# Patient Record
Sex: Female | Born: 1965
Health system: Southern US, Community
[De-identification: ages and names within clinical notes are randomized; demographics above are authoritative.]

## PROBLEM LIST (undated history)

## (undated) DIAGNOSIS — E229 Hyperfunction of pituitary gland, unspecified: Secondary | ICD-10-CM | POA: Insufficient documentation

## (undated) DIAGNOSIS — B019 Varicella without complication: Secondary | ICD-10-CM

## (undated) DIAGNOSIS — E785 Hyperlipidemia, unspecified: Secondary | ICD-10-CM

## (undated) DIAGNOSIS — G4733 Obstructive sleep apnea (adult) (pediatric): Secondary | ICD-10-CM

## (undated) DIAGNOSIS — I5189 Other ill-defined heart diseases: Secondary | ICD-10-CM

## (undated) DIAGNOSIS — I10 Essential (primary) hypertension: Secondary | ICD-10-CM

## (undated) DIAGNOSIS — G479 Sleep disorder, unspecified: Secondary | ICD-10-CM | POA: Insufficient documentation

## (undated) DIAGNOSIS — D649 Anemia, unspecified: Secondary | ICD-10-CM

## (undated) DIAGNOSIS — E559 Vitamin D deficiency, unspecified: Secondary | ICD-10-CM | POA: Insufficient documentation

## (undated) DIAGNOSIS — D332 Benign neoplasm of brain, unspecified: Secondary | ICD-10-CM

## (undated) DIAGNOSIS — D353 Benign neoplasm of craniopharyngeal duct: Secondary | ICD-10-CM

## (undated) DIAGNOSIS — D352 Benign neoplasm of pituitary gland: Secondary | ICD-10-CM | POA: Insufficient documentation

## (undated) HISTORY — DX: Varicella without complication: B01.9

## (undated) HISTORY — DX: Obstructive sleep apnea (adult) (pediatric): G47.33

## (undated) HISTORY — DX: Hyperlipidemia, unspecified: E78.5

## (undated) HISTORY — DX: Other ill-defined heart diseases: I51.89

## (undated) HISTORY — DX: Benign neoplasm of brain, unspecified: D33.2

## (undated) HISTORY — PX: WISDOM TOOTH EXTRACTION: SHX21

---

## 1994-02-01 DIAGNOSIS — D332 Benign neoplasm of brain, unspecified: Secondary | ICD-10-CM

## 1994-02-01 HISTORY — DX: Benign neoplasm of brain, unspecified: D33.2

## 1994-02-01 HISTORY — PX: TRANSPHENOIDAL PITUITARY RESECTION: SHX2572

## 1994-02-01 HISTORY — PX: CRANIECTOMY / CRANIOTOMY FOR EXCISION OF BRAIN TUMOR: SUR320

## 2011-08-10 DIAGNOSIS — D352 Benign neoplasm of pituitary gland: Secondary | ICD-10-CM | POA: Insufficient documentation

## 2015-11-10 DIAGNOSIS — E559 Vitamin D deficiency, unspecified: Secondary | ICD-10-CM | POA: Diagnosis not present

## 2015-11-10 DIAGNOSIS — D352 Benign neoplasm of pituitary gland: Secondary | ICD-10-CM | POA: Diagnosis not present

## 2015-11-14 DIAGNOSIS — D352 Benign neoplasm of pituitary gland: Secondary | ICD-10-CM | POA: Diagnosis not present

## 2015-11-14 DIAGNOSIS — E221 Hyperprolactinemia: Secondary | ICD-10-CM | POA: Diagnosis not present

## 2015-11-14 DIAGNOSIS — D353 Benign neoplasm of craniopharyngeal duct: Secondary | ICD-10-CM | POA: Diagnosis not present

## 2015-11-14 DIAGNOSIS — E559 Vitamin D deficiency, unspecified: Secondary | ICD-10-CM | POA: Diagnosis not present

## 2016-02-09 DIAGNOSIS — K080 Exfoliation of teeth due to systemic causes: Secondary | ICD-10-CM | POA: Diagnosis not present

## 2016-09-24 DIAGNOSIS — D352 Benign neoplasm of pituitary gland: Secondary | ICD-10-CM | POA: Diagnosis not present

## 2016-09-24 DIAGNOSIS — E229 Hyperfunction of pituitary gland, unspecified: Secondary | ICD-10-CM | POA: Diagnosis not present

## 2016-09-24 DIAGNOSIS — E559 Vitamin D deficiency, unspecified: Secondary | ICD-10-CM | POA: Diagnosis not present

## 2016-09-30 DIAGNOSIS — E221 Hyperprolactinemia: Secondary | ICD-10-CM | POA: Diagnosis not present

## 2016-09-30 DIAGNOSIS — D353 Benign neoplasm of craniopharyngeal duct: Secondary | ICD-10-CM | POA: Diagnosis not present

## 2016-09-30 DIAGNOSIS — D352 Benign neoplasm of pituitary gland: Secondary | ICD-10-CM | POA: Diagnosis not present

## 2016-09-30 DIAGNOSIS — E559 Vitamin D deficiency, unspecified: Secondary | ICD-10-CM | POA: Diagnosis not present

## 2016-11-18 DIAGNOSIS — K080 Exfoliation of teeth due to systemic causes: Secondary | ICD-10-CM | POA: Diagnosis not present

## 2017-02-28 DIAGNOSIS — D352 Benign neoplasm of pituitary gland: Secondary | ICD-10-CM | POA: Diagnosis not present

## 2017-02-28 DIAGNOSIS — E221 Hyperprolactinemia: Secondary | ICD-10-CM | POA: Diagnosis not present

## 2017-03-04 DIAGNOSIS — I10 Essential (primary) hypertension: Secondary | ICD-10-CM | POA: Diagnosis not present

## 2017-03-04 DIAGNOSIS — D352 Benign neoplasm of pituitary gland: Secondary | ICD-10-CM | POA: Diagnosis not present

## 2017-03-04 DIAGNOSIS — E559 Vitamin D deficiency, unspecified: Secondary | ICD-10-CM | POA: Diagnosis not present

## 2017-03-04 DIAGNOSIS — E221 Hyperprolactinemia: Secondary | ICD-10-CM | POA: Diagnosis not present

## 2017-06-07 ENCOUNTER — Ambulatory Visit: Payer: Federal, State, Local not specified - PPO | Admitting: Nurse Practitioner

## 2017-06-07 ENCOUNTER — Encounter: Payer: Self-pay | Admitting: Nurse Practitioner

## 2017-06-07 ENCOUNTER — Other Ambulatory Visit (HOSPITAL_COMMUNITY)
Admission: RE | Admit: 2017-06-07 | Discharge: 2017-06-07 | Disposition: A | Discharge status: Home / Self Care | Payer: Federal, State, Local not specified - PPO | Source: Ambulatory Visit | Attending: Nurse Practitioner | Admitting: Nurse Practitioner

## 2017-06-07 VITALS — BP 136/96 | HR 80 | Temp 97.5°F | Ht 63.0 in | Wt 308.0 lb

## 2017-06-07 DIAGNOSIS — Z0001 Encounter for general adult medical examination with abnormal findings: Secondary | ICD-10-CM | POA: Diagnosis not present

## 2017-06-07 DIAGNOSIS — Z136 Encounter for screening for cardiovascular disorders: Secondary | ICD-10-CM

## 2017-06-07 DIAGNOSIS — R030 Elevated blood-pressure reading, without diagnosis of hypertension: Secondary | ICD-10-CM | POA: Insufficient documentation

## 2017-06-07 DIAGNOSIS — Z13220 Encounter for screening for lipoid disorders: Secondary | ICD-10-CM

## 2017-06-07 DIAGNOSIS — Z124 Encounter for screening for malignant neoplasm of cervix: Secondary | ICD-10-CM

## 2017-06-07 DIAGNOSIS — Z114 Encounter for screening for human immunodeficiency virus [HIV]: Secondary | ICD-10-CM | POA: Diagnosis not present

## 2017-06-07 DIAGNOSIS — Z1211 Encounter for screening for malignant neoplasm of colon: Secondary | ICD-10-CM

## 2017-06-07 DIAGNOSIS — Z1231 Encounter for screening mammogram for malignant neoplasm of breast: Secondary | ICD-10-CM

## 2017-06-07 LAB — COMPREHENSIVE METABOLIC PANEL
ALBUMIN: 3.5 g/dL (ref 3.5–5.2)
ALT: 12 U/L (ref 0–35)
AST: 15 U/L (ref 0–37)
Alkaline Phosphatase: 80 U/L (ref 39–117)
BILIRUBIN TOTAL: 0.8 mg/dL (ref 0.2–1.2)
BUN: 7 mg/dL (ref 6–23)
CALCIUM: 9 mg/dL (ref 8.4–10.5)
CO2: 25 meq/L (ref 19–32)
CREATININE: 0.73 mg/dL (ref 0.40–1.20)
Chloride: 104 mEq/L (ref 96–112)
GFR: 107.52 mL/min (ref >60.00)
Glucose, Bld: 88 mg/dL (ref 70–99)
Potassium: 3.6 mEq/L (ref 3.5–5.1)
Sodium: 138 mEq/L (ref 135–145)
Total Protein: 7.2 g/dL (ref 6.0–8.3)

## 2017-06-07 LAB — CBC
HCT: 39.2 % (ref 36.0–46.0)
HEMOGLOBIN: 12.5 g/dL (ref 12.0–15.0)
MCHC: 31.8 g/dL (ref 30.0–36.0)
MCV: 88 fl (ref 78.0–100.0)
PLATELETS: 379 10*3/uL (ref 150.0–400.0)
RBC: 4.45 Mil/uL (ref 3.87–5.11)
RDW: 16.7 % — ABNORMAL HIGH (ref 11.5–15.5)
WBC: 12.8 10*3/uL — ABNORMAL HIGH (ref 4.0–10.5)

## 2017-06-07 LAB — LIPID PANEL
CHOLESTEROL: 182 mg/dL (ref 0–200)
HDL: 54.2 mg/dL (ref >39.00)
LDL CALC: 111 mg/dL — AB (ref 0–99)
NonHDL: 127.65
TRIGLYCERIDES: 83 mg/dL (ref 0.0–149.0)
Total CHOL/HDL Ratio: 3
VLDL: 16.6 mg/dL (ref 0.0–40.0)

## 2017-06-07 LAB — TSH: TSH: 1.06 u[IU]/mL (ref 0.35–4.50)

## 2017-06-07 NOTE — Progress Notes (Signed)
Subjective:    Patient ID: Sherry [name], female    DOB: 10-Nov-1965, 52 y.o.   MRN: [id number]  Patient presents today for complete physical  HPI   weight gain over the last 2years. Does not check BP at home.   Hx of elevated BP: Does not check BP at home. Does not maintain DASH diet.  Immunizations: (TDAP, Hep C screen, Pneumovax, Influenza, zoster)  Health Maintenance  Topic Date Due  . HIV Screening  02/04/1980  . Pap Smear  02/03/1986  . Mammogram  02/04/2015  . Colon Cancer Screening  02/04/2015  . Flu Shot  09/01/2017  . Tetanus Vaccine  04/02/2018   Diet:vegetarian (no red beef due to inability to digest meat) , reports food sensitivity but no formal testing done in past  Weight:  Wt Readings from Last 3 Encounters:  06/07/17 (!) 308 lb (139.7 kg)   Exercise:none.  Fall Risk: Fall Risk  06/07/2017  Falls in the past year? No   Home Safety:home with husband.  Depression/Suicide: Depression screen [facility] 2/9 06/07/2017  Decreased Interest 0  Down, Depressed, Hopeless 0  PHQ - 2 Score 0   Vision:needed, will schedule, chronic dry eyes, use of omega 3  Dental: done every 86months.  Advanced Directive: Advanced Directives 06/07/2017  Does Patient Have a Medical Advance Directive? Yes  Type of Paramedic of [place];Living will  Copy of [place] in Chart? No - copy requested   Medications and allergies reviewed with patient and updated if appropriate.  Patient Active Problem List   Diagnosis Date Noted  . Elevated BP without diagnosis of hypertension 06/07/2017  . Benign neoplasm of pituitary gland and craniopharyngeal duct ([place]) 02/01/1898  . Hyperpituitarism ([place]) 02/01/1898    Current Outpatient Medications on File Prior to Visit  Medication Sig Dispense Refill  . bromocriptine (PARLODEL) 2.5 MG tablet TAKE ONE (1) TABLET BY MOUTH ONCE DAILY    . Cholecalciferol (VITAMIN D3) 5000 units TABS Take by mouth.      . Multiple Vitamins-Minerals (MULTIVITAMIN ADULT PO) Take by mouth.    . Omega-3 Fatty Acids (FISH OIL CONCENTRATE) 300 MG CAPS Take 500 mg by mouth.     No current facility-administered medications on file prior to visit.    Past Medical History:  Diagnosis Date  . Brain tumor (benign) ([place]) 1996   benign pituitary adenoma  . Chickenpox    Social History   Socioeconomic History  . Marital status: Married    Spouse name: Not on file  . Number of children: 0  . Years of education: Not on file  . Highest education level: Not on file  Occupational History  . Not on file  Social Needs  . Financial resource strain: Not on file  . Food insecurity:    Worry: Not on file    Inability: Not on file  . Transportation needs:    Medical: Not on file    Non-medical: Not on file  Tobacco Use  . Smoking status: Never Smoker  . Smokeless tobacco: Never Used  Substance and Sexual Activity  . Alcohol use: Never    Frequency: Never  . Drug use: Never  . Sexual activity: Yes  Lifestyle  . Physical activity:    Days per week: Not on file    Minutes per session: Not on file  . Stress: Not on file  Relationships  . Social connections:    Talks on phone: Not on file  Gets together: Not on file    Attends religious service: Not on file    Active member of club or organization: Not on file    Attends meetings of clubs or organizations: Not on file    Relationship status: Not on file  Other Topics Concern  . Not on file  Social History Narrative  . Not on file    Family History  Problem Relation Age of Onset  . Heart attack Father 55       result from blood clot  . Pulmonary embolism Father         Review of Systems  Constitutional: Negative for fever, malaise/fatigue and weight loss.  HENT: Negative for congestion and sore throat.   Eyes:       Negative for visual changes  Respiratory: Negative for cough and shortness of breath.   Cardiovascular: Negative for chest  pain, palpitations and leg swelling.  Gastrointestinal: Negative for blood in stool, constipation, diarrhea and heartburn.  Genitourinary: Negative for dysuria, frequency and urgency.  Musculoskeletal: Negative for falls, joint pain and myalgias.  Skin: Negative for rash.  Neurological: Negative for dizziness, sensory change and headaches.  Endo/Heme/Allergies: Does not bruise/bleed easily.  Psychiatric/Behavioral: Negative for depression, substance abuse and suicidal ideas. The patient is not nervous/anxious.     Objective:   Vitals:   06/07/17 1011  BP: (!) 136/96  Pulse: 80  Temp: (!) 97.5 F (36.4 C)  SpO2: 100%    Body mass index is 54.56 kg/m.   Physical Examination:  Physical Exam  Constitutional: She is oriented to person, place, and time. No distress.  HENT:  Right Ear: Tympanic membrane, external ear and ear canal normal.  Left Ear: Tympanic membrane, external ear and ear canal normal.  Nose: Nose normal.  Mouth/Throat: Uvula is midline, oropharynx is clear and moist and mucous membranes are normal. Normal dentition. No oropharyngeal exudate.  Eyes: Pupils are equal, round, and reactive to light. Conjunctivae and EOM are normal. No scleral icterus.  Neck: Normal range of motion. Neck supple. No thyromegaly present.  Cardiovascular: Normal rate, regular rhythm, normal heart sounds and intact distal pulses.  Pulmonary/Chest: Effort normal and breath sounds normal. She exhibits no mass and no tenderness. Right breast exhibits no inverted nipple, no mass, no nipple discharge and no skin change. Left breast exhibits no inverted nipple, no mass, no nipple discharge and no skin change. Breasts are symmetrical.  Abdominal: Soft. Bowel sounds are normal. She exhibits no distension. There is no tenderness.  Genitourinary: Rectum normal and vagina normal. Pelvic exam was performed with patient supine. There is no tenderness on the right labia. There is no tenderness on the left  labia. Cervix exhibits no motion tenderness. No vaginal discharge found.  Musculoskeletal: Normal range of motion. She exhibits no edema or tenderness.  Lymphadenopathy:    She has no cervical adenopathy. No inguinal adenopathy noted on the right or left side.  Neurological: She is alert and oriented to person, place, and time.  Skin: Skin is warm and dry.  Psychiatric: She has a normal mood and affect. Judgment normal.  Vitals reviewed.   ASSESSMENT and PLAN:  Keymiah was seen today for establish care.  Diagnoses and all orders for this visit:  Encounter for preventative adult health care exam with abnormal findings -     CBC -     Comprehensive metabolic panel -     TSH -     Lipid panel -  MM 3D SCREEN BREAST BILATERAL; Future -     Cytology - PAP -     HIV antibody  Encounter for lipid screening for cardiovascular disease -     Lipid panel  Breast cancer screening by mammogram -     MM 3D SCREEN BREAST BILATERAL; Future  Cervical cancer screening -     Cytology - PAP  Encounter for screening for human immunodeficiency virus (HIV) -     HIV antibody  Colon cancer screening -     Ambulatory referral to Gastroenterology  Elevated BP without diagnosis of hypertension   No problem-specific Assessment & Plan notes found for this encounter.     Follow up: Return in about 2 months (around 08/07/2017) for elevated BP re eval.  Wilfred Lacy, NP

## 2017-06-07 NOTE — Patient Instructions (Addendum)
Check BP 3 times a week (morning) and record. Bring BP readings to next office visit.  Return to office sooner if BP>140/80 for more than 2days.  Maintain DASH diet  Go to lab for blood draw.  You will be contacted to schedule mammogram and colonoscpy.  DASH Eating Plan DASH stands for "Dietary Approaches to Stop Hypertension." The DASH eating plan is a healthy eating plan that has been shown to reduce high blood pressure (hypertension). It may also reduce your risk for type 2 diabetes, heart disease, and stroke. The DASH eating plan may also help with weight loss. What are tips for following this plan? General guidelines  Avoid eating more than 2,300 mg (milligrams) of salt (sodium) a day. If you have hypertension, you may need to reduce your sodium intake to 1,500 mg a day.  Limit alcohol intake to no more than 1 drink a day for nonpregnant women and 2 drinks a day for men. One drink equals 12 oz of beer, 5 oz of wine, or 1 oz of hard liquor.  Work with your health care provider to maintain a healthy body weight or to lose weight. Ask what an ideal weight is for you.  Get at least 30 minutes of exercise that causes your heart to beat faster (aerobic exercise) most days of the week. Activities may include walking, swimming, or biking.  Work with your health care provider or diet and nutrition specialist (dietitian) to adjust your eating plan to your individual calorie needs. Reading food labels  Check food labels for the amount of sodium per serving. Choose foods with less than 5 percent of the Daily Value of sodium. Generally, foods with less than 300 mg of sodium per serving fit into this eating plan.  To find whole grains, look for the word "whole" as the first word in the ingredient list. Shopping  Buy products labeled as "low-sodium" or "no salt added."  Buy fresh foods. Avoid canned foods and premade or frozen meals. Cooking  Avoid adding salt when cooking. Use salt-free  seasonings or herbs instead of table salt or sea salt. Check with your health care provider or pharmacist before using salt substitutes.  Do not fry foods. Cook foods using healthy methods such as baking, boiling, grilling, and broiling instead.  Cook with heart-healthy oils, such as olive, canola, soybean, or sunflower oil. Meal planning   Eat a balanced diet that includes: ? 5 or more servings of fruits and vegetables each day. At each meal, try to fill half of your plate with fruits and vegetables. ? Up to 6-8 servings of whole grains each day. ? Less than 6 oz of lean meat, poultry, or fish each day. A 3-oz serving of meat is about the same size as a deck of cards. One egg equals 1 oz. ? 2 servings of low-fat dairy each day. ? A serving of nuts, seeds, or beans 5 times each week. ? Heart-healthy fats. Healthy fats called Omega-3 fatty acids are found in foods such as flaxseeds and coldwater fish, like sardines, salmon, and mackerel.  Limit how much you eat of the following: ? Canned or prepackaged foods. ? Food that is high in trans fat, such as fried foods. ? Food that is high in saturated fat, such as fatty meat. ? Sweets, desserts, sugary drinks, and other foods with added sugar. ? Full-fat dairy products.  Do not salt foods before eating.  Try to eat at least 2 vegetarian meals each week.  Eat  more home-cooked food and less restaurant, buffet, and fast food.  When eating at a restaurant, ask that your food be prepared with less salt or no salt, if possible. What foods are recommended? The items listed may not be a complete list. Talk with your dietitian about what dietary choices are best for you. Grains Whole-grain or whole-wheat bread. Whole-grain or whole-wheat pasta. Brown rice. [place]. Bulgur. Whole-grain and low-sodium cereals. Pita bread. Low-fat, low-sodium crackers. Whole-wheat flour tortillas. Vegetables Fresh or frozen vegetables (raw, steamed, roasted,  or grilled). Low-sodium or reduced-sodium tomato and vegetable juice. Low-sodium or reduced-sodium tomato sauce and tomato paste. Low-sodium or reduced-sodium canned vegetables. Fruits All fresh, dried, or frozen fruit. Canned fruit in natural juice (without added sugar). Meat and other protein foods Skinless chicken or [place]. Ground chicken or [place]. Pork with fat trimmed off. Fish and seafood. Egg whites. Dried beans, peas, or lentils. Unsalted nuts, nut butters, and seeds. Unsalted canned beans. Lean cuts of beef with fat trimmed off. Low-sodium, lean deli meat. Dairy Low-fat (1%) or fat-free (skim) milk. Fat-free, low-fat, or reduced-fat cheeses. Nonfat, low-sodium ricotta or cottage cheese. Low-fat or nonfat yogurt. Low-fat, low-sodium cheese. Fats and oils Soft margarine without trans fats. Vegetable oil. Low-fat, reduced-fat, or light mayonnaise and salad dressings (reduced-sodium). Canola, safflower, olive, soybean, and sunflower oils. Avocado. Seasoning and other foods Herbs. Spices. Seasoning mixes without salt. Unsalted popcorn and pretzels. Fat-free sweets. What foods are not recommended? The items listed may not be a complete list. Talk with your dietitian about what dietary choices are best for you. Grains Baked goods made with fat, such as croissants, muffins, or some breads. Dry pasta or rice meal packs. Vegetables Creamed or fried vegetables. Vegetables in a cheese sauce. Regular canned vegetables (not low-sodium or reduced-sodium). Regular canned tomato sauce and paste (not low-sodium or reduced-sodium). Regular tomato and vegetable juice (not low-sodium or reduced-sodium). Angie Fava. Olives. Fruits Canned fruit in a light or heavy syrup. Fried fruit. Fruit in cream or butter sauce. Meat and other protein foods Fatty cuts of meat. Ribs. Fried meat. Berniece Salines. Sausage. Bologna and other processed lunch meats. Salami. Fatback. Hotdogs. Bratwurst. Salted nuts and seeds. Canned beans  with added salt. Canned or smoked fish. Whole eggs or egg yolks. Chicken or [place] with skin. Dairy Whole or 2% milk, cream, and half-and-half. Whole or full-fat cream cheese. Whole-fat or sweetened yogurt. Full-fat cheese. Nondairy creamers. Whipped toppings. Processed cheese and cheese spreads. Fats and oils Butter. Stick margarine. Lard. Shortening. Ghee. Bacon fat. Tropical oils, such as coconut, palm kernel, or palm oil. Seasoning and other foods Salted popcorn and pretzels. Onion salt, garlic salt, seasoned salt, table salt, and sea salt. Worcestershire sauce. Tartar sauce. Barbecue sauce. Teriyaki sauce. Soy sauce, including reduced-sodium. Steak sauce. Canned and packaged gravies. Fish sauce. Oyster sauce. Cocktail sauce. Horseradish that you find on the shelf. Ketchup. Mustard. Meat flavorings and tenderizers. Bouillon cubes. Hot sauce and Tabasco sauce. Premade or packaged marinades. Premade or packaged taco seasonings. Relishes. Regular salad dressings. Where to find more information:  National Heart, Lung, and [place]: https://wilson-eaton.com/  American Heart Association: www.heart.org Summary  The DASH eating plan is a healthy eating plan that has been shown to reduce high blood pressure (hypertension). It may also reduce your risk for type 2 diabetes, heart disease, and stroke.  With the DASH eating plan, you should limit salt (sodium) intake to 2,300 mg a day. If you have hypertension, you may need to reduce your sodium intake to  1,500 mg a day.  When on the DASH eating plan, aim to eat more fresh fruits and vegetables, whole grains, lean proteins, low-fat dairy, and heart-healthy fats.  Work with your health care provider or diet and nutrition specialist (dietitian) to adjust your eating plan to your individual calorie needs. This information is not intended to replace advice given to you by your health care provider. Make sure you discuss any questions you have with your health  care provider. Document Released: 01/07/2011 Document Revised: 01/12/2016 Document Reviewed: 01/12/2016 Elsevier Interactive Patient Education  [employer].

## 2017-06-08 LAB — HIV ANTIBODY (ROUTINE TESTING W REFLEX): HIV: NONREACTIVE

## 2017-06-09 LAB — CYTOLOGY - PAP
DIAGNOSIS: NEGATIVE
HPV (WINDOPATH): NOT DETECTED

## 2017-08-09 ENCOUNTER — Ambulatory Visit: Payer: Federal, State, Local not specified - PPO | Admitting: Nurse Practitioner

## 2017-08-25 ENCOUNTER — Other Ambulatory Visit: Payer: Self-pay | Admitting: Nurse Practitioner

## 2017-08-25 DIAGNOSIS — Z1231 Encounter for screening mammogram for malignant neoplasm of breast: Secondary | ICD-10-CM

## 2017-08-30 ENCOUNTER — Ambulatory Visit: Payer: Federal, State, Local not specified - PPO | Admitting: Nurse Practitioner

## 2017-09-26 ENCOUNTER — Ambulatory Visit: Payer: Federal, State, Local not specified - PPO | Admitting: Nurse Practitioner

## 2017-09-26 ENCOUNTER — Encounter: Payer: Self-pay | Admitting: Nurse Practitioner

## 2017-09-26 ENCOUNTER — Telehealth: Payer: Self-pay | Admitting: Nurse Practitioner

## 2017-09-26 VITALS — BP 160/98 | HR 86 | Temp 97.5°F | Ht 63.0 in | Wt 308.0 lb

## 2017-09-26 DIAGNOSIS — D353 Benign neoplasm of craniopharyngeal duct: Secondary | ICD-10-CM | POA: Diagnosis not present

## 2017-09-26 DIAGNOSIS — N926 Irregular menstruation, unspecified: Secondary | ICD-10-CM

## 2017-09-26 DIAGNOSIS — D352 Benign neoplasm of pituitary gland: Secondary | ICD-10-CM | POA: Diagnosis not present

## 2017-09-26 DIAGNOSIS — I10 Essential (primary) hypertension: Secondary | ICD-10-CM

## 2017-09-26 LAB — POCT URINE PREGNANCY: PREG TEST UR: NEGATIVE

## 2017-09-26 MED ORDER — TRIAMTERENE-HCTZ 37.5-25 MG PO TABS: 0.5000 | ORAL_TABLET | Freq: Every day | ORAL | 1 refills | Status: DC

## 2017-09-26 MED ORDER — TRIAMTERENE-HCTZ 37.5-25 MG PO TABS: 1.0000 | ORAL_TABLET | Freq: Every day | ORAL | 1 refills | Status: DC

## 2017-09-26 NOTE — Telephone Encounter (Signed)
Copied from [place] [id number]. Topic: Quick Communication - Rx Refill/Question >> Sep 26, 2017  3:54 PM Nils Flack, [name] wrote: Medication: triamterene-hydrochlorothiazide (MAXZIDE-25) 37.5-25 MG tablet Has the patient contacted their pharmacy? No. (Agent: If no, request that the patient contact the pharmacy for the refill.) (Agent: If yes, when and what did the pharmacy advise?)  Preferred Pharmacy (with phone number or street name): [facility] called - med was called in 2 times with different directions.  First, was scribed in with directions of 1 per day. Then was scribed in with directions of 1/2 per day. Pharm would like clarification  Please call [phone]    Agent: Please be advised that RX refills may take up to 3 business days. We ask that you follow-up with your pharmacy.

## 2017-09-26 NOTE — Telephone Encounter (Signed)
Spoke with the pharmacy, pt need to be on Maxzide  37.5-25 mg to take 0.5 (half) tab daily.

## 2017-09-26 NOTE — Progress Notes (Signed)
Subjective:  Patient ID: Sherry [name], female    DOB: 12-Jul-1965  Age: 52 y.o. MRN: [id number]  CC: Follow-up (2 folow up BP/ forgot the BP reading (running around 160/94). sleeping issue consult, use melatonin PRN but still not helping at times. )  HPI  HTN: Elevated with home readings: 150s-160s/90s. BP Readings from Last 3 Encounters:  09/26/17 (!) 160/98  06/07/17 (!) 136/96   Reviewed past Medical, Social and Family history today.  Outpatient Medications Prior to Visit  Medication Sig Dispense Refill  . bromocriptine (PARLODEL) 2.5 MG tablet TAKE ONE (1) TABLET BY MOUTH ONCE DAILY    . Cholecalciferol (VITAMIN D3) 5000 units TABS Take by mouth.    . MELATONIN PO Take by mouth. Help with sleep PRN    . Multiple Vitamins-Minerals (MULTIVITAMIN ADULT PO) Take by mouth.    . Omega-3 Fatty Acids (FISH OIL CONCENTRATE) 300 MG CAPS Take 500 mg by mouth.     No facility-administered medications prior to visit.     ROS Review of Systems  Constitutional: Negative for malaise/fatigue.  HENT: Negative for hearing loss.   Eyes: Negative for blurred vision.  Respiratory: Negative for cough and sputum production.   Cardiovascular: Positive for leg swelling. Negative for chest pain and palpitations.  Musculoskeletal: Negative for falls.  Neurological: Positive for dizziness. Negative for sensory change and headaches.  Psychiatric/Behavioral: Negative for substance abuse. The patient does not have insomnia.      Objective:  BP (!) 160/98   Pulse 86   Temp (!) 97.5 F (36.4 C) (Oral)   Ht 5\' 3"  (1.6 m)   Wt (!) 308 lb (139.7 kg)   LMP 09/18/2017   SpO2 96%   BMI 54.56 kg/m   BP Readings from Last 3 Encounters:  09/26/17 (!) 160/98  06/07/17 (!) 136/96    Wt Readings from Last 3 Encounters:  09/26/17 (!) 308 lb (139.7 kg)  06/07/17 (!) 308 lb (139.7 kg)    Physical Exam  Constitutional: She appears well-developed and well-nourished.  Neck: Normal range of motion.  Neck supple. No JVD present. No thyromegaly present.  Cardiovascular: Normal rate, regular rhythm and intact distal pulses. Exam reveals gallop.  Pulmonary/Chest: Effort normal and breath sounds normal.  Musculoskeletal: She exhibits edema.  Psychiatric: She has a normal mood and affect. Her behavior is normal.  Vitals reviewed.   Lab Results  Component Value Date   WBC 12.8 (H) 06/07/2017   HGB 12.5 06/07/2017   HCT 39.2 06/07/2017   PLT 379.0 06/07/2017   GLUCOSE 88 06/07/2017   CHOL 182 06/07/2017   TRIG 83.0 06/07/2017   HDL 54.20 06/07/2017   LDLCALC 111 (H) 06/07/2017   ALT 12 06/07/2017   AST 15 06/07/2017   NA 138 06/07/2017   K 3.6 06/07/2017   CL 104 06/07/2017   CREATININE 0.73 06/07/2017   BUN 7 06/07/2017   CO2 25 06/07/2017   TSH 1.06 06/07/2017     Assessment & Plan:   Sherry [name] was seen today for follow-up.  Diagnoses and all orders for this visit:  Essential hypertension -     Discontinue: triamterene-hydrochlorothiazide (MAXZIDE-25) 37.5-25 MG tablet; Take 1 tablet by mouth daily. -     triamterene-hydrochlorothiazide (MAXZIDE-25) 37.5-25 MG tablet; Take 0.5 tablets by mouth daily.  Irregular menstrual cycle -     POCT urine pregnancy   I have changed Sherry [name] triamterene-hydrochlorothiazide. I am also having her maintain her bromocriptine, Vitamin D3, FISH OIL CONCENTRATE, Multiple Vitamins-Minerals (MULTIVITAMIN  ADULT PO), and MELATONIN PO.  Meds ordered this encounter  Medications  . DISCONTD: triamterene-hydrochlorothiazide (MAXZIDE-25) 37.5-25 MG tablet    Sig: Take 1 tablet by mouth daily.    Dispense:  30 tablet    Refill:  1    Order Specific Question:   Supervising Provider    Answer:   Lucille Passy [[id number]]  . triamterene-hydrochlorothiazide (MAXZIDE-25) 37.5-25 MG tablet    Sig: Take 0.5 tablets by mouth daily.    Dispense:  30 tablet    Refill:  1    Order Specific Question:   Supervising Provider    Answer:   Lucille Passy [[id number]]    Follow-up: Return in about 4 weeks (around 10/24/2017) for HTN.  Wilfred Lacy, NP

## 2017-09-26 NOTE — Patient Instructions (Signed)
DASH Eating Plan DASH stands for "Dietary Approaches to Stop Hypertension." The DASH eating plan is a healthy eating plan that has been shown to reduce high blood pressure (hypertension). It may also reduce your risk for type 2 diabetes, heart disease, and stroke. The DASH eating plan may also help with weight loss. What are tips for following this plan? General guidelines  Avoid eating more than 2,300 mg (milligrams) of salt (sodium) a day. If you have hypertension, you may need to reduce your sodium intake to 1,500 mg a day.  Limit alcohol intake to no more than 1 drink a day for nonpregnant women and 2 drinks a day for men. One drink equals 12 oz of beer, 5 oz of wine, or 1 oz of hard liquor.  Work with your health care provider to maintain a healthy body weight or to lose weight. Ask what an ideal weight is for you.  Get at least 30 minutes of exercise that causes your heart to beat faster (aerobic exercise) most days of the week. Activities may include walking, swimming, or biking.  Work with your health care provider or diet and nutrition specialist (dietitian) to adjust your eating plan to your individual calorie needs. Reading food labels  Check food labels for the amount of sodium per serving. Choose foods with less than 5 percent of the Daily Value of sodium. Generally, foods with less than 300 mg of sodium per serving fit into this eating plan.  To find whole grains, look for the word "whole" as the first word in the ingredient list. Shopping  Buy products labeled as "low-sodium" or "no salt added."  Buy fresh foods. Avoid canned foods and premade or frozen meals. Cooking  Avoid adding salt when cooking. Use salt-free seasonings or herbs instead of table salt or sea salt. Check with your health care provider or pharmacist before using salt substitutes.  Do not fry foods. Cook foods using healthy methods such as baking, boiling, grilling, and broiling instead.  Cook with  heart-healthy oils, such as olive, canola, soybean, or sunflower oil. Meal planning   Eat a balanced diet that includes: ? 5 or more servings of fruits and vegetables each day. At each meal, try to fill half of your plate with fruits and vegetables. ? Up to 6-8 servings of whole grains each day. ? Less than 6 oz of lean meat, poultry, or fish each day. A 3-oz serving of meat is about the same size as a deck of cards. One egg equals 1 oz. ? 2 servings of low-fat dairy each day. ? A serving of nuts, seeds, or beans 5 times each week. ? Heart-healthy fats. Healthy fats called Omega-3 fatty acids are found in foods such as flaxseeds and coldwater fish, like sardines, salmon, and mackerel.  Limit how much you eat of the following: ? Canned or prepackaged foods. ? Food that is high in trans fat, such as fried foods. ? Food that is high in saturated fat, such as fatty meat. ? Sweets, desserts, sugary drinks, and other foods with added sugar. ? Full-fat dairy products.  Do not salt foods before eating.  Try to eat at least 2 vegetarian meals each week.  Eat more home-cooked food and less restaurant, buffet, and fast food.  When eating at a restaurant, ask that your food be prepared with less salt or no salt, if possible. What foods are recommended? The items listed may not be a complete list. Talk with your dietitian about what   dietary choices are best for you. Grains Whole-grain or whole-wheat bread. Whole-grain or whole-wheat pasta. Brown rice. Oatmeal. Quinoa. Bulgur. Whole-grain and low-sodium cereals. Pita bread. Low-fat, low-sodium crackers. Whole-wheat flour tortillas. Vegetables Fresh or frozen vegetables (raw, steamed, roasted, or grilled). Low-sodium or reduced-sodium tomato and vegetable juice. Low-sodium or reduced-sodium tomato sauce and tomato paste. Low-sodium or reduced-sodium canned vegetables. Fruits All fresh, dried, or frozen fruit. Canned fruit in natural juice (without  added sugar). Meat and other protein foods Skinless chicken or turkey. Ground chicken or turkey. Pork with fat trimmed off. Fish and seafood. Egg whites. Dried beans, peas, or lentils. Unsalted nuts, nut butters, and seeds. Unsalted canned beans. Lean cuts of beef with fat trimmed off. Low-sodium, lean deli meat. Dairy Low-fat (1%) or fat-free (skim) milk. Fat-free, low-fat, or reduced-fat cheeses. Nonfat, low-sodium ricotta or cottage cheese. Low-fat or nonfat yogurt. Low-fat, low-sodium cheese. Fats and oils Soft margarine without trans fats. Vegetable oil. Low-fat, reduced-fat, or light mayonnaise and salad dressings (reduced-sodium). Canola, safflower, olive, soybean, and sunflower oils. Avocado. Seasoning and other foods Herbs. Spices. Seasoning mixes without salt. Unsalted popcorn and pretzels. Fat-free sweets. What foods are not recommended? The items listed may not be a complete list. Talk with your dietitian about what dietary choices are best for you. Grains Baked goods made with fat, such as croissants, muffins, or some breads. Dry pasta or rice meal packs. Vegetables Creamed or fried vegetables. Vegetables in a cheese sauce. Regular canned vegetables (not low-sodium or reduced-sodium). Regular canned tomato sauce and paste (not low-sodium or reduced-sodium). Regular tomato and vegetable juice (not low-sodium or reduced-sodium). Pickles. Olives. Fruits Canned fruit in a light or heavy syrup. Fried fruit. Fruit in cream or butter sauce. Meat and other protein foods Fatty cuts of meat. Ribs. Fried meat. Bacon. Sausage. Bologna and other processed lunch meats. Salami. Fatback. Hotdogs. Bratwurst. Salted nuts and seeds. Canned beans with added salt. Canned or smoked fish. Whole eggs or egg yolks. Chicken or turkey with skin. Dairy Whole or 2% milk, cream, and half-and-half. Whole or full-fat cream cheese. Whole-fat or sweetened yogurt. Full-fat cheese. Nondairy creamers. Whipped toppings.  Processed cheese and cheese spreads. Fats and oils Butter. Stick margarine. Lard. Shortening. Ghee. Bacon fat. Tropical oils, such as coconut, palm kernel, or palm oil. Seasoning and other foods Salted popcorn and pretzels. Onion salt, garlic salt, seasoned salt, table salt, and sea salt. Worcestershire sauce. Tartar sauce. Barbecue sauce. Teriyaki sauce. Soy sauce, including reduced-sodium. Steak sauce. Canned and packaged gravies. Fish sauce. Oyster sauce. Cocktail sauce. Horseradish that you find on the shelf. Ketchup. Mustard. Meat flavorings and tenderizers. Bouillon cubes. Hot sauce and Tabasco sauce. Premade or packaged marinades. Premade or packaged taco seasonings. Relishes. Regular salad dressings. Where to find more information:  National Heart, Lung, and Blood Institute: www.nhlbi.nih.gov  American Heart Association: www.heart.org Summary  The DASH eating plan is a healthy eating plan that has been shown to reduce high blood pressure (hypertension). It may also reduce your risk for type 2 diabetes, heart disease, and stroke.  With the DASH eating plan, you should limit salt (sodium) intake to 2,300 mg a day. If you have hypertension, you may need to reduce your sodium intake to 1,500 mg a day.  When on the DASH eating plan, aim to eat more fresh fruits and vegetables, whole grains, lean proteins, low-fat dairy, and heart-healthy fats.  Work with your health care provider or diet and nutrition specialist (dietitian) to adjust your eating plan to your individual   calorie needs. This information is not intended to replace advice given to you by your health care provider. Make sure you discuss any questions you have with your health care provider. Document Released: 01/07/2011 Document Revised: 01/12/2016 Document Reviewed: 01/12/2016 Elsevier Interactive Patient Education  2018 [employer] Inc.  

## 2017-10-14 ENCOUNTER — Ambulatory Visit
Admission: RE | Admit: 2017-10-14 | Discharge: 2017-10-14 | Disposition: A | Discharge status: Home / Self Care | Payer: Federal, State, Local not specified - PPO | Source: Ambulatory Visit | Attending: Nurse Practitioner | Admitting: Nurse Practitioner

## 2017-10-14 DIAGNOSIS — Z1231 Encounter for screening mammogram for malignant neoplasm of breast: Secondary | ICD-10-CM

## 2017-10-18 ENCOUNTER — Other Ambulatory Visit: Payer: Self-pay | Admitting: Nurse Practitioner

## 2017-10-18 DIAGNOSIS — R928 Other abnormal and inconclusive findings on diagnostic imaging of breast: Secondary | ICD-10-CM

## 2017-10-20 ENCOUNTER — Ambulatory Visit
Admission: RE | Admit: 2017-10-20 | Discharge: 2017-10-20 | Disposition: A | Discharge status: Home / Self Care | Payer: Federal, State, Local not specified - PPO | Source: Ambulatory Visit | Attending: Nurse Practitioner | Admitting: Nurse Practitioner

## 2017-10-20 ENCOUNTER — Encounter: Payer: Self-pay | Admitting: Nurse Practitioner

## 2017-10-20 ENCOUNTER — Other Ambulatory Visit: Payer: Self-pay | Admitting: Nurse Practitioner

## 2017-10-20 DIAGNOSIS — R928 Other abnormal and inconclusive findings on diagnostic imaging of breast: Secondary | ICD-10-CM

## 2017-10-20 DIAGNOSIS — N63 Unspecified lump in unspecified breast: Secondary | ICD-10-CM

## 2017-10-20 DIAGNOSIS — N6310 Unspecified lump in the right breast, unspecified quadrant: Secondary | ICD-10-CM | POA: Insufficient documentation

## 2017-10-20 DIAGNOSIS — N6312 Unspecified lump in the right breast, upper inner quadrant: Secondary | ICD-10-CM | POA: Diagnosis not present

## 2017-10-20 DIAGNOSIS — N6311 Unspecified lump in the right breast, upper outer quadrant: Secondary | ICD-10-CM | POA: Diagnosis not present

## 2017-10-26 ENCOUNTER — Ambulatory Visit: Payer: Federal, State, Local not specified - PPO | Admitting: Nurse Practitioner

## 2017-10-26 ENCOUNTER — Other Ambulatory Visit: Payer: Federal, State, Local not specified - PPO

## 2017-10-26 ENCOUNTER — Encounter: Payer: Self-pay | Admitting: Nurse Practitioner

## 2017-10-26 VITALS — BP 128/82 | HR 83 | Temp 97.5°F | Ht 63.0 in | Wt 302.0 lb

## 2017-10-26 DIAGNOSIS — R400 Somnolence: Secondary | ICD-10-CM | POA: Diagnosis not present

## 2017-10-26 DIAGNOSIS — I10 Essential (primary) hypertension: Secondary | ICD-10-CM | POA: Diagnosis not present

## 2017-10-26 DIAGNOSIS — G4700 Insomnia, unspecified: Secondary | ICD-10-CM | POA: Diagnosis not present

## 2017-10-26 DIAGNOSIS — R5383 Other fatigue: Secondary | ICD-10-CM

## 2017-10-26 DIAGNOSIS — K5900 Constipation, unspecified: Secondary | ICD-10-CM

## 2017-10-26 MED ORDER — DOCUSATE SODIUM 100 MG PO CAPS: 100.0000 mg | ORAL_CAPSULE | Freq: Two times a day (BID) | ORAL | 0 refills | Status: DC | PRN

## 2017-10-26 NOTE — Progress Notes (Signed)
Subjective:  Patient ID: Sherry [name], female    DOB: 02/27/1965  Age: 52 y.o. MRN: [id number]  CC: Follow-up (1 mo fu/ feeling fatigue since started new pt. has copy of BP record from home. )  Insomnia  Primary symptoms: fragmented sleep, no sleep disturbance, no difficulty falling asleep, somnolence, frequent awakening, premature morning awakening, malaise/fatigue, no napping.   The current episode started more than one year. The onset quality is gradual. The problem occurs nightly. The problem is unchanged. The symptoms are aggravated by bed partner. How many beverages per day that contain caffeine: 0 - 1.  Types of beverages you drink: coffee and soda. Nothing relieves the symptoms. Treatments tried: melatonin. The treatment provided mild relief. Typical bedtime:  8-10 P.M..  How long after going to bed to you fall asleep: 15-30 minutes.   PMH includes: hypertension, no depression, no family stress or anxiety, no work related stressors, no chronic pain. Prior diagnostic workup includes:  Blood work.   HTN: Improved BP and LE edema. reports increased fatigue with maxzide Home BP: 140s-130s/80s-90s. HR 70-80s Takes maxzide 4hrs apart from bromocriptine Also reports Constipation with maxzide. BP Readings from Last 3 Encounters:  10/26/17 128/82  09/26/17 (!) 160/98  06/07/17 (!) 136/96    Reviewed labs last done 09/26/2017 by endocrinology.  Reviewed past Medical, Social and Family history today.  Outpatient Medications Prior to Visit  Medication Sig Dispense Refill  . bromocriptine (PARLODEL) 2.5 MG tablet TAKE ONE (1) TABLET BY MOUTH ONCE DAILY    . Cholecalciferol (VITAMIN D3) 5000 units TABS Take by mouth.    . MELATONIN PO Take by mouth. Help with sleep PRN    . Multiple Vitamins-Minerals (MULTIVITAMIN ADULT PO) Take by mouth.    . Omega-3 Fatty Acids (FISH OIL CONCENTRATE) 300 MG CAPS Take 500 mg by mouth.    . triamterene-hydrochlorothiazide (MAXZIDE-25) 37.5-25 MG tablet  Take 0.5 tablets by mouth daily. 30 tablet 1   No facility-administered medications prior to visit.     ROS See HPI  Objective:  BP 128/82   Pulse 83   Temp (!) 97.5 F (36.4 C) (Oral)   Ht 5\' 3"  (1.6 m)   Wt (!) 302 lb (137 kg)   SpO2 98%   BMI 53.50 kg/m   BP Readings from Last 3 Encounters:  10/26/17 128/82  09/26/17 (!) 160/98  06/07/17 (!) 136/96    Wt Readings from Last 3 Encounters:  10/26/17 (!) 302 lb (137 kg)  09/26/17 (!) 308 lb (139.7 kg)  06/07/17 (!) 308 lb (139.7 kg)    Physical Exam  Constitutional: She is oriented to person, place, and time. She appears well-developed and well-nourished.  Neck: No thyromegaly present.  Cardiovascular: Normal rate, regular rhythm, normal heart sounds and intact distal pulses.  No murmur heard. Pulmonary/Chest: Effort normal and breath sounds normal.  Musculoskeletal: She exhibits edema.  Neurological: She is alert and oriented to person, place, and time.  Psychiatric: She has a normal mood and affect. Her behavior is normal. Thought content normal.    Lab Results  Component Value Date   WBC 12.8 (H) 06/07/2017   HGB 12.5 06/07/2017   HCT 39.2 06/07/2017   PLT 379.0 06/07/2017   GLUCOSE 88 06/07/2017   CHOL 182 06/07/2017   TRIG 83.0 06/07/2017   HDL 54.20 06/07/2017   LDLCALC 111 (H) 06/07/2017   ALT 12 06/07/2017   AST 15 06/07/2017   NA 138 06/07/2017   K 3.6 06/07/2017  CL 104 06/07/2017   CREATININE 0.73 06/07/2017   BUN 7 06/07/2017   CO2 25 06/07/2017   TSH 1.06 06/07/2017    US Breast Ltd Uni Right Inc Axilla  Result Date: 10/20/2017 CLINICAL DATA:  Screening recall from baseline for a possible right breast masses. EXAM: DIGITAL DIAGNOSTIC RIGHT MAMMOGRAM WITH CAD AND TOMO ULTRASOUND RIGHT BREAST COMPARISON:  Previous exam(s). ACR Breast Density Category b: There are scattered areas of fibroglandular density. FINDINGS: In the upper slightly outer quadrant of the right breast, anterior depth  there are 2 adjacent small oval circumscribed low-density masses. A third similar appearing mass is seen at the approximate 8 to 9 o'clock position in the anterior depth. Mammographic images were processed with CAD. Ultrasound of the right breast at 12 o'clock, 7 cm from the nipple demonstrates 3 adjacent circumscribed hypoechoic masses. One of these masses measures 6 x 3 x 4 mm, another measures 5 x 3 x 5 mm, and the third measures 5 x 2 x 3 mm. In the right breast at 9 o'clock, 4 cm from the nipple there is a reniform hypoechoic mass measuring 9 x 4 x 5 mm compatible with a intramammary lymph node. IMPRESSION: 1. There are 3 probably benign masses in the right breast at 12 o'clock. 2. There is a benign intramammary lymph node in the right breast at 9 o'clock. RECOMMENDATION: Six-month follow-up diagnostic right breast mammogram and ultrasound is recommended. I have discussed the findings and recommendations with the patient. Results were also provided in writing at the conclusion of the visit. If applicable, a reminder letter will be sent to the patient regarding the next appointment. BI-RADS CATEGORY  3: Probably benign. Electronically Signed   By: Ammie Ferrier M.D.   On: 10/20/2017 08:59   Mm Diag Breast Tomo Uni Right  Result Date: 10/20/2017 CLINICAL DATA:  Screening recall from baseline for a possible right breast masses. EXAM: DIGITAL DIAGNOSTIC RIGHT MAMMOGRAM WITH CAD AND TOMO ULTRASOUND RIGHT BREAST COMPARISON:  Previous exam(s). ACR Breast Density Category b: There are scattered areas of fibroglandular density. FINDINGS: In the upper slightly outer quadrant of the right breast, anterior depth there are 2 adjacent small oval circumscribed low-density masses. A third similar appearing mass is seen at the approximate 8 to 9 o'clock position in the anterior depth. Mammographic images were processed with CAD. Ultrasound of the right breast at 12 o'clock, 7 cm from the nipple demonstrates 3 adjacent  circumscribed hypoechoic masses. One of these masses measures 6 x 3 x 4 mm, another measures 5 x 3 x 5 mm, and the third measures 5 x 2 x 3 mm. In the right breast at 9 o'clock, 4 cm from the nipple there is a reniform hypoechoic mass measuring 9 x 4 x 5 mm compatible with a intramammary lymph node. IMPRESSION: 1. There are 3 probably benign masses in the right breast at 12 o'clock. 2. There is a benign intramammary lymph node in the right breast at 9 o'clock. RECOMMENDATION: Six-month follow-up diagnostic right breast mammogram and ultrasound is recommended. I have discussed the findings and recommendations with the patient. Results were also provided in writing at the conclusion of the visit. If applicable, a reminder letter will be sent to the patient regarding the next appointment. BI-RADS CATEGORY  3: Probably benign. Electronically Signed   By: Ammie Ferrier M.D.   On: 10/20/2017 08:59    Assessment & Plan:   Pippa was seen today for follow-up.  Diagnoses and all orders  for this visit:  Essential hypertension -     Basic metabolic panel  Daytime somnolence -     Ambulatory referral to Sleep Studies  Insomnia, unspecified type -     Ambulatory referral to Sleep Studies  Constipation, unspecified constipation type -     docusate sodium (COLACE) 100 MG capsule; Take 1 capsule (100 mg total) by mouth 2 (two) times daily as needed for mild constipation.  Fatigue, unspecified type -     Basic metabolic panel -     CBC   I am having Sherry [name] start on docusate sodium. I am also having her maintain her bromocriptine, Vitamin D3, FISH OIL CONCENTRATE, Multiple Vitamins-Minerals (MULTIVITAMIN ADULT PO), MELATONIN PO, and triamterene-hydrochlorothiazide.  Meds ordered this encounter  Medications  . docusate sodium (COLACE) 100 MG capsule    Sig: Take 1 capsule (100 mg total) by mouth 2 (two) times daily as needed for mild constipation.    Dispense:  10 capsule    Refill:  0      Order Specific Question:   Supervising Provider    Answer:   Lucille Passy [[id number]]   Follow-up: Return in about 4 weeks (around 11/23/2017) for HTN and fatigue.  Wilfred Lacy, NP

## 2017-10-26 NOTE — Patient Instructions (Addendum)
Call [facility] to schedule colonoscopy: [phone].  Go to lab for blood draw.  You will be called to schedule appt for sleep study.

## 2017-10-27 ENCOUNTER — Encounter: Payer: Self-pay | Admitting: Nurse Practitioner

## 2017-10-27 ENCOUNTER — Telehealth: Payer: Self-pay | Admitting: Nurse Practitioner

## 2017-10-27 NOTE — Telephone Encounter (Signed)
Pt is aware.  

## 2017-10-28 ENCOUNTER — Telehealth: Payer: Self-pay | Admitting: Nurse Practitioner

## 2017-10-28 DIAGNOSIS — E229 Hyperfunction of pituitary gland, unspecified: Secondary | ICD-10-CM

## 2017-10-28 DIAGNOSIS — R5383 Other fatigue: Secondary | ICD-10-CM

## 2017-10-28 NOTE — Addendum Note (Signed)
Addended by: Diona Foley on: 10/28/2017 11:56 AM   Modules accepted: Orders

## 2017-10-28 NOTE — Telephone Encounter (Signed)
Pt request written lab order send to her home address. Pt will use [facility] for her lab work.   Copied from [place] [phone]. Topic: Inquiry >> Oct 28, 2017 10:22 AM Oliver Pila B wrote: Reason for CRM: pt called to have her lab orders mailed to her; pt states she draws her labs @ [facility] where she lives

## 2017-11-09 ENCOUNTER — Other Ambulatory Visit: Payer: Self-pay | Admitting: Nurse Practitioner

## 2017-11-09 DIAGNOSIS — E229 Hyperfunction of pituitary gland, unspecified: Secondary | ICD-10-CM | POA: Diagnosis not present

## 2017-11-09 DIAGNOSIS — R5383 Other fatigue: Secondary | ICD-10-CM | POA: Diagnosis not present

## 2017-11-10 LAB — CBC
HEMATOCRIT: 37.8 % (ref 34.0–46.6)
Hemoglobin: 12.4 g/dL (ref 11.1–15.9)
MCH: 28.1 pg (ref 26.6–33.0)
MCHC: 32.8 g/dL (ref 31.5–35.7)
MCV: 86 fL (ref 79–97)
Platelets: 433 10*3/uL (ref 150–450)
RBC: 4.41 x10E6/uL (ref 3.77–5.28)
RDW: 15.5 % — AB (ref 12.3–15.4)
WBC: 11.3 10*3/uL — AB (ref 3.4–10.8)

## 2017-11-10 LAB — BASIC METABOLIC PANEL
BUN/Creatinine Ratio: 11 (ref 9–23)
BUN: 9 mg/dL (ref 6–24)
CALCIUM: 9.2 mg/dL (ref 8.7–10.2)
CO2: 23 mmol/L (ref 20–29)
Chloride: 102 mmol/L (ref 96–106)
Creatinine, Ser: 0.81 mg/dL (ref 0.57–1.00)
GFR, EST AFRICAN AMERICAN: 97 mL/min/{1.73_m2} (ref >59)
GFR, EST NON AFRICAN AMERICAN: 84 mL/min/{1.73_m2} (ref >59)
Glucose: 91 mg/dL (ref 65–99)
Potassium: 4 mmol/L (ref 3.5–5.2)
Sodium: 143 mmol/L (ref 134–144)

## 2017-11-15 DIAGNOSIS — D352 Benign neoplasm of pituitary gland: Secondary | ICD-10-CM | POA: Diagnosis not present

## 2017-11-15 DIAGNOSIS — E221 Hyperprolactinemia: Secondary | ICD-10-CM | POA: Diagnosis not present

## 2017-11-15 DIAGNOSIS — D353 Benign neoplasm of craniopharyngeal duct: Secondary | ICD-10-CM | POA: Diagnosis not present

## 2017-11-15 DIAGNOSIS — K080 Exfoliation of teeth due to systemic causes: Secondary | ICD-10-CM | POA: Diagnosis not present

## 2017-11-16 DIAGNOSIS — D352 Benign neoplasm of pituitary gland: Secondary | ICD-10-CM | POA: Diagnosis not present

## 2017-11-16 DIAGNOSIS — E559 Vitamin D deficiency, unspecified: Secondary | ICD-10-CM | POA: Diagnosis not present

## 2017-11-16 DIAGNOSIS — D353 Benign neoplasm of craniopharyngeal duct: Secondary | ICD-10-CM | POA: Diagnosis not present

## 2017-11-16 DIAGNOSIS — E221 Hyperprolactinemia: Secondary | ICD-10-CM | POA: Diagnosis not present

## 2017-11-23 ENCOUNTER — Ambulatory Visit: Payer: Federal, State, Local not specified - PPO | Admitting: Nurse Practitioner

## 2017-12-14 ENCOUNTER — Ambulatory Visit: Payer: Federal, State, Local not specified - PPO | Admitting: Nurse Practitioner

## 2017-12-14 ENCOUNTER — Encounter: Payer: Self-pay | Admitting: Nurse Practitioner

## 2017-12-14 VITALS — BP 150/92 | HR 84 | Temp 97.4°F | Ht 62.75 in | Wt 308.0 lb

## 2017-12-14 DIAGNOSIS — L2082 Flexural eczema: Secondary | ICD-10-CM | POA: Diagnosis not present

## 2017-12-14 DIAGNOSIS — R5383 Other fatigue: Secondary | ICD-10-CM | POA: Diagnosis not present

## 2017-12-14 DIAGNOSIS — I10 Essential (primary) hypertension: Secondary | ICD-10-CM

## 2017-12-14 DIAGNOSIS — Z9889 Other specified postprocedural states: Secondary | ICD-10-CM | POA: Insufficient documentation

## 2017-12-14 DIAGNOSIS — Z86018 Personal history of other benign neoplasm: Secondary | ICD-10-CM

## 2017-12-14 MED ORDER — TRIAMTERENE-HCTZ 37.5-25 MG PO TABS: 0.5000 | ORAL_TABLET | Freq: Every day | ORAL | 1 refills | Status: DC

## 2017-12-14 MED ORDER — TRIAMCINOLONE ACETONIDE 0.5 % EX CREA: 1.0000 "application " | TOPICAL_CREAM | Freq: Two times a day (BID) | CUTANEOUS | 5 refills | Status: DC

## 2017-12-14 NOTE — Progress Notes (Signed)
Subjective:  Patient ID: Sherry [name], female    DOB: 08/01/1965  Age: 52 y.o. MRN: [id number]  CC: Follow-up (4 wk follow up/ little better for fatigue. BP med refill. forgot BP log at home, normally run between 135-145/88-94. FYi--will call and schedule Colonoscopy. ) and Rash (dry skin and itchy. sinde effect of med, request rx?)  Rash  This is a chronic problem. The current episode started more than 1 year ago. The problem has been waxing and waning since onset. Location: palms. The rash is characterized by dryness, itchiness and scaling. She was exposed to nothing. Pertinent negatives include no facial edema or joint pain. Past treatments include anti-itch cream and moisturizer. The treatment provided mild relief. Her past medical history is significant for eczema.   HTN: Waxing and waning: Taking maxzide in AM. Persistent fatigue and daytime somnolence. Upcoming appt with neurology for sleep study.([facility]) BP Readings from Last 3 Encounters:  12/14/17 (!) 150/92  10/26/17 128/82  09/26/17 (!) 160/98   Will schedule colonoscopy in January.  Pituitary gland tumor and hyperparathyroidism: Managed by Dr. Everlean Cherry [facility]), last seen 11/16/2017. Labs indicated elevated cortisol and PTH. Scheduled for repeat Brain MRI  onset 01/2017: report intermittent tinnitus, intermittent left ear fullness, intermittent dizziness, fogginess, difficulty focusing on conversations. Denies any syncope, no nausea. had 3episodes, last one 2weeks ago. States she was ENT evaluation in past, denies any diagnosis of mernieres or vertigo  Reviewed past Medical, Social and Family history today.  Outpatient Medications Prior to Visit  Medication Sig Dispense Refill  . bromocriptine (PARLODEL) 2.5 MG tablet TAKE ONE (1) TABLET BY MOUTH ONCE DAILY    . Cholecalciferol (VITAMIN D3) 5000 units TABS Take by mouth.    . docusate sodium (COLACE) 100 MG capsule Take 1 capsule (100 mg total) by  mouth 2 (two) times daily as needed for mild constipation. 10 capsule 0  . MELATONIN PO Take by mouth. Help with sleep PRN    . Multiple Vitamins-Minerals (MULTIVITAMIN ADULT PO) Take by mouth.    . Omega-3 Fatty Acids (FISH OIL CONCENTRATE) 300 MG CAPS Take 500 mg by mouth.    . triamterene-hydrochlorothiazide (MAXZIDE-25) 37.5-25 MG tablet Take 0.5 tablets by mouth daily. 30 tablet 1   No facility-administered medications prior to visit.     ROS See HPI  Objective:  BP (!) 150/92   Pulse 84   Temp (!) 97.4 F (36.3 C) (Oral)   Ht 5' 2.75" (1.594 m)   Wt (!) 308 lb (139.7 kg)   SpO2 100%   BMI 55.00 kg/m   BP Readings from Last 3 Encounters:  12/14/17 (!) 150/92  10/26/17 128/82  09/26/17 (!) 160/98    Wt Readings from Last 3 Encounters:  12/14/17 (!) 308 lb (139.7 kg)  10/26/17 (!) 302 lb (137 kg)  09/26/17 (!) 308 lb (139.7 kg)    Physical Exam  Constitutional: She is oriented to person, place, and time. She appears well-developed and well-nourished.  HENT:  Right Ear: Tympanic membrane, external ear and ear canal normal. No middle ear effusion.  Left Ear: Tympanic membrane, external ear and ear canal normal.  No middle ear effusion.  Cardiovascular: Normal rate.  Pulmonary/Chest: Effort normal.  Musculoskeletal: She exhibits no edema.  Neurological: She is alert and oriented to person, place, and time.  Skin: Rash noted.  Psychiatric: She has a normal mood and affect. Her behavior is normal. Thought content normal.  Vitals reviewed.   Lab Results  Component Value  Date   WBC 11.3 (H) 11/09/2017   HGB 12.4 11/09/2017   HCT 37.8 11/09/2017   PLT 433 11/09/2017   GLUCOSE 91 11/09/2017   CHOL 182 06/07/2017   TRIG 83.0 06/07/2017   HDL 54.20 06/07/2017   LDLCALC 111 (H) 06/07/2017   ALT 12 06/07/2017   AST 15 06/07/2017   NA 143 11/09/2017   K 4.0 11/09/2017   CL 102 11/09/2017   CREATININE 0.81 11/09/2017   BUN 9 11/09/2017   CO2 23 11/09/2017   TSH  1.06 06/07/2017    US Breast Ltd Uni Right Inc Axilla  Result Date: 10/20/2017 CLINICAL DATA:  Screening recall from baseline for a possible right breast masses. EXAM: DIGITAL DIAGNOSTIC RIGHT MAMMOGRAM WITH CAD AND TOMO ULTRASOUND RIGHT BREAST COMPARISON:  Previous exam(s). ACR Breast Density Category b: There are scattered areas of fibroglandular density. FINDINGS: In the upper slightly outer quadrant of the right breast, anterior depth there are 2 adjacent small oval circumscribed low-density masses. A third similar appearing mass is seen at the approximate 8 to 9 o'clock position in the anterior depth. Mammographic images were processed with CAD. Ultrasound of the right breast at 12 o'clock, 7 cm from the nipple demonstrates 3 adjacent circumscribed hypoechoic masses. One of these masses measures 6 x 3 x 4 mm, another measures 5 x 3 x 5 mm, and the third measures 5 x 2 x 3 mm. In the right breast at 9 o'clock, 4 cm from the nipple there is a reniform hypoechoic mass measuring 9 x 4 x 5 mm compatible with a intramammary lymph node. IMPRESSION: 1. There are 3 probably benign masses in the right breast at 12 o'clock. 2. There is a benign intramammary lymph node in the right breast at 9 o'clock. RECOMMENDATION: Six-month follow-up diagnostic right breast mammogram and ultrasound is recommended. I have discussed the findings and recommendations with the patient. Results were also provided in writing at the conclusion of the visit. If applicable, a reminder letter will be sent to the patient regarding the next appointment. BI-RADS CATEGORY  3: Probably benign. Electronically Signed   By: Ammie Ferrier M.D.   On: 10/20/2017 08:59   Mm Diag Breast Tomo Uni Right  Result Date: 10/20/2017 CLINICAL DATA:  Screening recall from baseline for a possible right breast masses. EXAM: DIGITAL DIAGNOSTIC RIGHT MAMMOGRAM WITH CAD AND TOMO ULTRASOUND RIGHT BREAST COMPARISON:  Previous exam(s). ACR Breast Density Category  b: There are scattered areas of fibroglandular density. FINDINGS: In the upper slightly outer quadrant of the right breast, anterior depth there are 2 adjacent small oval circumscribed low-density masses. A third similar appearing mass is seen at the approximate 8 to 9 o'clock position in the anterior depth. Mammographic images were processed with CAD. Ultrasound of the right breast at 12 o'clock, 7 cm from the nipple demonstrates 3 adjacent circumscribed hypoechoic masses. One of these masses measures 6 x 3 x 4 mm, another measures 5 x 3 x 5 mm, and the third measures 5 x 2 x 3 mm. In the right breast at 9 o'clock, 4 cm from the nipple there is a reniform hypoechoic mass measuring 9 x 4 x 5 mm compatible with a intramammary lymph node. IMPRESSION: 1. There are 3 probably benign masses in the right breast at 12 o'clock. 2. There is a benign intramammary lymph node in the right breast at 9 o'clock. RECOMMENDATION: Six-month follow-up diagnostic right breast mammogram and ultrasound is recommended. I have discussed the findings and  recommendations with the patient. Results were also provided in writing at the conclusion of the visit. If applicable, a reminder letter will be sent to the patient regarding the next appointment. BI-RADS CATEGORY  3: Probably benign. Electronically Signed   By: Ammie Ferrier M.D.   On: 10/20/2017 08:59    Assessment & Plan:   Kijana was seen today for follow-up and rash.  Diagnoses and all orders for this visit:  Essential hypertension -     triamterene-hydrochlorothiazide (MAXZIDE-25) 37.5-25 MG tablet; Take 0.5 tablets by mouth daily.  Flexural eczema -     triamcinolone cream (KENALOG) 0.5 %; Apply 1 application topically 2 (two) times daily.  Fatigue, unspecified type   I am having Sherry [name] start on triamcinolone cream. I am also having her maintain her bromocriptine, Vitamin D3, FISH OIL CONCENTRATE, Multiple Vitamins-Minerals (MULTIVITAMIN ADULT PO),  MELATONIN PO, docusate sodium, and triamterene-hydrochlorothiazide.  Meds ordered this encounter  Medications  . triamcinolone cream (KENALOG) 0.5 %    Sig: Apply 1 application topically 2 (two) times daily.    Dispense:  15 g    Refill:  5    Order Specific Question:   Supervising Provider    Answer:   Lucille Passy [[id number]]  . triamterene-hydrochlorothiazide (MAXZIDE-25) 37.5-25 MG tablet    Sig: Take 0.5 tablets by mouth daily.    Dispense:  90 tablet    Refill:  1    Order Specific Question:   Supervising Provider    Answer:   Lucille Passy [[id number]]    Follow-up: Return in about 3 months (around 03/16/2018) for HTN and fatigue.  Wilfred Lacy, NP

## 2017-12-14 NOTE — Patient Instructions (Addendum)
Please fax a copy of Home BP readings: [phone]. You can also send these readings via mychart.  Please inform endocrinology and neurology about recurrent dizziness and mental fogginess within the last 2weeks.  Maintain appt for sleep study.

## 2017-12-15 ENCOUNTER — Telehealth: Payer: Self-pay | Admitting: Nurse Practitioner

## 2017-12-15 DIAGNOSIS — I10 Essential (primary) hypertension: Secondary | ICD-10-CM

## 2017-12-15 MED ORDER — AMLODIPINE BESYLATE 5 MG PO TABS: 5.0000 mg | ORAL_TABLET | Freq: Every day | ORAL | 3 refills | Status: DC

## 2017-12-15 NOTE — Telephone Encounter (Signed)
Pt returned call to office. Message from Dr Lorayne Marek 12/15/17 read to patient. Appointment scheduled for 3 week check. Pt will bring he BP readings. Pt still has 3 month appointment scheduled. Pt is aware.

## 2017-12-15 NOTE — Telephone Encounter (Signed)
Left vm for the pt to call back. Need to inform of message below.

## 2017-12-15 NOTE — Telephone Encounter (Signed)
Home BP reading indicate BP is still not controlled. The average at 140s-150s/90s I am adding amlodipine 5mg  at bedtime. I will like to see you in 3weeks instead of 59months. Bring BP readings to this visit. Also need to inform neurology and endocrinology of dizzy spells.

## 2017-12-16 NOTE — Telephone Encounter (Signed)
Pt called and stated she would like to ask a question about a new rx. Please call back [phone]

## 2017-12-16 NOTE — Telephone Encounter (Signed)
Pt is aware.  

## 2017-12-16 NOTE — Telephone Encounter (Signed)
Spoke with the pt. Advise Baldo Ash is adding amlodipine at bedtime, pt still follow her current medication as prescribed.

## 2017-12-19 ENCOUNTER — Ambulatory Visit: Payer: Self-pay | Admitting: *Deleted

## 2017-12-19 NOTE — Telephone Encounter (Signed)
Pt stated she hold this med last night and the pain went away today but she just now felt pressure in her left leg. Baldo Ash advise to resume med tonight and make an appt to evaluate on legs/back pain.   Pt stated she has an appt on 01/06/18 already, offer an appt sooner but pt stated she will call back.

## 2017-12-19 NOTE — Telephone Encounter (Signed)
Sherry [name] please advise 

## 2017-12-19 NOTE — Telephone Encounter (Signed)
Pt calling with concern of back pain that she thinks may be related to taking Amlodipine. Pt states that she started the medication on last Friday and is only taking the medication at night. Pt states that on last night around 7:30pm she experienced severe back pain to the left lower side. Pt states that she rated the pain at 8 1/2-9. Pt states that she took a total of 2 Midol last night which helped to relieve the pain. Pt denies having back pain at this time and states that her lower back is a little uncomfortable and sore. Pt states that she did experience some discomfort to the left lower back on Thursday prior to starting the Amlodipine but the pt states that the pain seemed to worsen after taking Amlodipine  Pt offered to come in for an appt but pt declines at this time. Pt would just like to know if Amlodipine could be causing the lower back pain she is experiencing and also wants to know if she should continue to take the medication. Pt can be contacted at [phone] with recommendations.  Reason for Disposition . Back pain  Answer Assessment - Initial Assessment Questions 1. ONSET: "When did the pain begin?"      Last night  2. LOCATION: "Where does it hurt?" (upper, mid or lower back)     Lower back on the left side 3. SEVERITY: "How bad is the pain?"  (e.g., Scale 1-10; mild, moderate, or severe)   - MILD (1-3): doesn't interfere with normal activities    - MODERATE (4-7): interferes with normal activities or awakens from sleep    - SEVERE (8-10): excruciating pain, unable to do any normal activities      Denies any pain at this time 4. PATTERN: "Is the pain constant?" (e.g., yes, no; constant, intermittent)      Not constant 5. RADIATION: "Does the pain shoot into your legs or elsewhere?"     No felt like it was "from the inside out" 6. CAUSE:  "What do you think is causing the back pain?"      Thinks it may be due to  7. BACK OVERUSE:  "Any recent lifting of heavy objects,  strenuous work or exercise?"     no 8. MEDICATIONS: "What have you taken so far for the pain?" (e.g., nothing, acetaminophen, NSAIDS)     Did take 2 Midol last night 9. NEUROLOGIC SYMPTOMS: "Do you have any weakness, numbness, or problems with bowel/bladder control?"     Already experienced weakness or fatigue 10. OTHER SYMPTOMS: "Do you have any other symptoms?" (e.g., fever, abdominal pain, burning with urination, blood in urine)       No new symptoms  11. PREGNANCY: "Is there any chance you are pregnant?" (e.g., yes, no; LMP)       No, LMP-12/07/17, has one every other month  Protocols used: BACK PAIN-A-AH

## 2017-12-19 NOTE — Telephone Encounter (Signed)
Hold medication tonight. Resume tomorrow. Take half tab at bedtime.

## 2017-12-21 ENCOUNTER — Encounter: Payer: Self-pay | Admitting: Neurology

## 2017-12-21 ENCOUNTER — Ambulatory Visit (INDEPENDENT_AMBULATORY_CARE_PROVIDER_SITE_OTHER): Payer: Federal, State, Local not specified - PPO | Admitting: Neurology

## 2017-12-21 VITALS — BP 133/89 | HR 80 | Ht 63.0 in | Wt 310.0 lb

## 2017-12-21 DIAGNOSIS — D353 Benign neoplasm of craniopharyngeal duct: Secondary | ICD-10-CM | POA: Diagnosis not present

## 2017-12-21 DIAGNOSIS — I10 Essential (primary) hypertension: Secondary | ICD-10-CM | POA: Diagnosis not present

## 2017-12-21 DIAGNOSIS — Z6841 Body Mass Index (BMI) 40.0 and over, adult: Secondary | ICD-10-CM

## 2017-12-21 DIAGNOSIS — D352 Benign neoplasm of pituitary gland: Secondary | ICD-10-CM | POA: Diagnosis not present

## 2017-12-21 DIAGNOSIS — F518 Other sleep disorders not due to a substance or known physiological condition: Secondary | ICD-10-CM | POA: Diagnosis not present

## 2017-12-21 DIAGNOSIS — Z86018 Personal history of other benign neoplasm: Secondary | ICD-10-CM

## 2017-12-21 DIAGNOSIS — R5383 Other fatigue: Secondary | ICD-10-CM | POA: Diagnosis not present

## 2017-12-21 DIAGNOSIS — E661 Drug-induced obesity: Secondary | ICD-10-CM

## 2017-12-21 NOTE — Progress Notes (Signed)
S[facility]EP [facility]INE CLINIC   Provider:  Larey Seat, MD    Primary Care Physician:  Sherry Norris, MD   Referring Provider: Flossie Buffy, NP   Chief Complaint  Patient presents with  . New Patient (Initial Visit)    pt with husband, rm 65. pt was having some difficulty with sleeping but was experiencing being tired. BP was elevated  and was started on BP medication. she states now she is sleeping well since starting on medication. despite sleeping through the night now she states her daytime sleepiness is somewhat better but still present and not as refreshed. husband  witnessed apnea events as well as snoring. pt had a sleep study done in her teenager years.     HPI:  Sherry [name] is a 52 y.o. AA right handed  female patient here with her husband on 12-21-2017 , in a referral  from NP. Nche for insomnia.   Sherry [name] reports having experienced a severe vertigo spell in December 2018 for which she took a while to recover.  Her husband had to help her as she could not walk steady on her own.  She experienced a counterclockwise rotation of the surroundings and was heavily nauseated unable to focus her gaze.  From December on she progressively experienced less restful and less restorative sleep, and she  wondered if high blood pressure was the cause.  She had a wellness and full physical exam in spring 2019 when her primary care physician noted the elevated blood pressure and begun treating her with medication.  She was asked to keep a blood pressure journal starting, and by September was finally beginning treatment with medication which has since then been augmented.  She is now taking Maxide a mixture of triamterene and hydrochlorothiazide as well as Norvasc-amlodipine 5 mg daily.  She has a history of a pituitary adenoma that secreted prolactin, and for this reason has been for 20 years now taking bromocriptine 2.5 mg daily.  We did she have the tumor removed in 1996.  Just this morning  she had a follow-up and to look again at her pituitary gland. Her BP has been controlled and she is now more sleepy, her nocturnal sleep has improved, is less fragamented and yet she is still not well rested.    Chief complaint according to patient : " I feel fatigued, more sleepy- insomnia is improved. "  Sleep habits are as follows: lunch and Dinner time is variable, she works as a Therapist, nutritional, has students and eats on the run. Her husband is teaching at the [employer]. Often eats later than 7 pm.  Bedtime is whenever her husband Sherry [name] comes home, at 9.30 PM .  She is asleep promptly . Her bedroom is cool, quiet and dark- the couple is building and resides in a rental home meanwhile.  No TV in the bedroom.  Sleeps on the right side, ending up on her back on 2 pillows, stays asleep 3-4 hours before nocturia,  twice nightly. She wakes up with a dry mouth, drinks water - rises at 7 AM. Feels not refreshed. Dry mouth again, no headaches, sometimes lightheadedness.     Sleep medical history and family sleep history: pituitary adenoma, incompletely resected. HTN, super-obesity.   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 Vegetarian.   Review of Systems: Out of a complete 14 system review, the patient complains of  only the following symptoms, and all other reviewed systems are negative.  GERD, acid reflux,  Epworth Sleepiness score 11/ 24 , Fatigue severity score 45/ 63   , depression score n/a   Social History   Socioeconomic History  . Marital status: Married    Spouse name: Not on file  . Number of children: 0  . Years of education: Not on file  . Highest education level: Not on file  Occupational History  . Not on file  Social Needs  . Financial resource strain: Not on file  . Food insecurity:    Worry: Not on file    Inability: Not on file  . Transportation needs:     Medical: Not on file    Non-medical: Not on file  Tobacco Use  . Smoking status: Never Smoker  . Smokeless tobacco: Never Used  Substance and Sexual Activity  . Alcohol use: Never    Frequency: Never  . Drug use: Never  . Sexual activity: Yes  Lifestyle  . Physical activity:    Days per week: Not on file    Minutes per session: Not on file  . Stress: Not on file  Relationships  . Social connections:    Talks on phone: Not on file    Gets together: Not on file    Attends religious service: Not on file    Active member of club or organization: Not on file    Attends meetings of clubs or organizations: Not on file    Relationship status: Not on file  . Intimate partner violence:    Fear of current or ex partner: Not on file    Emotionally abused: Not on file    Physically abused: Not on file    Forced sexual activity: Not on file  Other Topics Concern  . Not on file  Social History Narrative  . Not on file    Family History  Problem Relation Age of Onset  . Hypertension Mother   . Heart attack Father 21       result of DVT  . Pulmonary embolism Father   . Hypertension Maternal Aunt   . Cancer Maternal Aunt        breast cancer  . CVA Maternal Aunt 70  . Stroke Maternal Aunt 70  . Breast cancer Maternal Aunt   . Hypertension Maternal Uncle     Past Medical History:  Diagnosis Date  . Brain tumor (benign) ([place]) 1996   benign pituitary adenoma  . Chickenpox     Past Surgical History:  Procedure Laterality Date  . CRANIECTOMY / CRANIOTOMY FOR EXCISION OF BRAIN TUMOR  1996   pituitary adenoma  . WISDOM TOOTH EXTRACTION      Current Outpatient Medications  Medication Sig Dispense Refill  . amLODipine (NORVASC) 5 MG tablet Take 1 tablet (5 mg total) by mouth at bedtime. 30 tablet 3  . bromocriptine (PARLODEL) 2.5 MG tablet TAKE ONE (1) TABLET BY MOUTH ONCE DAILY    . Cholecalciferol (VITAMIN D3) 5000 units TABS Take by mouth.    . docusate sodium (COLACE)  100 MG capsule Take 1 capsule (100 mg total) by mouth 2 (two) times daily as needed for mild constipation. 10 capsule 0  . MELATONIN PO Take by mouth. Help with sleep PRN    . Multiple Vitamins-Minerals (MULTIVITAMIN ADULT PO) Take by mouth.    . Omega-3 Fatty Acids (FISH OIL CONCENTRATE) 300 MG CAPS Take 500 mg by mouth.    [name]  triamcinolone cream (KENALOG) 0.5 % Apply 1 application topically 2 (two) times daily. 15 g 5  . triamterene-hydrochlorothiazide (MAXZIDE-25) 37.5-25 MG tablet Take 0.5 tablets by mouth daily. 90 tablet 1   No current facility-administered medications for this visit.     Allergies as of 12/21/2017 - Review Complete 12/21/2017  Allergen Reaction Noted  . Aspirin Rash and Nausea And Vomiting 08/10/2011    Vitals: BP 133/89   Pulse 80   Ht 5\' 3"  (1.6 m)   Wt (!) 310 lb (140.6 kg)   BMI 54.91 kg/m  Last Weight:  Wt Readings from Last 1 Encounters:  12/21/17 (!) 310 lb (140.6 kg)   ZOX:WRUE mass index is 54.91 kg/m.     Last Height:   Ht Readings from Last 1 Encounters:  12/21/17 5\' 3"  (1.6 m)    Physical exam:  General: The patient is awake, alert and appears not in acute distress. The patient is well groomed. Head: Normocephalic, atraumatic. Neck is supple. Mallampati  3, neck: 17"  neck circumference:17" . Nasal airflow patent ,  TMJ is evident . Retrognathia is not seen . Retainer - every night for invisa-line.  Cardiovascular:   Regular rate and rhythm, without murmurs or carotid bruit, and without distended neck veins. Respiratory: Lungs are clear to auscultation.-   Skin: ankle edema, not rash.  Trunk: BMI is 55. The patient's posture is erect.   Neurologic exam : The patient is awake and alert, oriented to place and time.   Memory subjective described as intact.   Attention span & concentration ability appears impaired, logorrhoeic and off target, tangential, distracted. Speech is fluent,  without  dysarthria, dysphonia or aphasia.   Cranial  nerves: Pupils are equal and briskly reactive to light. Extraocular movements  in vertical and horizontal planes intact and without nystagmus.  She reports impaired depth perception. Visual fields by finger perimetry were intact- there may be a loss of peripheral vision on the right side. ???. She has a compensatory head position , leaning the head to the left and turning slightly right.  Hearing to finger rub intact.   Facial sensation intact to fine touch.  Facial motor strength is symmetric and tongue and uvula move midline. Shoulder shrug was symmetrical.   Motor exam:  Normal tone, muscle bulk and symmetric strength in all extremities.  Sensory:  Fine touch, pinprick and vibration were tested in all extremities. Proprioception tested in the upper extremities was normal. Coordination: Rapid alternating movements in the fingers/hands was normal. Finger-to-nose maneuver  normal without evidence of ataxia, dysmetria or tremor. Gait and station: Patient walks without assistive device and is able unassisted to climb up to the exam table. Strength within normal limits. Stance is wider based , but stable .  Deep tendon reflexes: in the upper and lower extremities are symmetric and intact. Babinski maneuver response is equivocal.   Assessment:  After physical and neurologic examination, review of laboratory studies,  Personal review of imaging studies, reports of other /same  Imaging studies, results of polysomnography and / or neurophysiology testing and pre-existing records as far as provided in visit., my assessment is   1)  2)  3)   The patient was advised of the nature of the diagnosed disorder , the treatment options and the  risks for general health and wellness arising from not treating the condition.   I spent more than 45 minutes of face to face time with the patient.  Greater than 50% of time was  spent in counseling and coordination of care. We have discussed the diagnosis and  differential and I answered the patient's questions.    Plan:  Treatment plan and additional workup :  High risk for OSA- super-obese,and fatigued, snorer, large neck and small upper airway.   HTN may be maintained by OSA. Same for weight gain. Insomnia. EDS, fatigue. SOB.   Attended Sleep study with CO2 monitoring, vivid dreams at sleep onset.   Check for REM sleep related insomnia- arousals.  Does she enter N3 sleep ? -  extrasellar tumor/ adenoma= this can explain human growth hormone secretion aside from prolactin in her pituitary adenoma.    Sherry Seat, MD 30/94/0768, 08:81 PM  Certified in Neurology by ABPN Certified in [place] by [facility] [address], [place], [place] [address]

## 2017-12-21 NOTE — Patient Instructions (Signed)
Pituitary Tumors Pituitary tumors are abnormal growths found in the pituitary gland. The pituitary gland is a small organ-about the size of a dime-located in the center of the brain. It makes hormones that affect growth and the functions of other glands in the body. Most pituitary tumors are benign. This means they are noncancerous. They grow slowly and do not spread to other parts of the body. A pituitary tumor may make the pituitary gland produce too many hormones. Tumors that make hormones are called functioning tumors (those that do not make hormones are called nonfunctioning tumors). Problems that can be caused by pituitary tumors include:  Cushing disease. This disease causes fat to build up in the face, back, and chest while the arms and legs become thin.  Acromegaly. This is a condition in which the hands, feet, and face are larger than normal.  Breast milk production even though there is no pregnancy.  What are the causes? The cause of most pituitary tumors is not known. In some cases, these kinds of tumors run in a family. What increases the risk? Some cases of pituitary tumors are due to genetic factors that a person inherits that increase the likelihood of developing certain tumors, including pituitary tumors. What are the signs or symptoms?  Headaches.  Vision problems.  Weakness or low energy.  Clear fluid draining from the nose.  Changes in the sense of smell.  Feeling sick to your stomach (nauseous) and vomiting.  Problems caused by the production of too many hormones, such as: ? Infertility. ? Loss of menstrual periods in women. ? Abnormal growth. ? High blood pressure (hypertension). ? Heat or cold intolerance. ? Other skin and body changes. ? Nipple discharge. ? Decreased sexual function. How is this diagnosed? If you develop symptoms, you will be sent for a CT scan or MRI to look for pituitary tumors. If you know that these kinds of tumors run in your family,  you may need to have your blood tested regularly to monitor pituitary hormone levels. How is this treated? These tumors are best treated when they are found and diagnosed early. Treatments include:  Surgical removal of the tumor. This is the most common treatment.  Radiation therapy. During this treatment, high doses of X-rays are used to kill tumor cells.  Drug therapy. This involves using certain medicines to block the pituitary gland from producing too many hormones.  Follow these instructions at home:  Drink plenty of fluids.  Measure your urine output if directed to do so by your health care provider.  Do not pick your nose or remove any crusting.  Do not do any activities that require straining.  Take all medicines as directed by your health care provider.  Keep follow-up appointments as directed by your health care provider. Contact a health care provider if:  You have sudden, unusual thirst.  You are urinating frequently.  You have a headache that will not go away.  You have new vision changes.  You notice clear fluid leaking from your nose or ears, a sensation of fluid trickling down the back of your throat, or a salty taste in your mouth.  You are having trouble concentrating. Get help right away if:  Your symptoms suddenly become severe.  You have a nosebleed that does not stop after a few minutes.  You have a fever over 101F (38.3C).  You have a severe headache or a stiff neck.  You are confused or not as alert as usual.  You  have chest pain or shortness of breath. This information is not intended to replace advice given to you by your health care provider. Make sure you discuss any questions you have with your health care provider. Document Released: 01/08/2002 Document Revised: 06/26/2015 Document Reviewed: 07/21/2012 Elsevier Interactive Patient Education  [employer].

## 2018-01-06 ENCOUNTER — Ambulatory Visit: Payer: Federal, State, Local not specified - PPO | Admitting: Nurse Practitioner

## 2018-01-06 ENCOUNTER — Encounter: Payer: Self-pay | Admitting: Nurse Practitioner

## 2018-01-06 VITALS — BP 146/78 | HR 107 | Temp 97.6°F | Ht 63.0 in | Wt 310.8 lb

## 2018-01-06 DIAGNOSIS — I152 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: Secondary | ICD-10-CM | POA: Insufficient documentation

## 2018-01-06 DIAGNOSIS — R8290 Unspecified abnormal findings in urine: Secondary | ICD-10-CM | POA: Diagnosis not present

## 2018-01-06 DIAGNOSIS — R109 Unspecified abdominal pain: Secondary | ICD-10-CM | POA: Diagnosis not present

## 2018-01-06 DIAGNOSIS — I10 Essential (primary) hypertension: Secondary | ICD-10-CM

## 2018-01-06 MED ORDER — LISINOPRIL 5 MG PO TABS: 5.0000 mg | ORAL_TABLET | Freq: Every day | ORAL | 1 refills | Status: DC

## 2018-01-06 NOTE — Progress Notes (Signed)
Subjective:  Patient ID: Sherry [name], female    DOB: 16-Jun-1965  Age: 52 y.o. MRN: [id number]  CC: Hypertension (She has been taking her BP-not daily. She has been tolerating Maxide daily-has been having acid reflux often-has tried OTC meds with no improvement, has been having headaches.  ) and Back Pain (Lower back pain-located on her right aspect-has not noticed the pain untill starting Maxide. )   Back Pain  This is a new problem. The current episode started more than 1 month ago. The problem occurs intermittently. The problem has been waxing and waning since onset. The pain is present in the lumbar spine and thoracic spine. The quality of the pain is described as aching. The pain does not radiate. The symptoms are aggravated by sitting. Pertinent negatives include no abdominal pain, bladder incontinence, bowel incontinence, chest pain, dysuria, fever, numbness, paresis, paresthesias, tingling or weakness. Risk factors include poor posture, sedentary lifestyle and obesity. She has tried NSAIDs for the symptoms. The treatment provided significant relief.  noticed back pain with use of amlodipine.  HTN: Uncontrolled with amlodipine and maxzide Sleep study scheduled for 01/19/2018. Home BP reading: 142/88, 155/97, 151/90, 154/91  Reviewed past Medical, Social and Family history today.  Outpatient Medications Prior to Visit  Medication Sig Dispense Refill  . bromocriptine (PARLODEL) 2.5 MG tablet TAKE ONE (1) TABLET BY MOUTH ONCE DAILY    . Cholecalciferol (VITAMIN D3) 5000 units TABS Take by mouth.    . docusate sodium (COLACE) 100 MG capsule Take 1 capsule (100 mg total) by mouth 2 (two) times daily as needed for mild constipation. 10 capsule 0  . Multiple Vitamins-Minerals (MULTIVITAMIN ADULT PO) Take by mouth.    . Omega-3 Fatty Acids (FISH OIL CONCENTRATE) 300 MG CAPS Take 500 mg by mouth.    . triamcinolone cream (KENALOG) 0.5 % Apply 1 application topically 2 (two) times daily. 15  g 5  . triamterene-hydrochlorothiazide (MAXZIDE-25) 37.5-25 MG tablet Take 0.5 tablets by mouth daily. 90 tablet 1  . amLODipine (NORVASC) 5 MG tablet Take 1 tablet (5 mg total) by mouth at bedtime. 30 tablet 3  . MELATONIN PO Take by mouth. Help with sleep PRN     No facility-administered medications prior to visit.     ROS See HPI  Objective:  BP (!) 146/78   Pulse (!) 107   Temp 97.6 F (36.4 C) (Oral)   Ht 5\' 3"  (1.6 m)   Wt (!) 310 lb 12.8 oz (141 kg)   SpO2 98%   BMI 55.06 kg/m   BP Readings from Last 3 Encounters:  01/06/18 (!) 146/78  12/21/17 133/89  12/14/17 (!) 150/92    Wt Readings from Last 3 Encounters:  01/06/18 (!) 310 lb 12.8 oz (141 kg)  12/21/17 (!) 310 lb (140.6 kg)  12/14/17 (!) 308 lb (139.7 kg)    Physical Exam  Constitutional: She is oriented to person, place, and time.  Cardiovascular: Normal rate.  Abdominal: Soft. Bowel sounds are normal.  Negative CVA tenderness  Musculoskeletal: Normal range of motion. She exhibits no tenderness.  Neurological: She is alert and oriented to person, place, and time.  Skin: No rash noted. No erythema.    Lab Results  Component Value Date   WBC 11.3 (H) 11/09/2017   HGB 12.4 11/09/2017   HCT 37.8 11/09/2017   PLT 433 11/09/2017   GLUCOSE 91 11/09/2017   CHOL 182 06/07/2017   TRIG 83.0 06/07/2017   HDL 54.20 06/07/2017   LDLCALC  111 (H) 06/07/2017   ALT 12 06/07/2017   AST 15 06/07/2017   NA 143 11/09/2017   K 4.0 11/09/2017   CL 102 11/09/2017   CREATININE 0.81 11/09/2017   BUN 9 11/09/2017   CO2 23 11/09/2017   TSH 1.06 06/07/2017    US Breast Ltd Uni Right Inc Axilla  Result Date: 10/20/2017 CLINICAL DATA:  Screening recall from baseline for a possible right breast masses. EXAM: DIGITAL DIAGNOSTIC RIGHT MAMMOGRAM WITH CAD AND TOMO ULTRASOUND RIGHT BREAST COMPARISON:  Previous exam(s). ACR Breast Density Category b: There are scattered areas of fibroglandular density. FINDINGS: In the upper  slightly outer quadrant of the right breast, anterior depth there are 2 adjacent small oval circumscribed low-density masses. A third similar appearing mass is seen at the approximate 8 to 9 o'clock position in the anterior depth. Mammographic images were processed with CAD. Ultrasound of the right breast at 12 o'clock, 7 cm from the nipple demonstrates 3 adjacent circumscribed hypoechoic masses. One of these masses measures 6 x 3 x 4 mm, another measures 5 x 3 x 5 mm, and the third measures 5 x 2 x 3 mm. In the right breast at 9 o'clock, 4 cm from the nipple there is a reniform hypoechoic mass measuring 9 x 4 x 5 mm compatible with a intramammary lymph node. IMPRESSION: 1. There are 3 probably benign masses in the right breast at 12 o'clock. 2. There is a benign intramammary lymph node in the right breast at 9 o'clock. RECOMMENDATION: Six-month follow-up diagnostic right breast mammogram and ultrasound is recommended. I have discussed the findings and recommendations with the patient. Results were also provided in writing at the conclusion of the visit. If applicable, a reminder letter will be sent to the patient regarding the next appointment. BI-RADS CATEGORY  3: Probably benign. Electronically Signed   By: Ammie Ferrier M.D.   On: 10/20/2017 08:59   Mm Diag Breast Tomo Uni Right  Result Date: 10/20/2017 CLINICAL DATA:  Screening recall from baseline for a possible right breast masses. EXAM: DIGITAL DIAGNOSTIC RIGHT MAMMOGRAM WITH CAD AND TOMO ULTRASOUND RIGHT BREAST COMPARISON:  Previous exam(s). ACR Breast Density Category b: There are scattered areas of fibroglandular density. FINDINGS: In the upper slightly outer quadrant of the right breast, anterior depth there are 2 adjacent small oval circumscribed low-density masses. A third similar appearing mass is seen at the approximate 8 to 9 o'clock position in the anterior depth. Mammographic images were processed with CAD. Ultrasound of the right breast  at 12 o'clock, 7 cm from the nipple demonstrates 3 adjacent circumscribed hypoechoic masses. One of these masses measures 6 x 3 x 4 mm, another measures 5 x 3 x 5 mm, and the third measures 5 x 2 x 3 mm. In the right breast at 9 o'clock, 4 cm from the nipple there is a reniform hypoechoic mass measuring 9 x 4 x 5 mm compatible with a intramammary lymph node. IMPRESSION: 1. There are 3 probably benign masses in the right breast at 12 o'clock. 2. There is a benign intramammary lymph node in the right breast at 9 o'clock. RECOMMENDATION: Six-month follow-up diagnostic right breast mammogram and ultrasound is recommended. I have discussed the findings and recommendations with the patient. Results were also provided in writing at the conclusion of the visit. If applicable, a reminder letter will be sent to the patient regarding the next appointment. BI-RADS CATEGORY  3: Probably benign. Electronically Signed   By: Ammie Ferrier  M.D.   On: 10/20/2017 08:59    Assessment & Plan:  Normal urinalysis with negative urine culture. Flank pain due to musculoskeletal pain, not related to kidneys.  Stop amlodipine due to muscle pain. Start lisinopril as discussed.  Sherry [name] was seen today for hypertension and back pain.  Diagnoses and all orders for this visit:  Essential hypertension -     lisinopril (PRINIVIL,ZESTRIL) 5 MG tablet; Take 1 tablet (5 mg total) by mouth daily.  Right flank pain -     Urinalysis w microscopic + reflex cultur  Other orders -     REFLEXIVE URINE CULTURE -     Urine Culture   I have discontinued Sherry [name]'s amLODipine. I am also having her start on lisinopril. Additionally, I am having her maintain her bromocriptine, Vitamin D3, FISH OIL CONCENTRATE, Multiple Vitamins-Minerals (MULTIVITAMIN ADULT PO), MELATONIN PO, docusate sodium, triamcinolone cream, and triamterene-hydrochlorothiazide.  Meds ordered this encounter  Medications  . lisinopril (PRINIVIL,ZESTRIL) 5  MG tablet    Sig: Take 1 tablet (5 mg total) by mouth daily.    Dispense:  30 tablet    Refill:  1    Order Specific Question:   Supervising Provider    Answer:   MATTHEWS, CODY [[id number]]    Problem List Items Addressed This Visit      Cardiovascular and Mediastinum   Essential hypertension - Primary   Relevant Medications   lisinopril (PRINIVIL,ZESTRIL) 5 MG tablet    Other Visit Diagnoses    Right flank pain       Relevant Orders   Urinalysis w microscopic + reflex cultur (Completed)       Follow-up: Return in about 4 weeks (around 02/03/2018) for HTN (87mins).  Wilfred Lacy, NP

## 2018-01-08 LAB — URINALYSIS W MICROSCOPIC + REFLEX CULTURE
Bacteria, UA: NONE SEEN /HPF
Bilirubin Urine: NEGATIVE
Glucose, UA: NEGATIVE
HGB URINE DIPSTICK: NEGATIVE
HYALINE CAST: NONE SEEN /LPF
KETONES UR: NEGATIVE
Leukocyte Esterase: NEGATIVE
Nitrites, Initial: NEGATIVE
PH: 7 (ref 5.0–8.0)
Protein, ur: NEGATIVE
SPECIFIC GRAVITY, URINE: 1.024 (ref 1.001–1.03)

## 2018-01-08 LAB — URINE CULTURE
MICRO NUMBER: 91468880
SPECIMEN QUALITY: ADEQUATE

## 2018-01-08 LAB — CULTURE INDICATED

## 2018-01-09 ENCOUNTER — Encounter: Payer: Self-pay | Admitting: Nurse Practitioner

## 2018-01-09 NOTE — Patient Instructions (Addendum)
Normal urinalysis with negative urine culture. Flank pain due to musculoskeletal pain, not related to kidneys.  Stop amlodipine due to muscle pain. Start lisinopril as discussed.

## 2018-01-09 NOTE — Assessment & Plan Note (Signed)
Unable to tolerate amlodipine, caused muscle pain. Replaced with lisinopril. Continue maxzide. Pending sleep study

## 2018-01-19 ENCOUNTER — Ambulatory Visit (INDEPENDENT_AMBULATORY_CARE_PROVIDER_SITE_OTHER): Payer: Federal, State, Local not specified - PPO | Admitting: Neurology

## 2018-01-19 DIAGNOSIS — Z86018 Personal history of other benign neoplasm: Secondary | ICD-10-CM

## 2018-01-19 DIAGNOSIS — Z6841 Body Mass Index (BMI) 40.0 and over, adult: Secondary | ICD-10-CM

## 2018-01-19 DIAGNOSIS — G4733 Obstructive sleep apnea (adult) (pediatric): Secondary | ICD-10-CM | POA: Diagnosis not present

## 2018-01-19 DIAGNOSIS — I10 Essential (primary) hypertension: Secondary | ICD-10-CM

## 2018-01-19 DIAGNOSIS — E661 Drug-induced obesity: Secondary | ICD-10-CM

## 2018-01-19 DIAGNOSIS — D352 Benign neoplasm of pituitary gland: Secondary | ICD-10-CM

## 2018-01-19 DIAGNOSIS — R5383 Other fatigue: Secondary | ICD-10-CM

## 2018-01-19 DIAGNOSIS — F518 Other sleep disorders not due to a substance or known physiological condition: Secondary | ICD-10-CM

## 2018-01-20 DIAGNOSIS — G4733 Obstructive sleep apnea (adult) (pediatric): Secondary | ICD-10-CM | POA: Insufficient documentation

## 2018-01-20 NOTE — Addendum Note (Signed)
Addended by: Larey Seat on: 01/20/2018 10:59 AM   Modules accepted: Orders

## 2018-01-20 NOTE — Procedures (Signed)
PATIENT'S NAME:  Sherry, [name] DOB:      October 03, 1965      MR#:    [id number]     DATE OF RECORDING: 01/19/2018  MR REFERRING M.D.:  Steva Colder, NP and Arnette Norris Study Performed:   Baseline Polysomnogram HISTORY: Chief complaint according to patient: " I feel fatigued, more sleepy-but my insomnia has improved." 12-21-2017 ; Sherry [name] is a 52 y.o. 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 From December 2018 on she progressively experienced less restful and less restorative sleep, and she wondered if high blood pressure was the cause.  She reports having experienced a severe vertigo spell in December 2018 from which she took a while to recover.  Her husband had to help her ambulate as she could not walk steady on her own.  She experienced a counterclockwise rotation of the surroundings and was heavily nauseated unable to focus her gaze.   She had a wellness and full physical exam in Spring 2019 when her primary care physician noted the elevated blood pressure and begun treating her with medication.  She was asked to keep a blood pressure journal starting, and by September was finally beginning treatment with medication which has since then been augmented.   She has a history of a pituitary adenoma /prolactinoma, and for this reason has been for 20 years now taking Bromocriptine 2.5 mg daily.  We did she have the tumor removed in 1996. Her BP has been controlled and she is now more sleepy, her nocturnal sleep has improved, is less fragmented and yet she is still not well rested.  The patient endorsed the Epworth Sleepiness Scale at 11/ 24 points, the FSS at 45/63 points.   The patient's weight 310 pounds with a height of 63 (inches), resulting in a BMI of 55.1 kg/m2. The patient's neck circumference measured 17 inches.  CURRENT MEDICATIONS: Norvasc, Pralodel, Vitamin D3, Colace, Melatonin, Multivitamin, fish oil, Kenalog, Maxzide   PROCEDURE:  This is a  multichannel digital polysomnogram utilizing the Somnostar 11.2 system.  Electrodes and sensors were applied and monitored per AASM Specifications.   EEG, EOG, Chin and Limb EMG, were sampled at 200 Hz.  ECG, Snore and Nasal Pressure, Thermal Airflow, Respiratory Effort, CPAP Flow and Pressure, Oximetry was sampled at 50 Hz. Digital video and audio were recorded.      BASELINE STUDY: Lights Out was at 21:06 and Lights On at 05:00.  Total recording time (TRT) was 474.5 minutes, with a total sleep time (TST) of 353 minutes.   The patient's sleep latency was 39.5 minutes.  REM latency was 157.5 minutes.  The sleep efficiency was 74.4 %.     SLEEP ARCHITECTURE: WASO (Wake after sleep onset) was 112.5 minutes.  There were 2 minutes in Stage N1, 261 minutes Stage N2, 49 minutes Stage N3 and 41 minutes in Stage REM.  The percentage of Stage N1 was 0.6%, Stage N2 was 73.9%, Stage N3 was 13.9%, and Stage R (REM sleep) was 11.6%.    RESPIRATORY ANALYSIS:  There were a total of 91 respiratory events:  69 obstructive apneas, 0 central apneas and 1 mixed apnea with a total of 70 apneas and 21 hypopneas. The patient also had several additional respiratory event related arousals (RERAs).      The total APNEA/HYPOPNEA INDEX (AHI) was 15.5 /hour.  30 events occurred in REM sleep and 41 events in NREM. The REM AHI was 43.9 /hour, versus a non-REM AHI of  11.7/h. The patient spent 10 minutes of total sleep time in the supine position and 343 minutes in non-supine. The supine AHI was 24.0/h versus a non-supine AHI of 15.2/h.  OXYGEN SATURATION & C02:  The Wake baseline 02 saturation was 97%, with the lowest being 74%. Time spent below 89% saturation equaled 35 minutes. Average End Tidal CO2 during sleep was not documented.   PERIODIC LIMB MOVEMENTS:  The arousals were noted as: 83 were spontaneous, 0 were associated with PLMs, and 39 were associated with respiratory events .The patient had a total of 0 Periodic Limb  Movements.    Audio and video analysis did not show any abnormal or unusual movements, behaviors, phonations or vocalizations.  Loud Snoring was noted. EKG was in keeping with normal sinus rhythm (NSR).  Post-study, the patient indicated that sleep was the same as usual.    IMPRESSION: 1. Mild Obstructive Sleep Apnea (OSA) at AHI 15.5/h with significant accentuation during REM sleep to AHI 43.9/h.  2. Associated hypoxemia of 35 minutes total sleep time was less than 10 % of total sleep time and clinically not significant.  3. Primary Snoring and more apnea in supine sleep, which the patient already avoided.   RECOMMENDATIONS:  1. Advise full-night, attended, CPAP titration study to optimize therapy.     I certify that I have reviewed the entire raw data recording prior to the issuance of this report in accordance with the Standards of Accreditation of the American Academy of Sleep Medicine (AASM)      Larey Seat, MD     01-20-2018  Diplomat, American Board of Psychiatry and Neurology  Diplomat, American Board of Sleep Medicine Medical Director, [facility] [employer]

## 2018-01-26 ENCOUNTER — Telehealth: Payer: Self-pay | Admitting: Neurology

## 2018-01-26 NOTE — Telephone Encounter (Signed)
I called pt. I advised pt that Dr. Brett Fairy reviewed their sleep study results and found that patient had mild sleep apnea and recommends that pt be treated with a cpap. Dr. Brett Fairy recommends that pt return for a repeat sleep study in order to properly titrate the cpap and ensure a good mask fit. Pt is agreeable to returning for a titration study. I advised pt that our sleep lab will file with pt's insurance and call pt to schedule the sleep study when we hear back from the pt's insurance regarding coverage of this sleep study. Pt verbalized understanding of results. Pt had no questions at this time but was encouraged to call back if questions arise.

## 2018-01-26 NOTE — Telephone Encounter (Signed)
-----   Message from Larey Seat, MD sent at 01/20/2018 10:59 AM EST ----- IMPRESSION: 1. Mild Obstructive Sleep Apnea (OSA) at AHI 15.5/h with  significant accentuation during REM sleep to AHI 43.9/h.  2. Associated hypoxemia of 35 minutes total sleep time was less  than 10 % of total sleep time and clinically not significant. The patient did not have obesity hypoventilation.  3. Primary Snoring and more apnea in supine sleep, which the  patient already avoided.   RECOMMENDATIONS:  1. Advise full-night, attended, CPAP titration study to optimize  therapy.

## 2018-02-03 ENCOUNTER — Ambulatory Visit (INDEPENDENT_AMBULATORY_CARE_PROVIDER_SITE_OTHER): Payer: Federal, State, Local not specified - PPO | Admitting: Neurology

## 2018-02-03 ENCOUNTER — Encounter: Payer: Self-pay | Admitting: Nurse Practitioner

## 2018-02-03 ENCOUNTER — Ambulatory Visit: Payer: Federal, State, Local not specified - PPO | Admitting: Nurse Practitioner

## 2018-02-03 VITALS — BP 140/82 | HR 106 | Temp 97.6°F | Ht 63.0 in | Wt 319.2 lb

## 2018-02-03 DIAGNOSIS — I10 Essential (primary) hypertension: Secondary | ICD-10-CM | POA: Diagnosis not present

## 2018-02-03 DIAGNOSIS — D352 Benign neoplasm of pituitary gland: Secondary | ICD-10-CM

## 2018-02-03 DIAGNOSIS — G4733 Obstructive sleep apnea (adult) (pediatric): Secondary | ICD-10-CM | POA: Diagnosis not present

## 2018-02-03 DIAGNOSIS — E661 Drug-induced obesity: Secondary | ICD-10-CM

## 2018-02-03 DIAGNOSIS — Z789 Other specified health status: Secondary | ICD-10-CM

## 2018-02-03 DIAGNOSIS — Z6841 Body Mass Index (BMI) 40.0 and over, adult: Secondary | ICD-10-CM

## 2018-02-03 NOTE — Patient Instructions (Addendum)
Maintain current medications Take 1tab of maxzide x 3days, then go back to half tab daily.  Reschedule next OV to 67months from today.

## 2018-02-03 NOTE — Assessment & Plan Note (Signed)
Improve BP reading. Continue lisinopril and maxzide.  BP Readings from Last 3 Encounters:  02/03/18 140/82  01/06/18 (!) 146/78  12/21/17 133/89

## 2018-02-03 NOTE — Progress Notes (Signed)
Subjective:  Patient ID: Sherry [name], female    DOB: 03-29-1965  Age: 53 y.o. MRN: [id number]  CC: Follow-up (BP going down, brought readings, BP meds working better)  HPI HTN: Improved with lisinopril and maxzide. Resolved muscle aches with discontinuation of amlodipine. Home BP readings: 130s/80s BP Readings from Last 3 Encounters:  02/03/18 140/82  01/06/18 (!) 146/78  12/21/17 133/89   Reviewed past Medical, Social and Family history today.  Outpatient Medications Prior to Visit  Medication Sig Dispense Refill  . bromocriptine (PARLODEL) 2.5 MG tablet TAKE ONE (1) TABLET BY MOUTH ONCE DAILY    . Cholecalciferol (VITAMIN D3) 5000 units TABS Take by mouth.    . docusate sodium (COLACE) 100 MG capsule Take 1 capsule (100 mg total) by mouth 2 (two) times daily as needed for mild constipation. 10 capsule 0  . lisinopril (PRINIVIL,ZESTRIL) 5 MG tablet Take 1 tablet (5 mg total) by mouth daily. 30 tablet 1  . MELATONIN PO Take by mouth. Help with sleep PRN    . Multiple Vitamins-Minerals (MULTIVITAMIN ADULT PO) Take by mouth.    . Omega-3 Fatty Acids (FISH OIL CONCENTRATE) 300 MG CAPS Take 500 mg by mouth.    . triamcinolone cream (KENALOG) 0.5 % Apply 1 application topically 2 (two) times daily. 15 g 5  . triamterene-hydrochlorothiazide (MAXZIDE-25) 37.5-25 MG tablet Take 0.5 tablets by mouth daily. 90 tablet 1   No facility-administered medications prior to visit.     ROS See HPI  Objective:  BP 140/82   Pulse (!) 106   Temp 97.6 F (36.4 C) (Oral)   Ht 5\' 3"  (1.6 m)   Wt (!) 319 lb 3.2 oz (144.8 kg)   SpO2 99%   BMI 56.54 kg/m   BP Readings from Last 3 Encounters:  02/03/18 140/82  01/06/18 (!) 146/78  12/21/17 133/89    Wt Readings from Last 3 Encounters:  02/03/18 (!) 319 lb 3.2 oz (144.8 kg)  01/06/18 (!) 310 lb 12.8 oz (141 kg)  12/21/17 (!) 310 lb (140.6 kg)    Physical Exam Constitutional:      General: She is not in acute  distress. Cardiovascular:     Rate and Rhythm: Normal rate and regular rhythm.     Pulses: Normal pulses.     Heart sounds: Normal heart sounds.  Pulmonary:     Effort: Pulmonary effort is normal.     Breath sounds: Normal breath sounds.  Musculoskeletal:     Right lower leg: Edema present.     Left lower leg: Edema present.  Neurological:     Mental Status: She is oriented to person, place, and time.     Lab Results  Component Value Date   WBC 11.3 (H) 11/09/2017   HGB 12.4 11/09/2017   HCT 37.8 11/09/2017   PLT 433 11/09/2017   GLUCOSE 91 11/09/2017   CHOL 182 06/07/2017   TRIG 83.0 06/07/2017   HDL 54.20 06/07/2017   LDLCALC 111 (H) 06/07/2017   ALT 12 06/07/2017   AST 15 06/07/2017   NA 143 11/09/2017   K 4.0 11/09/2017   CL 102 11/09/2017   CREATININE 0.81 11/09/2017   BUN 9 11/09/2017   CO2 23 11/09/2017   TSH 1.06 06/07/2017    US Breast Ltd Uni Right Inc Axilla  Result Date: 10/20/2017 CLINICAL DATA:  Screening recall from baseline for a possible right breast masses. EXAM: DIGITAL DIAGNOSTIC RIGHT MAMMOGRAM WITH CAD AND TOMO ULTRASOUND RIGHT BREAST COMPARISON:  Previous  exam(s). ACR Breast Density Category b: There are scattered areas of fibroglandular density. FINDINGS: In the upper slightly outer quadrant of the right breast, anterior depth there are 2 adjacent small oval circumscribed low-density masses. A third similar appearing mass is seen at the approximate 8 to 9 o'clock position in the anterior depth. Mammographic images were processed with CAD. Ultrasound of the right breast at 12 o'clock, 7 cm from the nipple demonstrates 3 adjacent circumscribed hypoechoic masses. One of these masses measures 6 x 3 x 4 mm, another measures 5 x 3 x 5 mm, and the third measures 5 x 2 x 3 mm. In the right breast at 9 o'clock, 4 cm from the nipple there is a reniform hypoechoic mass measuring 9 x 4 x 5 mm compatible with a intramammary lymph node. IMPRESSION: 1. There are 3  probably benign masses in the right breast at 12 o'clock. 2. There is a benign intramammary lymph node in the right breast at 9 o'clock. RECOMMENDATION: Six-month follow-up diagnostic right breast mammogram and ultrasound is recommended. I have discussed the findings and recommendations with the patient. Results were also provided in writing at the conclusion of the visit. If applicable, a reminder letter will be sent to the patient regarding the next appointment. BI-RADS CATEGORY  3: Probably benign. Electronically Signed   By: Ammie Ferrier M.D.   On: 10/20/2017 08:59   Mm Diag Breast Tomo Uni Right  Result Date: 10/20/2017 CLINICAL DATA:  Screening recall from baseline for a possible right breast masses. EXAM: DIGITAL DIAGNOSTIC RIGHT MAMMOGRAM WITH CAD AND TOMO ULTRASOUND RIGHT BREAST COMPARISON:  Previous exam(s). ACR Breast Density Category b: There are scattered areas of fibroglandular density. FINDINGS: In the upper slightly outer quadrant of the right breast, anterior depth there are 2 adjacent small oval circumscribed low-density masses. A third similar appearing mass is seen at the approximate 8 to 9 o'clock position in the anterior depth. Mammographic images were processed with CAD. Ultrasound of the right breast at 12 o'clock, 7 cm from the nipple demonstrates 3 adjacent circumscribed hypoechoic masses. One of these masses measures 6 x 3 x 4 mm, another measures 5 x 3 x 5 mm, and the third measures 5 x 2 x 3 mm. In the right breast at 9 o'clock, 4 cm from the nipple there is a reniform hypoechoic mass measuring 9 x 4 x 5 mm compatible with a intramammary lymph node. IMPRESSION: 1. There are 3 probably benign masses in the right breast at 12 o'clock. 2. There is a benign intramammary lymph node in the right breast at 9 o'clock. RECOMMENDATION: Six-month follow-up diagnostic right breast mammogram and ultrasound is recommended. I have discussed the findings and recommendations with the patient.  Results were also provided in writing at the conclusion of the visit. If applicable, a reminder letter will be sent to the patient regarding the next appointment. BI-RADS CATEGORY  3: Probably benign. Electronically Signed   By: Ammie Ferrier M.D.   On: 10/20/2017 08:59    Assessment & Plan:   Amori was seen today for follow-up.  Diagnoses and all orders for this visit:  Essential hypertension  OSA (obstructive sleep apnea)   I am having Sherry [name] maintain her bromocriptine, Vitamin D3, FISH OIL CONCENTRATE, Multiple Vitamins-Minerals (MULTIVITAMIN ADULT PO), MELATONIN PO, docusate sodium, triamcinolone cream, triamterene-hydrochlorothiazide, and lisinopril.  No orders of the defined types were placed in this encounter.   Problem List Items Addressed This Visit  Cardiovascular and Mediastinum   Essential hypertension - Primary    Improve BP reading. Continue lisinopril and maxzide.  BP Readings from Last 3 Encounters:  02/03/18 140/82  01/06/18 (!) 146/78  12/21/17 133/89          Respiratory   OSA (obstructive sleep apnea)    Sleep study done 01/2018: mild OSA. CPAP recommended          Follow-up: Return in about 4 months (around 06/04/2018) for CPE (fasting).  Wilfred Lacy, NP

## 2018-02-03 NOTE — Assessment & Plan Note (Signed)
Sleep study done 01/2018: mild OSA. CPAP recommended

## 2018-02-17 NOTE — Addendum Note (Signed)
Addended by: Larey Seat on: 02/17/2018 12:30 PM   Modules accepted: Orders

## 2018-02-17 NOTE — Procedures (Signed)
PATIENT'S NAME:  Sherry [name], Malecki DOB:      05-02-1965      MR#:    [id number]     DATE OF RECORDING: 02/03/2018 REFERRING M.D.:  Steva Colder, NP Study Performed:   CPAP  Titration with expanded EEG montage HISTORY:  12-21-2017; Sherry [name] is a 53 y.o. AA right handed female patient who suffers from super obesity at BMI 55.1/kg.m2 in the setting of postsurgical resection of a pituitary adenoma with prolactin secretion. She takes Bromocriptine for about 20 years. From December 2018 on she progressively experienced less restful and less restorative sleep, and she wondered if high blood pressure was the cause. She return after PSG revealed OSA 01/19/2018- total APNEA/HYPOPNEA INDEX (AHI) was 15.5 /hour, REM AHI was 43.9 /hour, supine AHI was 24.0/h. The oxygen nadir was 74%. Time spent below 89% saturation equaled 35 minutes. The patient reported spells of alteration of awareness, and HTN. The patient endorsed the Epworth Sleepiness Scale at 11 points.   The patient's weight 311 pounds with a height of 63 (inches), resulting in a BMI of 55.1 kg/m2. The patient's neck circumference measured 17 inches.  CURRENT MEDICATIONS: Norvasc, Bromocriptine, Vitamin D3, Colace, Melatonin, Multivitamin, fish oil, Kenalog, Maxzide  PROCEDURE:  This is a multichannel digital polysomnogram utilizing the SomnoStar 11.2 system.  Electrodes and sensors were applied and monitored per AASM Specifications.   EEG, EOG, Chin and Limb EMG, were sampled at 200 Hz.  ECG, Snore and Nasal Pressure, Thermal Airflow, Respiratory Effort, CPAP Flow and Pressure, Oximetry was sampled at 50 Hz. Digital video and audio were recorded.   Before the patient was asleep, while and after bio-calibrations were performed, there was evidence of epileptiform activity on EEG, this repeatedly seen within the first 12 minutes of the study and followed by sinus tachycardia.    CPAP was initiated at 5 cmH20 with heated humidity per AASM split  night standards and pressure was advanced to 12 cmH20 because of hypopneas, apneas and desaturations.  At none of the CPAP pressures was a reduction of the AHI to below 3.8/h achieved. The patient was very sensitive to changes in the CPAP, leading to frequent arousals.  The last explored pressure was 12 cm water but only 0.5 minutes of sleep were recorded. Lights Out was at 21:20 and Lights On at 05:00. Total recording time (TRT) was 460.5 minutes, with a total sleep time (TST) of 326.5 minutes. The patient's sleep latency was 32.5 minutes. REM latency was 65 minutes.  The sleep efficiency was 70.9 %.    SLEEP ARCHITECTURE: WASO (Wake after sleep onset) was 125.5 minutes.  There were 22 minutes in Stage N1, 164.5 minutes Stage N2, 68.5 minutes Stage N3 and 71.5 minutes in Stage REM.  The percentage of Stage N1 was 6.7%, Stage N2 was 50.4%, Stage N3 was 21.0%, and Stage R (REM sleep) was 21.9%. The sleep architecture was notable for high fragmentation.   RESPIRATORY ANALYSIS:  There was a total of 39 respiratory events: These were 39 hypopneas with a hypopnea index of 7.2/hour. The patient also had no respiratory event related arousals (RERAs).     The total APNEA/HYPOPNEA INDEX (AHI) was 7.2 /h. 25 events occurred in REM sleep and 14 events in NREM. The REM AHI was 21.0 /hour versus a non-REM AHI of 3.3 /hour. The patient spent 92 minutes of total sleep time in the supine position and 235 minutes in non-supine. The supine AHI was 9.1, versus a non-supine AHI of 6.4/h.  OXYGEN  SATURATION & C02:  The baseline 02 saturation was 96%, with the lowest being 82%. Time spent below 89% saturation equaled 6 minutes.  PERIODIC LIMB MOVEMENTS:  The patient had a total of 27 Periodic Limb Movements. The Periodic Limb Movement (PLM) index was 5.0/h and the PLM Arousal index was 0.0 /hour.   Audio and video analysis did not show any abnormal or unusual movements, behaviors, phonations or vocalizations. EKG was in  keeping with sinus rhythm.   The patient was fitted with nasal Eson Mask.  DIAGNOSIS 1. Obstructive Sleep Apnea with only partial response to CPAP titration. 2. Abnormal EEG, suspicious of seizure activity, followed by tachycardia.   PLANS/RECOMMENDATIONS:   1. Follow-up with neurologist for seizure activity. 2. Start on auto CPAP with a high EPR ( Expiratory pressure relieve) for comfort. 5-13 cm water with 3 cm EPR. I like for her to try a bell swift with ear loops.  3. The patient should be evaluated for her metabolic rate - super obesity in the setting of abnormal endocrinology, status post pituitary adenoma resection.     DISCUSSION: Please make an appointment for Sherry [name] ASAP.  A follow up appointment will be scheduled in the Sleep Clinic at [facility].   Please call [phone] with any questions.     I certify that I have reviewed the entire raw data recording prior to the issuance of this report in accordance with the Standards of Accreditation of the American Academy of Sleep Medicine (AASM)   Larey Seat, M.D.  02-17-2018 Diplomat, American Board of Psychiatry and Neurology  Diplomat, [place] of [employer] [facility] Medical Director, [place] [facility] at [facility]

## 2018-02-21 ENCOUNTER — Telehealth: Payer: Self-pay | Admitting: *Deleted

## 2018-02-21 ENCOUNTER — Telehealth: Payer: Self-pay | Admitting: Neurology

## 2018-02-21 MED ORDER — LEVETIRACETAM 500 MG PO TABS: 500.0000 mg | ORAL_TABLET | Freq: Two times a day (BID) | ORAL | 5 refills | Status: DC

## 2018-02-21 NOTE — Telephone Encounter (Signed)
[place] copy

## 2018-02-21 NOTE — Telephone Encounter (Signed)
Copied from [place]. Topic: General - Other >> Feb 21, 2018 10:23 AM Leward Quan A wrote: Reason for CRM: Patient called to say that Wilfred Lacy will be receiving sleep study results from Dr Asencion Partridge Dohmier and would like these results passed on to Dr Ardyth Man Endocrinologist please. Any questions please call patient at Ph# [phone]

## 2018-02-21 NOTE — Telephone Encounter (Signed)
Charlotte please see chart, do not have hard copy to give you. Her endo is in Epic as well.

## 2018-02-21 NOTE — Telephone Encounter (Signed)
-----   Message from Larey Seat, MD sent at 02/17/2018 12:30 PM EST ----- Sherry [name], she  will need to start an anti epileptic medication, abnormal EEG at sleep onset and followed by  Tachycardia was highly suspicious of seizure activity.   PCP will also need to know that she has not responded well to CPAP, we can either ask her back for a BiPAP trial , or give her a trial auto CPAP with high EPR at home , and nasal interface I like for her to try the  Auto CPAP and if not tolerated can always go to biPAP titration. She did numerically well at 10 cm CPAP but slept little.   She should also under metabolic testing with Dr Leafy Ro or another obesity specialist.  DIAGNOSIS 1. Obstructive Sleep Apnea with only partial response to CPAP  titration. 2. Abnormal EEG, suspicious of seizure activity, followed by  tachycardia.   PLANS/RECOMMENDATIONS:   1. Follow-up with neurologist for seizure activity. 2. Start on auto CPAP with a high EPR ( Expiratory pressure  relieve) for comfort. 5-13 cm water with 3 cm EPR. I like for her  to try a bell swift with ear loops.  3. The patient should be evaluated for her metabolic rate - super  obesity in the setting of abnormal endocrinology, status post  pituitary adenoma resection.

## 2018-02-21 NOTE — Telephone Encounter (Signed)
I called pt. I advised pt that Dr. Brett Fairy reviewed their sleep study results and found that pt has sleep apnea and was not completely treated with CPAP but was reduced at a pressure of 12 cm water pressure. Dr. Brett Fairy recommends that pt starts auto CPAP 5-13 cm water pressure. I reviewed PAP compliance expectations with the pt. Pt is agreeable to starting a CPAP. I advised pt that an order will be sent to a DME, [facility], and [facility] will call the pt within about one week after they file with the pt's insurance. [facility] will show the pt how to use the machine, fit for masks, and troubleshoot the CPAP if needed. A follow up appt was made for insurance purposes with Dr. Brett Fairy on April 1,2020 at 8:30 am. Dr Brett Fairy also noted there was seizure like activity on the EEG before the patient fell asleep and is recommending the patient start Keppra 500 mg BID to start until we can meet in a office visit. The April 1st apt is first available but I will place the patient on the wait list and will notify her if something comes available sooner.  Pt verbalized understanding to arrive 15 minutes early and bring their CPAP. A letter with all of this information in it will be mailed to the pt as a reminder. I verified with the pt that the address we have on file is correct. Pt verbalized understanding of results. Pt had no questions at this time but was encouraged to call back if questions arise. I have sent the order to [facility] and have received confirmation that they have received the order.

## 2018-02-21 NOTE — Telephone Encounter (Signed)
Record fax to Dr. Phylliss Blakes.

## 2018-02-22 ENCOUNTER — Encounter: Payer: Self-pay | Admitting: Neurology

## 2018-02-22 ENCOUNTER — Ambulatory Visit: Payer: Federal, State, Local not specified - PPO | Admitting: Neurology

## 2018-02-22 VITALS — BP 142/96 | HR 78 | Ht 63.0 in | Wt 313.0 lb

## 2018-02-22 DIAGNOSIS — G4733 Obstructive sleep apnea (adult) (pediatric): Secondary | ICD-10-CM | POA: Diagnosis not present

## 2018-02-22 DIAGNOSIS — R9401 Abnormal electroencephalogram [EEG]: Secondary | ICD-10-CM | POA: Diagnosis not present

## 2018-02-22 DIAGNOSIS — D352 Benign neoplasm of pituitary gland: Secondary | ICD-10-CM

## 2018-02-22 DIAGNOSIS — E229 Hyperfunction of pituitary gland, unspecified: Secondary | ICD-10-CM

## 2018-02-22 DIAGNOSIS — D353 Benign neoplasm of craniopharyngeal duct: Secondary | ICD-10-CM

## 2018-02-22 NOTE — Patient Instructions (Signed)
Levetiracetam tablets What is this medicine? LEVETIRACETAM (lee ve tye RA se tam) is an antiepileptic drug. It is used with other medicines to treat certain types of seizures. This medicine may be used for other purposes; ask your health care provider or pharmacist if you have questions. COMMON BRAND NAME(S): Keppra, Roweepra What should I tell my health care provider before I take this medicine? They need to know if you have any of these conditions: -kidney disease -suicidal thoughts, plans, or attempt; a previous suicide attempt by you or a family member -an unusual or allergic reaction to levetiracetam, other medicines, foods, dyes, or preservatives -pregnant or trying to get pregnant -breast-feeding How should I use this medicine? Take this medicine by mouth with a glass of water. Follow the directions on the prescription label. Swallow the tablets whole. Do not crush or chew this medicine. You may take this medicine with or without food. Take your doses at regular intervals. Do not take your medicine more often than directed. Do not stop taking this medicine or any of your seizure medicines unless instructed by your doctor or health care professional. Stopping your medicine suddenly can increase your seizures or their severity. A special MedGuide will be given to you by the pharmacist with each prescription and refill. Be sure to read this information carefully each time. Contact your pediatrician or health care professional regarding the use of this medication in children. While this drug may be prescribed for children as young as 4 years of age for selected conditions, precautions do apply. Overdosage: If you think you have taken too much of this medicine contact a poison control center or emergency room at once. NOTE: This medicine is only for you. Do not share this medicine with others. What if I miss a dose? If you miss a dose, take it as soon as you can. If it is almost time for your  next dose, take only that dose. Do not take double or extra doses. What may interact with this medicine? This medicine may interact with the following medications: -carbamazepine -colesevelam -probenecid -sevelamer This list may not describe all possible interactions. Give your health care provider a list of all the medicines, herbs, non-prescription drugs, or dietary supplements you use. Also tell them if you smoke, drink alcohol, or use illegal drugs. Some items may interact with your medicine. What should I watch for while using this medicine? Visit your doctor or health care professional for a regular check on your progress. Wear a medical identification bracelet or chain to say you have epilepsy, and carry a card that lists all your medications. It is important to take this medicine exactly as instructed by your health care professional. When first starting treatment, your dose may need to be adjusted. It may take weeks or months before your dose is stable. You should contact your doctor or health care professional if your seizures get worse or if you have any new types of seizures. You may get drowsy or dizzy. Do not drive, use machinery, or do anything that needs mental alertness until you know how this medicine affects you. Do not stand or sit up quickly, especially if you are an older patient. This reduces the risk of dizzy or fainting spells. Alcohol may interfere with the effect of this medicine. Avoid alcoholic drinks. The use of this medicine may increase the chance of suicidal thoughts or actions. Pay special attention to how you are responding while on this medicine. Any worsening of mood, or   thoughts of suicide or dying should be reported to your health care professional right away. Women who become pregnant while using this medicine may enroll in the [place] Pregnancy Registry by calling [phone]. This registry collects information about the safety of  antiepileptic drug use during pregnancy. What side effects may I notice from receiving this medicine? Side effects that you should report to your doctor or health care professional as soon as possible: -allergic reactions like skin rash, itching or hives, swelling of the face, lips, or tongue -breathing problems -dark urine -general ill feeling or flu-like symptoms -problems with balance, talking, walking -unusually weak or tired -worsening of mood, thoughts or actions of suicide or dying -yellowing of the eyes or skin Side effects that usually do not require medical attention (report to your doctor or health care professional if they continue or are bothersome): -diarrhea -dizzy, drowsy -headache -loss of appetite This list may not describe all possible side effects. Call your doctor for medical advice about side effects. You may report side effects to FDA at 1-800-FDA-1088. Where should I keep my medicine? Keep out of reach of children. Store at room temperature between 15 and 30 degrees C (59 and 86 degrees F). Throw away any unused medicine after the expiration date. NOTE: This sheet is a summary. It may not cover all possible information. If you have questions about this medicine, talk to your doctor, pharmacist, or health care provider.  2019 Elsevier/Gold Standard (2015-02-20 09:43:54)

## 2018-02-22 NOTE — Progress Notes (Signed)
SLEEP MED[facility]INE CL[facility]IC   Provider:  Larey Seat, MD    Primary Care Physician:  Arnette Norris, MD   Referring Provider: Flossie Buffy, NP   Chief Complaint  [name] presents with  . Follow-up    pt with husband, rm 61. pt here to discuss sleep study results and Sherry seizure activty that was noted as well as Sherry medication prescribed.     RV 02-22-2018- [name] seen here to discuss Sherry OSA diagnosis, and Sherry finding of abnormal EEG activity.  I would like to summarize Sherry results of Sherry baseline polysomnography with an expanded EEG montage performed on 19 January 2018.  Sherry [name] was diagnosed with a mild to moderate degree of obstructive sleep apnea, her AHI was 15.5 during REM sleep exacerbated to 43.9/h and there was not a lot of supine sleep seen to see a positional accentuation.  Oxygen saturation below 89% was for 35 minutes noted this is not consecutive but a total time and desaturation and her nadir was 74% saturation. CPAP therapy is usually Sherry best therapy for REM sleep dependent apnea on for this reason she returned on 03 February 2018, this time for successful reduction of apnea on Sherry CPAP.  She was very sensitive to even small changes in Sherry CPAP pressure and had initially frequent arousals.  She tolerated however a pressure of 12 cmH2O as a final pressure quite well.  More interesting to me was that during and while she was seen by her calibrations there was evidence of epileptiform activity on her EEG this was seen repeatedly within Sherry first 12 minutes of Sherry EEG study and each of these was followed by sinus tachycardia.  I did not see Sherry spells arising out of sleep but before Sherry [name] was asleep.  I wrote for her to use an auto titration machine which she has not yet received, but she is told me that she actually felt better rested and more able to concentrate and focus after her CPAP titration study.  Due to Sherry abnormal EEG findings we are meeting today to possibly  start medication. We reviewed Sherry EEG print outs, spike wave 3-5 herz, lasting between 2 and 12 seconds.  Sherry [name] felt "blurred vision and fogginess" with Sherry last and longest spell. She had falls at home, she has memory blips, her left leg is involuntary moving or giving out.  We will start [place].   HPI:  Sherry [name] is a 53 y.o. AA right handed  female [name] here with her husband on 12-21-2017 , in a referral  from NP. [place] for insomnia.   Sherry [name] reports having experienced a severe vertigo spell in December 2018 for which she took a while to recover.  Her husband had to help her as she could not walk steady on her own.  She experienced a counterclockwise rotation of Sherry surroundings and was heavily nauseated unable to focus her gaze.  From December on she progressively experienced less restful and less restorative sleep, and she  wondered if high blood pressure was Sherry cause.  She had a wellness and full physical exam in spring 2019 when her primary care physician noted Sherry elevated blood pressure and begun treating her with medication.  She was asked to keep a blood pressure journal starting, and by September was finally beginning treatment with medication which has since then been augmented.  She is now taking Maxide a mixture of triamterene and hydrochlorothiazide as well as Norvasc-amlodipine 5 mg daily.  She has a history of a pituitary adenoma that secreted prolactin, and for this reason has been for 20 years now taking bromocriptine 2.5 mg daily.  We did she have Sherry tumor removed in 1996.  Just this morning she had a follow-up and to look again at her pituitary gland. Her BP has been controlled and she is now more sleepy, her nocturnal sleep has improved, is less fragamented and yet she is still not well rested.    Chief complaint according to [name] : " I feel fatigued, more sleepy- insomnia is improved. "  Sleep habits are as follows: lunch and Dinner time is variable, she  works as a Therapist, nutritional, has students and eats on Sherry run. Her husband is teaching at [employer]. Often eats later than 7 pm.  Bedtime is whenever her husband Delfino Lovett comes home, at 9.30 PM .  She is asleep promptly . Her bedroom is cool, quiet and dark- Sherry couple is building and resides in a rental home meanwhile.  No TV in Sherry bedroom.  Sleeps on Sherry right side, ending up on her back on 2 pillows, stays asleep 3-4 hours before nocturia,  twice nightly. She wakes up with a dry mouth, drinks water - rises at 7 AM. Feels not refreshed. Dry mouth again, no headaches, sometimes lightheadedness.     Sleep medical history and family sleep history: pituitary adenoma, incompletely resected. HTN, super-obesity.   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 Vegetarian.   Review of Systems: Out of a complete 14 system review, Sherry [name] complains of only Sherry following symptoms, and all other reviewed systems are negative.  GERD, acid reflux,  Epworth Sleepiness score 11/ 24 , Fatigue severity score 45/ 63   , depression score n/a   Social History   Socioeconomic History  . Marital status: Married    Spouse name: Not on file  . Number of children: 0  . Years of education: Not on file  . Highest education level: Not on file  Occupational History  . Not on file  Social Needs  . Financial resource strain: Not on file  . Food insecurity:    Worry: Not on file    Inability: Not on file  . Transportation needs:    Medical: Not on file    Non-medical: Not on file  Tobacco Use  . Smoking status: Never Smoker  . Smokeless tobacco: Never Used  Substance and Sexual Activity  . Alcohol use: Never    Frequency: Never  . Drug use: Never  . Sexual activity: Yes  Lifestyle  . Physical activity:    Days per week: Not on file    Minutes per session: Not on file  . Stress: Not on  file  Relationships  . Social connections:    Talks on phone: Not on file    Gets together: Not on file    Attends religious service: Not on file    Active member of club or organization: Not on file    Attends meetings of clubs or organizations: Not on file    Relationship status: Not on file  . Intimate partner violence:    Fear of current or ex partner: Not on file    Emotionally abused: Not on file    Physically abused: Not on file    Forced sexual activity: Not on file  Other Topics Concern  .  Not on file  Social History Narrative  . Not on file    Family History  Problem Relation Age of Onset  . Hypertension Mother   . Heart attack Father 43       result of DVT  . Pulmonary embolism Father   . Hypertension Maternal Aunt   . Cancer Maternal Aunt        breast cancer  . CVA Maternal Aunt 70  . Stroke Maternal Aunt 70  . Breast cancer Maternal Aunt   . Hypertension Maternal Uncle     Past Medical History:  Diagnosis Date  . Brain tumor (benign) ([place]) 1996   benign pituitary adenoma  . Chickenpox     Past Surgical History:  Procedure Laterality Date  . CRANIECTOMY / CRANIOTOMY FOR EXCISION OF BRAIN TUMOR  1996   pituitary adenoma  . WISDOM TOOTH EXTRACTION      Current Outpatient Medications  Medication Sig Dispense Refill  . bromocriptine (PARLODEL) 2.5 MG tablet TAKE ONE (1) TABLET BY MOUTH ONCE DAILY    . Cholecalciferol (VITAMIN D3) 5000 units TABS Take by mouth.    . levETIRAcetam (KEPPRA) 500 MG tablet Take 1 tablet (500 mg total) by mouth 2 (two) times daily. 60 tablet 5  . lisinopril (PRINIVIL,ZESTRIL) 5 MG tablet Take 1 tablet (5 mg total) by mouth daily. 30 tablet 1  . MELATONIN PO Take by mouth. Help with sleep PRN    . Multiple Vitamins-Minerals (MULTIVITAMIN ADULT PO) Take by mouth.    . Omega-3 Fatty Acids (FISH OIL CONCENTRATE) 300 MG CAPS Take 500 mg by mouth.    . triamcinolone cream (KENALOG) 0.5 % Apply 1 application topically 2 (two)  times daily. 15 g 5  . triamterene-hydrochlorothiazide (MAXZIDE-25) 37.5-25 MG tablet Take 0.5 tablets by mouth daily. 90 tablet 1   No current facility-administered medications for this visit.     Allergies as of 02/22/2018 - Review Complete 02/22/2018  Allergen Reaction Noted  . Aspirin Rash and Nausea And Vomiting 08/10/2011  . Amlodipine Other (See Comments) 01/06/2018    Vitals: BP (!) 142/96   Pulse 78   Ht 5\' 3"  (1.6 m)   Wt (!) 313 lb (142 kg)   BMI 55.45 kg/m  Last Weight:  Wt Readings from Last 1 Encounters:  02/22/18 (!) 313 lb (142 kg)   AVW:UJWJ mass index is 55.45 kg/m.     Last Height:   Ht Readings from Last 1 Encounters:  02/22/18 5\' 3"  (1.6 m)    Physical exam:  General: Sherry [name] is awake, alert and appears not in acute distress. Sherry [name] is well groomed. Head: Normocephalic, atraumatic. Neck is supple. Mallampati  3, neck: 17"  neck circumference:17" . Nasal airflow patent ,  TMJ is evident . Retrognathia is not seen . Retainer - every night for invisa-line.  Cardiovascular:   Regular rate and rhythm, without murmurs or carotid bruit, and without distended neck veins. Respiratory: Lungs are clear to auscultation.-   Skin: ankle edema, not rash.  Trunk: BMI is 55. Sherry [name]'s posture is erect.   Neurologic exam : Sherry [name] is awake and alert, oriented to place and time.   Memory subjective described as intact.   Attention span & concentration ability appears impaired, logorrhoeic and off target, tangential, distracted. Speech is fluent,  without  dysarthria, dysphonia or aphasia.   Cranial nerves: Pupils are equal and briskly reactive to light. Extraocular movements  in vertical and horizontal planes intact and without nystagmus.  She reports impaired depth perception. Visual fields by finger perimetry were intact- there may be a loss of peripheral vision on Sherry right side. ???. She has a compensatory head position , leaning Sherry head to Sherry  left and turning slightly right.  Hearing to finger rub intact.   Facial sensation intact to fine touch.  Facial motor strength is symmetric and tongue and uvula move midline. Shoulder shrug was symmetrical.   Motor exam:  Normal tone, muscle bulk and symmetric strength in all extremities.  Sensory:  Fine touch, pinprick and vibration were tested in all extremities. Proprioception tested in Sherry upper extremities was normal. Coordination: Rapid alternating movements in Sherry fingers/hands was normal. Finger-to-nose maneuver  normal without evidence of ataxia, dysmetria or tremor. Gait and station: [name] walks without assistive device and is able unassisted to climb up to Sherry exam table. Strength within normal limits. Stance is wider based , but stable .  Deep tendon reflexes: in Sherry upper and lower extremities are symmetric and intact. Babinski maneuver response is equivocal.   Assessment:  After physical and neurologic examination, review of laboratory studies,  Personal review of imaging studies, reports of other /same  Imaging studies, results of polysomnography and / or neurophysiology testing and pre-existing records as far as provided in visit., my assessment is    1) seizure disorder - following  Pituitary adenoma.   2) OSA , needs CPAP. This may help her seizures too.    Sherry [name] was advised of Sherry nature of Sherry diagnosed disorder , Sherry treatment options and Sherry  risks for general health and wellness arising from not treating Sherry condition.   I spent more than 45 minutes of face to face time with Sherry [name].  Greater than 50% of time was spent in counseling and coordination of care. We have discussed Sherry diagnosis and differential and I answered Sherry [name]'s questions.    Plan:  Treatment plan and additional workup :  OSA- super-obese,and fatigued, snorer, large neck and small upper airway.  Needs to start CPAP- ASAP=- order is in.  History of extrasellar tumor/ adenoma=  this can explain human growth hormone secretion aside from prolactin in her pituitary adenoma.   Larey Seat, MD 9/93/7169, 6:78 PM  Certified in Neurology by ABPN Certified in [place] by [facility] [address] [place], [place] [address]

## 2018-03-03 DIAGNOSIS — G4733 Obstructive sleep apnea (adult) (pediatric): Secondary | ICD-10-CM | POA: Diagnosis not present

## 2018-03-08 DIAGNOSIS — Z8639 Personal history of other endocrine, nutritional and metabolic disease: Secondary | ICD-10-CM | POA: Insufficient documentation

## 2018-03-08 DIAGNOSIS — R5383 Other fatigue: Secondary | ICD-10-CM | POA: Insufficient documentation

## 2018-03-13 ENCOUNTER — Other Ambulatory Visit: Payer: Self-pay | Admitting: Nurse Practitioner

## 2018-03-13 DIAGNOSIS — I10 Essential (primary) hypertension: Secondary | ICD-10-CM

## 2018-03-22 ENCOUNTER — Ambulatory Visit: Payer: Federal, State, Local not specified - PPO | Admitting: Nurse Practitioner

## 2018-04-01 DIAGNOSIS — G4733 Obstructive sleep apnea (adult) (pediatric): Secondary | ICD-10-CM | POA: Diagnosis not present

## 2018-04-21 ENCOUNTER — Other Ambulatory Visit: Payer: Federal, State, Local not specified - PPO

## 2018-04-21 DIAGNOSIS — G4733 Obstructive sleep apnea (adult) (pediatric): Secondary | ICD-10-CM | POA: Diagnosis not present

## 2018-05-01 ENCOUNTER — Other Ambulatory Visit: Payer: Self-pay | Admitting: Nurse Practitioner

## 2018-05-01 DIAGNOSIS — I10 Essential (primary) hypertension: Secondary | ICD-10-CM

## 2018-05-02 ENCOUNTER — Telehealth: Payer: Self-pay

## 2018-05-02 NOTE — Telephone Encounter (Signed)
Pt called back to give consent for a phone visit  CB# [phone]

## 2018-05-02 NOTE — Telephone Encounter (Signed)
Nurse spoke with pt to explain the COVID 19 concerns. Doing web visit or telephone visit. [place] payment will be waive. Insurance will be file. Pt consented to file insurance and do tele visit. PT states she does not have a computer, lap top, tablet, or smart phone. Consent was obtained for appt and file insurance from patient.

## 2018-05-02 NOTE — Telephone Encounter (Signed)
Left message on pts cell number about appt tomorrow. Explain due to COVID 19 only doing web video visit or telephone visit. LEft message to call back.Need consent for pt to do video visit and file insurance. [place] pavement will be waive for visit.

## 2018-05-03 ENCOUNTER — Encounter: Payer: Self-pay | Admitting: Adult Health

## 2018-05-03 ENCOUNTER — Ambulatory Visit: Payer: Self-pay | Admitting: Neurology

## 2018-05-03 ENCOUNTER — Ambulatory Visit (INDEPENDENT_AMBULATORY_CARE_PROVIDER_SITE_OTHER): Payer: Federal, State, Local not specified - PPO | Admitting: Adult Health

## 2018-05-03 ENCOUNTER — Other Ambulatory Visit: Payer: Self-pay

## 2018-05-03 DIAGNOSIS — G4733 Obstructive sleep apnea (adult) (pediatric): Secondary | ICD-10-CM | POA: Diagnosis not present

## 2018-05-03 DIAGNOSIS — R9401 Abnormal electroencephalogram [EEG]: Secondary | ICD-10-CM | POA: Diagnosis not present

## 2018-05-03 DIAGNOSIS — D352 Benign neoplasm of pituitary gland: Secondary | ICD-10-CM | POA: Diagnosis not present

## 2018-05-03 NOTE — Progress Notes (Signed)
Sherry [name], Sherry [name], Sherry [name] of performing an evaluation and management service by telephone and the availability of in person appointments. I also discussed with the patient that there may be a patient responsible charge related to this service. The patient expressed understanding and agreed to proceed.   History of Present Illness:  Sherry [name] is a 53 y.o. female with recent diagnosis of OSA and CPAP initiation. She was initially scheduled for face-to-face office visit today at this time for initial CPAP compliance visit but due to [place], face-to-face office visit rescheduled for non-face-to-face telephone visit.    CPAP compliance report from 04/03/2018 -05/02/2018 shows 30 out of 30 usage days with 30 days greater than 4 hours for 100% compliance.  Average usage 9 hours and 1 minute with residual AHI 1.9.  Leaks in the 95th percentile 17.7.  Pressure in the 95th percentile 11.8 with minimum pressure 5 cm H2O and max pressure 13 cm H2O with a EPR 3.  She reports doing exceptionally well on CPAP with overall improvement of her energy level and sleep quality at night.  She denies any additional symptoms of vertigo or dizziness as she was having these symptoms prior to initiating CPAP.  She feels as though she is more energized during the day and is planning on getting into an exercise/workout routine as she had difficulty prior due to lack of energy.  She does have [name] regarding her current DME company [employer] as she has been experiencing difficulties reaching them and will have to make multiple  phone calls.    Epworth Sleepiness Scale: 5/24 (today's visit) Epworth Sleepiness Scale: 11/24 (12/21/2017)  Of note, during titration study EEG abnormalities identified and discussion with Dr. Brett Fairy regarding potentially initiating AED but decided on using CPAP first as she does have a history of pituitary adenoma.  She states she followed up with her endocrinologist who recommended not initiating AED unless repeat sleep study or EEG obtained to assess for additional seizure activity or abnormalities.     Observations/Objective:  *Limited exam due to type of visit*  General: Very pleasant middle-aged African-American female asking and answering appropriate questions throughout conversation   Nocturnal polysomnography with REM behavior disorder 01/19/2018 IMPRESSION: 1. Mild Obstructive Sleep Apnea (OSA) at AHI 15.5/h with  significant accentuation during REM sleep to AHI 43.9/h.  2. Associated hypoxemia of 35 minutes total sleep time was less  than 10 % of total sleep time and clinically not significant. The patient did not have obesity hypoventilation.  3. Primary Snoring and more apnea in supine sleep, which the  patient already avoided.   CPAP titration 02/03/2018 DIAGNOSIS 1. Obstructive Sleep Apnea with only partial response to CPAP  titration. 2. Abnormal EEG, suspicious of seizure activity, followed by  tachycardia.  PLANS/RECOMMENDATIONS: 1. Follow-up with neurologist for seizure activity. 2. Start on auto CPAP with a high EPR ( Expiratory pressure  relieve) for comfort. 5-13 cm water with 3 cm EPR. I like for her  to try a bell swift with ear loops.  3. The patient should be evaluated for her metabolic rate -  super  obesity in the setting of abnormal endocrinology, status post  pituitary adenoma resection.  Assessment and Plan:  Sherry [name] is a 53 year old female with recent diagnosis of OSA with initiation of CPAP for management.  She was also found to  have EEG abnormalities during sleep study.  Since initiating CPAP, she has greatly benefited in regards to daytime fatigue, insomnia and symptoms including vertigo/dizziness.  We will continue current CPAP settings as no indication to adjust at this time.  She will continue to follow with her endocrinologist regarding underlying history of pituitary adenoma.  DME company [employer] who will continue to supply any needed equipment.  Advised her that Dr. Brett Fairy will decide future need of repeat sleep study or EEG to assess for abnormalities and potential future need for AED.  Follow Up Instructions:   Follow-up with Jinny Blossom, NP in 6 months or call earlier if needed    I discussed the assessment and treatment plan with the patient.  The patient was provided an opportunity to ask questions and all were answered to their satisfaction. The patient agreed with the plan and verbalized an understanding of the instructions.   I provided 28 minutes of non-face-to-face time during this encounter.    Venancio Poisson, AGNP-BC  [facility] [address] [place], [place] [address]  Phone [phone] Fax [phone] Note: This document was prepared with digital dictation and possible smart phrase technology. Any transcriptional errors that result from this process are unintentional.

## 2018-05-10 ENCOUNTER — Other Ambulatory Visit: Payer: Federal, State, Local not specified - PPO

## 2018-06-01 DIAGNOSIS — G4733 Obstructive sleep apnea (adult) (pediatric): Secondary | ICD-10-CM | POA: Diagnosis not present

## 2018-06-06 ENCOUNTER — Encounter: Payer: Federal, State, Local not specified - PPO | Admitting: Nurse Practitioner

## 2018-06-12 ENCOUNTER — Other Ambulatory Visit: Payer: Self-pay | Admitting: Nurse Practitioner

## 2018-06-12 DIAGNOSIS — L2082 Flexural eczema: Secondary | ICD-10-CM

## 2018-06-12 DIAGNOSIS — I10 Essential (primary) hypertension: Secondary | ICD-10-CM

## 2018-06-12 NOTE — Telephone Encounter (Signed)
Needs video appt

## 2018-06-14 ENCOUNTER — Ambulatory Visit (INDEPENDENT_AMBULATORY_CARE_PROVIDER_SITE_OTHER): Payer: Federal, State, Local not specified - PPO | Admitting: Nurse Practitioner

## 2018-06-14 ENCOUNTER — Encounter: Payer: Self-pay | Admitting: Nurse Practitioner

## 2018-06-14 VITALS — BP 133/85 | HR 76 | Temp 97.2°F | Ht 63.0 in | Wt 315.2 lb

## 2018-06-14 DIAGNOSIS — I10 Essential (primary) hypertension: Secondary | ICD-10-CM | POA: Diagnosis not present

## 2018-06-14 DIAGNOSIS — R202 Paresthesia of skin: Secondary | ICD-10-CM

## 2018-06-14 DIAGNOSIS — L2082 Flexural eczema: Secondary | ICD-10-CM

## 2018-06-14 MED ORDER — LISINOPRIL 5 MG PO TABS: 5.0000 mg | ORAL_TABLET | Freq: Every day | ORAL | 1 refills | Status: DC

## 2018-06-14 MED ORDER — TRIAMCINOLONE ACETONIDE 0.5 % EX CREA: 1.0000 "application " | TOPICAL_CREAM | Freq: Two times a day (BID) | CUTANEOUS | 0 refills | Status: DC

## 2018-06-14 MED ORDER — TRIAMTERENE-HCTZ 37.5-25 MG PO TABS: 0.5000 | ORAL_TABLET | Freq: Every day | ORAL | 1 refills | Status: DC

## 2018-06-14 NOTE — Progress Notes (Signed)
Virtual Visit via Telephone Note  I connected with Sherry [name] on 06/14/18 at 10:00 AM EDT by telephone and verified that I am speaking with the correct person using two identifiers.  Location: Patient: home Provider: office   I discussed the limitations, risks, security and privacy concerns of performing an evaluation and management service by telephone and the availability of in person appointments. I also discussed with the patient that there may be a patient responsible charge related to this service. The patient expressed understanding and agreed to proceed.  CC: follow up on BP and kenalog cream refills? pt complaining of burning,thingling sensation in feet,hard to walk at times,more at night/ going on 1 1/2 wk/ FYI--pt is on cpap machine now.  History of Present Illness: She reports bilateral foot pain in last 1-2weeks, describes as burning sensation, radiates from toes to calf region, constant, worse with walking, denies any foot injury, denies any edema or erythema or joint swelling.   HTN: controlled with lisinopril, maxzide and compliance with CPAP BP Readings from Last 3 Encounters:  06/14/18 133/85  02/22/18 (!) 142/96  02/03/18 140/82   Wt Readings from Last 3 Encounters:  06/14/18 (!) 315 lb 3.2 oz (143 kg)  02/22/18 (!) 313 lb (142 kg)  02/03/18 (!) 319 lb 3.2 oz (144.8 kg)   Observations/Objective: No physical exam alert and oriented x 4, normal speech and voice.  Assessment and Plan: [place] was seen today for follow-up.  Diagnoses and all orders for this visit:  Essential hypertension -     triamterene-hydrochlorothiazide (MAXZIDE-25) 37.5-25 MG tablet; Take 0.5 tablets by mouth daily. -     lisinopril (ZESTRIL) 5 MG tablet; Take 1 tablet (5 mg total) by mouth daily.  Paresthesia of both feet -     Basic metabolic panel; Future -     TSH; Future -     CBC; Future -     B12 and Folate Panel; Future  Flexural eczema -     triamcinolone cream  (KENALOG) 0.5 %; Apply 1 application topically 2 (two) times daily.   Follow Up Instructions: Go to lab for blood draw.(lab requisition will be faxed to [facility] as requested by patient) Continue current medications. F/up in 23months   I discussed the assessment and treatment plan with the patient. The patient was provided an opportunity to ask questions and all were answered. The patient agreed with the plan and demonstrated an understanding of the instructions.   The patient was advised to call back or seek an in-person evaluation if the symptoms worsen or if the condition fails to improve as anticipated.  I provided 20 minutes of non-face-to-face time during this encounter.   Wilfred Lacy, NP

## 2018-06-15 ENCOUNTER — Other Ambulatory Visit: Payer: Self-pay | Admitting: Nurse Practitioner

## 2018-06-15 DIAGNOSIS — R202 Paresthesia of skin: Secondary | ICD-10-CM | POA: Diagnosis not present

## 2018-06-16 ENCOUNTER — Other Ambulatory Visit: Payer: Federal, State, Local not specified - PPO

## 2018-06-16 LAB — BASIC METABOLIC PANEL
BUN/Creatinine Ratio: 15 (ref 9–23)
BUN: 12 mg/dL (ref 6–24)
CO2: 21 mmol/L (ref 20–29)
Calcium: 9.5 mg/dL (ref 8.7–10.2)
Chloride: 103 mmol/L (ref 96–106)
Creatinine, Ser: 0.79 mg/dL (ref 0.57–1.00)
GFR calc Af Amer: 99 mL/min/{1.73_m2} (ref >59)
GFR calc non Af Amer: 86 mL/min/{1.73_m2} (ref >59)
Glucose: 95 mg/dL (ref 65–99)
Potassium: 4.3 mmol/L (ref 3.5–5.2)
Sodium: 140 mmol/L (ref 134–144)

## 2018-06-16 LAB — CBC
Hematocrit: 36.8 % (ref 34.0–46.6)
Hemoglobin: 12.3 g/dL (ref 11.1–15.9)
MCH: 29.1 pg (ref 26.6–33.0)
MCHC: 33.4 g/dL (ref 31.5–35.7)
MCV: 87 fL (ref 79–97)
Platelets: 386 10*3/uL (ref 150–450)
RBC: 4.23 x10E6/uL (ref 3.77–5.28)
RDW: 14 % (ref 11.7–15.4)
WBC: 13 10*3/uL — ABNORMAL HIGH (ref 3.4–10.8)

## 2018-06-16 LAB — B12 AND FOLATE PANEL
Folate: >20 ng/mL (ref >3.0)
Vitamin B-12: 924 pg/mL (ref 232–1245)

## 2018-06-23 ENCOUNTER — Other Ambulatory Visit: Payer: Federal, State, Local not specified - PPO

## 2018-07-26 ENCOUNTER — Other Ambulatory Visit: Payer: Federal, State, Local not specified - PPO

## 2018-07-27 DIAGNOSIS — G4733 Obstructive sleep apnea (adult) (pediatric): Secondary | ICD-10-CM | POA: Diagnosis not present

## 2018-09-13 ENCOUNTER — Other Ambulatory Visit: Payer: Federal, State, Local not specified - PPO

## 2018-09-19 ENCOUNTER — Ambulatory Visit: Payer: Federal, State, Local not specified - PPO | Admitting: Nurse Practitioner

## 2018-09-21 ENCOUNTER — Telehealth: Payer: Self-pay | Admitting: Nurse Practitioner

## 2018-09-21 NOTE — Telephone Encounter (Signed)

## 2018-09-22 ENCOUNTER — Other Ambulatory Visit: Payer: Self-pay | Admitting: Nurse Practitioner

## 2018-09-22 ENCOUNTER — Other Ambulatory Visit: Payer: Self-pay

## 2018-09-22 ENCOUNTER — Encounter: Payer: Self-pay | Admitting: Nurse Practitioner

## 2018-09-22 ENCOUNTER — Ambulatory Visit
Admission: RE | Admit: 2018-09-22 | Discharge: 2018-09-22 | Disposition: A | Discharge status: Home / Self Care | Payer: Federal, State, Local not specified - PPO | Source: Ambulatory Visit | Attending: Nurse Practitioner | Admitting: Nurse Practitioner

## 2018-09-22 ENCOUNTER — Ambulatory Visit: Payer: Federal, State, Local not specified - PPO | Admitting: Nurse Practitioner

## 2018-09-22 VITALS — BP 142/86 | HR 96 | Temp 97.9°F | Ht 63.0 in | Wt 318.0 lb

## 2018-09-22 DIAGNOSIS — R600 Localized edema: Secondary | ICD-10-CM | POA: Insufficient documentation

## 2018-09-22 DIAGNOSIS — I10 Essential (primary) hypertension: Secondary | ICD-10-CM

## 2018-09-22 DIAGNOSIS — R928 Other abnormal and inconclusive findings on diagnostic imaging of breast: Secondary | ICD-10-CM | POA: Diagnosis not present

## 2018-09-22 DIAGNOSIS — N939 Abnormal uterine and vaginal bleeding, unspecified: Secondary | ICD-10-CM

## 2018-09-22 DIAGNOSIS — N6312 Unspecified lump in the right breast, upper inner quadrant: Secondary | ICD-10-CM | POA: Diagnosis not present

## 2018-09-22 DIAGNOSIS — L2082 Flexural eczema: Secondary | ICD-10-CM

## 2018-09-22 DIAGNOSIS — E559 Vitamin D deficiency, unspecified: Secondary | ICD-10-CM | POA: Diagnosis not present

## 2018-09-22 DIAGNOSIS — Z23 Encounter for immunization: Secondary | ICD-10-CM | POA: Diagnosis not present

## 2018-09-22 DIAGNOSIS — N6311 Unspecified lump in the right breast, upper outer quadrant: Secondary | ICD-10-CM | POA: Diagnosis not present

## 2018-09-22 DIAGNOSIS — N63 Unspecified lump in unspecified breast: Secondary | ICD-10-CM

## 2018-09-22 DIAGNOSIS — Z6841 Body Mass Index (BMI) 40.0 and over, adult: Secondary | ICD-10-CM

## 2018-09-22 MED ORDER — TRIAMTERENE-HCTZ 37.5-25 MG PO TABS: 1.0000 | ORAL_TABLET | Freq: Every day | ORAL | 1 refills | Status: DC

## 2018-09-22 MED ORDER — TRIAMCINOLONE ACETONIDE 0.5 % EX CREA: 1.0000 "application " | TOPICAL_CREAM | Freq: Two times a day (BID) | CUTANEOUS | 1 refills | Status: DC

## 2018-09-22 NOTE — Patient Instructions (Addendum)
Increase maxzide to 1tab daily to help with LE edema.    Go to lab for blood.  You will be contacted to schedule appt for pelvic US.  You will be contacted to schedule appt with nutritionist

## 2018-09-22 NOTE — Progress Notes (Signed)
Subjective:  Patient ID: Sherry [name], female    DOB: May 17, 1965  Age: 53 y.o. MRN: [id number]  CC: Follow-up (follow up on BP--med refills--ok for tdap. )  HPI  HTN: Stable BP. Home BP reading: 139-140/80s persistent LE edema, no SOB, no PND, no dizziness BP Readings from Last 3 Encounters:  09/22/18 (!) 142/86  06/14/18 133/85  02/22/18 (!) 142/96   Wt Readings from Last 3 Encounters:  09/22/18 (!) 318 lb (144.2 kg)  06/14/18 (!) 315 lb 3.2 oz (143 kg)  02/22/18 (!) 313 lb (142 kg)   Vaginal bleeding: Ongoing for last 16months, waking and waning, no clots, Has increased fatigue. Last PAP 26yrs ago (normal).  Obesity: BMI 56.33 Concerned about increase weight despite compliance with CPAP, and low sodium diet.  Reviewed past Medical, Social and Family history today.  Outpatient Medications Prior to Visit  Medication Sig Dispense Refill  . bromocriptine (PARLODEL) 2.5 MG tablet TAKE ONE (1) TABLET BY MOUTH ONCE DAILY    . Cholecalciferol (VITAMIN D3) 5000 units TABS Take by mouth.    [name] lisinopril (ZESTRIL) 5 MG tablet Take 1 tablet (5 mg total) by mouth daily. 90 tablet 1  . MELATONIN PO Take by mouth. Help with sleep PRN    . Multiple Vitamins-Minerals (MULTIVITAMIN ADULT PO) Take by mouth.    . Omega-3 Fatty Acids (FISH OIL CONCENTRATE) 300 MG CAPS Take 500 mg by mouth.    . triamcinolone cream (KENALOG) 0.5 % Apply 1 application topically 2 (two) times daily. 454 g 0  . triamterene-hydrochlorothiazide (MAXZIDE-25) 37.5-25 MG tablet Take 0.5 tablets by mouth daily. 45 tablet 1  . levETIRAcetam (KEPPRA) 500 MG tablet Take 1 tablet (500 mg total) by mouth 2 (two) times daily. (Patient not taking: Reported on 06/14/2018) 60 tablet 5   No facility-administered medications prior to visit.     ROS See HPI  Objective:  BP (!) 142/86   Pulse 96   Temp 97.9 F (36.6 C) (Oral)   Ht 5\' 3"  (1.6 m)   Wt (!) 318 lb (144.2 kg)   SpO2 99%   BMI 56.33 kg/m   BP Readings  from Last 3 Encounters:  09/22/18 (!) 142/86  06/14/18 133/85  02/22/18 (!) 142/96    Wt Readings from Last 3 Encounters:  09/22/18 (!) 318 lb (144.2 kg)  06/14/18 (!) 315 lb 3.2 oz (143 kg)  02/22/18 (!) 313 lb (142 kg)    Physical Exam Vitals signs reviewed.  Cardiovascular:     Rate and Rhythm: Normal rate and regular rhythm.     Pulses: Normal pulses.     Heart sounds: Normal heart sounds.  Pulmonary:     Effort: Pulmonary effort is normal.     Breath sounds: Normal breath sounds.  Musculoskeletal:        General: Tenderness present. No deformity.     Right lower leg: Edema present.     Left lower leg: Edema present.  Skin:    Findings: No erythema or rash.  Neurological:     Mental Status: She is alert and oriented to person, place, and time.  Psychiatric:        Mood and Affect: Mood normal.        Behavior: Behavior normal.        Thought Content: Thought content normal.    Lab Results  Component Value Date   WBC 13.0 (H) 06/15/2018   HGB 12.3 06/15/2018   HCT 36.8 06/15/2018   PLT 386  06/15/2018   GLUCOSE 95 06/15/2018   CHOL 182 06/07/2017   TRIG 83.0 06/07/2017   HDL 54.20 06/07/2017   LDLCALC 111 (H) 06/07/2017   ALT 12 06/07/2017   AST 15 06/07/2017   NA 140 06/15/2018   K 4.3 06/15/2018   CL 103 06/15/2018   CREATININE 0.79 06/15/2018   BUN 12 06/15/2018   CO2 21 06/15/2018   TSH 1.06 06/07/2017    Assessment & Plan:   Lynell was seen today for follow-up.  Diagnoses and all orders for this visit:  Essential hypertension -     triamterene-hydrochlorothiazide (MAXZIDE-25) 37.5-25 MG tablet; Take 1 tablet by mouth daily.  Need for diphtheria-tetanus-pertussis (Tdap) vaccine -     Tdap vaccine greater than or equal to 7yo IM  Vitamin D deficiency -     Cancel: Vitamin D 1,25 dihydroxy -     Vitamin D 1,25 dihydroxy; Future  Abnormal vaginal bleeding -     US Pelvic Complete With Transvaginal; Future -     Cancel: CBC w/Diff -      Cancel: Iron and TIBC -     Cancel: TSH -     CBC w/Diff; Future -     Iron and TIBC; Future -     TSH; Future  Morbid obesity with BMI of 50.0-59.9, adult (HCC) -     Cancel: Hemoglobin A1c -     Amb Ref to Medical Weight Management -     Hemoglobin A1c; Future  Flexural eczema -     triamcinolone cream (KENALOG) 0.5 %; Apply 1 application topically 2 (two) times daily.   I have changed Randa Evens triamterene-hydrochlorothiazide. I am also having her maintain her bromocriptine, Vitamin D3, Fish Oil Concentrate, Multiple Vitamins-Minerals (MULTIVITAMIN ADULT PO), MELATONIN PO, levETIRAcetam, lisinopril, and triamcinolone cream.  Meds ordered this encounter  Medications  . triamterene-hydrochlorothiazide (MAXZIDE-25) 37.5-25 MG tablet    Sig: Take 1 tablet by mouth daily.    Dispense:  45 tablet    Refill:  1    Order Specific Question:   Supervising Provider    Answer:   Lucille Passy [[id number]]  . triamcinolone cream (KENALOG) 0.5 %    Sig: Apply 1 application topically 2 (two) times daily.    Dispense:  454 g    Refill:  1    Order Specific Question:   Supervising Provider    Answer:   Lucille Passy [[id number]]    Problem List Items Addressed This Visit      Cardiovascular and Mediastinum   Hypertension - Primary   Relevant Medications   triamterene-hydrochlorothiazide (MAXZIDE-25) 37.5-25 MG tablet     Other   Morbid obesity with BMI of 50.0-59.9, adult ([facility])   Relevant Orders   Amb Ref to Medical Weight Management   Hemoglobin A1c   Vitamin D deficiency   Relevant Orders   Vitamin D 1,25 dihydroxy    Other Visit Diagnoses    Need for diphtheria-tetanus-pertussis (Tdap) vaccine       Relevant Orders   Tdap vaccine greater than or equal to 7yo IM (Completed)   Abnormal vaginal bleeding       Relevant Orders   US Pelvic Complete With Transvaginal   CBC w/Diff   Iron and TIBC   TSH   Flexural eczema       Relevant Medications   triamcinolone cream (KENALOG)  0.5 %       Follow-up: Return in about 3 months (around 12/23/2018) for  HTN and , hyperlipidemia.  Wilfred Lacy, NP

## 2018-09-25 ENCOUNTER — Encounter: Payer: Self-pay | Admitting: Nurse Practitioner

## 2018-10-06 ENCOUNTER — Other Ambulatory Visit: Payer: Federal, State, Local not specified - PPO

## 2018-10-11 ENCOUNTER — Other Ambulatory Visit: Payer: Self-pay | Admitting: Nurse Practitioner

## 2018-10-11 DIAGNOSIS — N939 Abnormal uterine and vaginal bleeding, unspecified: Secondary | ICD-10-CM | POA: Diagnosis not present

## 2018-10-12 LAB — HEMOGLOBIN A1C
Est. average glucose Bld gHb Est-mCnc: 137 mg/dL
Hgb A1c MFr Bld: 6.4 % — ABNORMAL HIGH (ref 4.8–5.6)

## 2018-10-12 LAB — CBC WITH DIFFERENTIAL/PLATELET
Basophils Absolute: 0 10*3/uL (ref 0.0–0.2)
Basos: 0 %
EOS (ABSOLUTE): 0.1 10*3/uL (ref 0.0–0.4)
Eos: 1 %
Hematocrit: 39 % (ref 34.0–46.6)
Hemoglobin: 12.8 g/dL (ref 11.1–15.9)
Immature Grans (Abs): 0 10*3/uL (ref 0.0–0.1)
Immature Granulocytes: 0 %
Lymphocytes Absolute: 1.5 10*3/uL (ref 0.7–3.1)
Lymphs: 12 %
MCH: 28.5 pg (ref 26.6–33.0)
MCHC: 32.8 g/dL (ref 31.5–35.7)
MCV: 87 fL (ref 79–97)
Monocytes Absolute: 0.6 10*3/uL (ref 0.1–0.9)
Monocytes: 5 %
Neutrophils Absolute: 10.8 10*3/uL — ABNORMAL HIGH (ref 1.4–7.0)
Neutrophils: 82 %
Platelets: 370 10*3/uL (ref 150–450)
RBC: 4.49 x10E6/uL (ref 3.77–5.28)
RDW: 14 % (ref 11.7–15.4)
WBC: 13.1 10*3/uL — ABNORMAL HIGH (ref 3.4–10.8)

## 2018-10-12 LAB — IRON AND TIBC
Iron Saturation: 23 % (ref 15–55)
Iron: 82 ug/dL (ref 27–159)
Total Iron Binding Capacity: 359 ug/dL (ref 250–450)
UIBC: 277 ug/dL (ref 131–425)

## 2018-10-12 LAB — TSH: TSH: 1.23 u[IU]/mL (ref 0.450–4.500)

## 2018-10-12 LAB — VITAMIN D 25 HYDROXY (VIT D DEFICIENCY, FRACTURES): Vit D, 25-Hydroxy: 49.1 ng/mL (ref 30.0–100.0)

## 2018-10-20 ENCOUNTER — Ambulatory Visit
Admission: RE | Admit: 2018-10-20 | Discharge: 2018-10-20 | Disposition: A | Discharge status: Home / Self Care | Payer: Federal, State, Local not specified - PPO | Source: Ambulatory Visit | Attending: Nurse Practitioner | Admitting: Nurse Practitioner

## 2018-10-20 DIAGNOSIS — D259 Leiomyoma of uterus, unspecified: Secondary | ICD-10-CM | POA: Diagnosis not present

## 2018-10-20 DIAGNOSIS — N939 Abnormal uterine and vaginal bleeding, unspecified: Secondary | ICD-10-CM

## 2018-10-23 ENCOUNTER — Encounter: Payer: Self-pay | Admitting: Nurse Practitioner

## 2018-10-23 ENCOUNTER — Other Ambulatory Visit: Payer: Self-pay | Admitting: Nurse Practitioner

## 2018-10-23 DIAGNOSIS — N950 Postmenopausal bleeding: Secondary | ICD-10-CM

## 2018-11-03 ENCOUNTER — Ambulatory Visit
Admission: RE | Admit: 2018-11-03 | Discharge: 2018-11-03 | Disposition: A | Discharge status: Home / Self Care | Payer: Federal, State, Local not specified - PPO | Source: Ambulatory Visit | Attending: Nurse Practitioner | Admitting: Nurse Practitioner

## 2018-11-03 ENCOUNTER — Other Ambulatory Visit: Payer: Self-pay

## 2018-11-03 DIAGNOSIS — N63 Unspecified lump in unspecified breast: Secondary | ICD-10-CM

## 2018-11-03 DIAGNOSIS — R928 Other abnormal and inconclusive findings on diagnostic imaging of breast: Secondary | ICD-10-CM | POA: Diagnosis not present

## 2018-11-08 ENCOUNTER — Ambulatory Visit: Payer: Self-pay | Admitting: Adult Health

## 2018-11-13 ENCOUNTER — Other Ambulatory Visit: Payer: Self-pay | Admitting: Family Medicine

## 2018-11-13 ENCOUNTER — Other Ambulatory Visit: Payer: Self-pay

## 2018-11-13 ENCOUNTER — Ambulatory Visit: Payer: Federal, State, Local not specified - PPO | Admitting: Family Medicine

## 2018-11-13 ENCOUNTER — Encounter: Payer: Self-pay | Admitting: Family Medicine

## 2018-11-13 VITALS — BP 149/93 | HR 72 | Wt 310.4 lb

## 2018-11-13 DIAGNOSIS — N939 Abnormal uterine and vaginal bleeding, unspecified: Secondary | ICD-10-CM | POA: Diagnosis not present

## 2018-11-13 DIAGNOSIS — B49 Unspecified mycosis: Secondary | ICD-10-CM | POA: Diagnosis not present

## 2018-11-13 NOTE — Patient Instructions (Signed)

## 2018-11-13 NOTE — Assessment & Plan Note (Signed)
Could be related to Prolactinoma, though has been stable on current dose of bromocriptine for years. Also has fibroids. Lining is likely normal in setting of not post-menopausal. Will r/o endometrial hyperplasia or carcinoma with EMB today. After results return will discuss treatment options. She was not very happy about medications or surgical options. Will await results.

## 2018-11-13 NOTE — Progress Notes (Signed)
Subjective:    Patient ID: Sherry [name] is a 53 y.o. female presenting with New Patient (Initial Visit) (Vaginal Bleeding )  on 11/13/2018  HPI: Never had regular cycles. Menarche at age 35. 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 Takes Bromocriptine to keep this in check and then began having regular cycles.  Since spring of 2020, her cycles have changed. Bleeding has not stopped. Decided she was going through menopause. Saw her primary and noted to have slightly thickened endometrium. Denies hot flashes or night sweats.  Review of Systems  Constitutional: Negative for chills and fever.  Respiratory: Negative for shortness of breath.   Cardiovascular: Negative for chest pain.  Gastrointestinal: Negative for abdominal pain, nausea and vomiting.  Genitourinary: Positive for vaginal bleeding. Negative for dysuria.  Skin: Negative for rash.      Objective:    BP (!) 149/93    Pulse 72    Wt (!) 310 lb 6.4 oz (140.8 kg)    LMP 11/13/2018 (LMP Unknown)    BMI 54.98 kg/m  Physical Exam Constitutional:      General: She is not in acute distress.    Appearance: She is well-developed. She is morbidly obese.  HENT:     Head: Normocephalic and atraumatic.  Eyes:     General: No scleral icterus. Neck:     Musculoskeletal: Neck supple.  Cardiovascular:     Rate and Rhythm: Normal rate.  Pulmonary:     Effort: Pulmonary effort is normal.  Abdominal:     Palpations: Abdomen is soft.  Genitourinary:    Comments: BUS normal, vagina is pink and rugated, cervix is nulliparous without lesion, exam limited by body habitus  Skin:    General: Skin is warm and dry.  Neurological:     Mental Status: She is alert and oriented to person, place, and time.    TVUS report  Uterus Measurements: 11.1 x 4.7 x 6.5 cm = volume: 177.6 mL. There is a 2.5 x 2.5 x 2.6 cm fibroid within the anterior left uterine fundus, a 2.2 x 1.7 x 2.0 cm  fibroid within the posterior uterine body and a 1.6 x 1.1 x 1.3 cm fibroid within the posterior right uterine body.  Endometrium Thickness: 9 mm.  No focal abnormality visualized.  Right ovary Measurements: 1.7 x 1.5 x 1.5 cm = volume: 1.7 mL. Normal appearance/no adnexal mass.  Left ovary Not visualized  Other findings No abnormal free fluid.  IMPRESSION: Endometrium measures 9 mm. If bleeding remains unresponsive to hormonal or medical therapy, sonohysterogram should be considered for focal lesion work-up.   Procedure: Patient given informed consent, signed copy in the chart, time out was performed. Appropriate time out taken.  The patient was placed in the lithotomy position and the cervix brought into view with sterile speculum.  A tenaculum was placed in the anterior lip of the cervix.  The uterus was sounded for depth of 10 cm. A pipelle was introduced to into the uterus, suction created, and an endometrial sample was obtained. All equipment was removed and accounted for. The patient tolerated the procedure well.      Assessment & Plan:   Problem List Items Addressed This Visit      Unprioritized   Abnormal uterine bleeding - Primary    Could be related to Prolactinoma, though has been stable on current dose of bromocriptine for years. Also has fibroids. Lining is likely normal in setting of  not post-menopausal. Will r/o endometrial hyperplasia or carcinoma with EMB today. After results return will discuss treatment options. She was not very happy about medications or surgical options. Will await results.      Relevant Orders   Follicle stimulating hormone   Surgical pathology( [place]/ [facility])      Total face-to-face time with patient: 30 minutes. Over 50% of encounter was spent on counseling and coordination of care. Return in about 4 weeks (around 12/11/2018), or if symptoms worsen or fail to improve.  [name] 11/13/2018 3:01 PM

## 2018-11-13 NOTE — Progress Notes (Signed)
Last pap 06/07/2017- normal Last Mammogram -11/03/2018 Patient reports having some abnormal bleeding since last March. Normal cycle last 3-5 days.

## 2018-11-15 DIAGNOSIS — N939 Abnormal uterine and vaginal bleeding, unspecified: Secondary | ICD-10-CM | POA: Diagnosis not present

## 2018-11-16 LAB — FOLLICLE STIMULATING HORMONE: FSH: 13.4 m[IU]/mL

## 2018-11-17 ENCOUNTER — Telehealth: Payer: Self-pay | Admitting: *Deleted

## 2018-11-17 NOTE — Telephone Encounter (Signed)
Pt informed of results and follow up appointment made.  

## 2018-11-17 NOTE — Telephone Encounter (Signed)
-----   Message from [name], MD sent at 11/17/2018  9:02 AM EDT ----- Her pathology was normal.

## 2018-12-07 ENCOUNTER — Other Ambulatory Visit: Payer: Self-pay

## 2018-12-07 ENCOUNTER — Telehealth (INDEPENDENT_AMBULATORY_CARE_PROVIDER_SITE_OTHER): Payer: Federal, State, Local not specified - PPO | Admitting: Family Medicine

## 2018-12-07 DIAGNOSIS — N939 Abnormal uterine and vaginal bleeding, unspecified: Secondary | ICD-10-CM | POA: Diagnosis not present

## 2018-12-07 NOTE — Progress Notes (Signed)
  TELEHEALTH VIRTUAL GYNECOLOGY VISIT ENCOUNTER NOTE  I connected with Sherry [name]  on 12/06/2018 at  1:45 PM EST by telephone at home and verified that I am speaking with the correct person using two identifiers.   I discussed the limitations, risks, security and privacy concerns of performing an evaluation and management service by telephone and the availability of in person appointments. I also discussed with the patient that there may be a patient responsible charge related to this service. The patient expressed understanding and agreed to proceed. Subjective:    Patient ID: Sherry [name] is a 53 y.o. female presenting with Follow-up  on 12/07/2018  HPI: Has not had as much bleeding. And this feels like a regular cycle.did not have any cramps with this last cycle. Has been working on diet and losing weight. Reviewed labs, path and u/s.  Review of Systems  Constitutional: Negative for chills and fever.  Respiratory: Negative for shortness of breath.   Cardiovascular: Negative for chest pain.  Gastrointestinal: Negative for abdominal pain, nausea and vomiting.  Genitourinary: Negative for dysuria.  Skin: Negative for rash.      Objective:    LMP 11/13/2018 (LMP Unknown)  Physical Exam  General:  Alert, oriented and cooperative.   Mental Status: Normal mood and affect perceived. Normal judgment and thought content.  Physical exam deferred due to nature of the encounter    Assessment & Plan:   Problem List Items Addressed This Visit      Unprioritized   Abnormal uterine bleeding    Working out more and now is losing weight and some improvement with her bleeding pattern. She thinks this will continue to help her bleeding. She did not want meds or surgeries, so we will watch and see how her cycles are.        I discussed the assessment and treatment plan with the patient. The patient was provided an opportunity to ask questions and all were answered. The patient agreed  with the plan and demonstrated an understanding of the instructions.   The patient was advised to call back or seek an in-person evaluation/go to the ED if the symptoms worsen or if the condition fails to improve as anticipated.  I provided 11 minutes of non-face-to-face time during this encounter.  Return in about 3 months (around 03/09/2019) for virtual.  Sherry [name] 12/07/2018 2:00 PM

## 2018-12-07 NOTE — Assessment & Plan Note (Signed)
Working out more and now is losing weight and some improvement with her bleeding pattern. She thinks this will continue to help her bleeding. She did not want meds or surgeries, so we will watch and see how her cycles are.

## 2018-12-14 ENCOUNTER — Telehealth: Payer: Self-pay | Admitting: Nurse Practitioner

## 2018-12-14 NOTE — Telephone Encounter (Signed)

## 2018-12-15 ENCOUNTER — Other Ambulatory Visit: Payer: Self-pay

## 2018-12-15 ENCOUNTER — Encounter: Payer: Self-pay | Admitting: Nurse Practitioner

## 2018-12-15 ENCOUNTER — Ambulatory Visit: Payer: Federal, State, Local not specified - PPO | Admitting: Nurse Practitioner

## 2018-12-15 VITALS — BP 142/90 | HR 66 | Temp 98.1°F | Ht 63.0 in | Wt 305.0 lb

## 2018-12-15 DIAGNOSIS — Z13220 Encounter for screening for lipoid disorders: Secondary | ICD-10-CM | POA: Diagnosis not present

## 2018-12-15 DIAGNOSIS — Z136 Encounter for screening for cardiovascular disorders: Secondary | ICD-10-CM

## 2018-12-15 DIAGNOSIS — N939 Abnormal uterine and vaginal bleeding, unspecified: Secondary | ICD-10-CM | POA: Diagnosis not present

## 2018-12-15 DIAGNOSIS — I10 Essential (primary) hypertension: Secondary | ICD-10-CM

## 2018-12-15 DIAGNOSIS — I152 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: Secondary | ICD-10-CM | POA: Diagnosis not present

## 2018-12-15 NOTE — Patient Instructions (Addendum)
Go to [facility] for blood draw Increase lisinopril to 10mg  daily Schedule appt for removal of skin lesion

## 2018-12-15 NOTE — Progress Notes (Signed)
Subjective:  Patient ID: Sherry [name], female    DOB: 1965-06-17  Age: 53 y.o. MRN: [id number]  CC: Follow-up (BP and lipid follow up/spot on bottox on left side,painful/)  HPI HTN: Uncontrolled lisinopril and maxzide Reports compliance with DASH, CPAP machine and medications Has persistent LE edema. No exercise regimen. 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 No chest pain, no headache, no dizziness BP Readings from Last 3 Encounters:  12/15/18 (!) 142/90  11/13/18 (!) 149/93  09/22/18 (!) 142/86   Reviewed past Medical, Social and Family history today.  Outpatient Medications Prior to Visit  Medication Sig Dispense Refill  . bromocriptine (PARLODEL) 2.5 MG tablet TAKE ONE (1) TABLET BY MOUTH ONCE DAILY    . Cholecalciferol (VITAMIN D3) 5000 units TABS Take by mouth.    . Multiple Vitamins-Minerals (MULTIVITAMIN ADULT PO) Take by mouth.    . Omega-3 Fatty Acids (FISH OIL CONCENTRATE) 300 MG CAPS Take 500 mg by mouth.    . triamcinolone cream (KENALOG) 0.5 % Apply 1 application topically 2 (two) times daily. 454 g 1  . triamterene-hydrochlorothiazide (MAXZIDE-25) 37.5-25 MG tablet Take 1 tablet by mouth daily. 45 tablet 1  . lisinopril (ZESTRIL) 5 MG tablet Take 1 tablet (5 mg total) by mouth daily. 90 tablet 1  . MELATONIN PO Take by mouth. Help with sleep PRN     No facility-administered medications prior to visit.     ROS See HPI  Objective:  BP (!) 142/90   Pulse 66   Temp 98.1 F (36.7 C) (Tympanic)   Ht 5\' 3"  (1.6 m)   Wt (!) 305 lb (138.3 kg)   SpO2 98%   BMI 54.03 kg/m   BP Readings from Last 3 Encounters:  12/15/18 (!) 142/90  11/13/18 (!) 149/93  09/22/18 (!) 142/86    Wt Readings from Last 3 Encounters:  12/15/18 (!) 305 lb (138.3 kg)  11/13/18 (!) 310 lb 6.4 oz (140.8 kg)  09/22/18 (!) 318 lb (144.2 kg)    Physical Exam Vitals signs reviewed.  Cardiovascular:     Rate and Rhythm: Normal rate and regular rhythm.  Pulmonary:     Effort: Pulmonary  effort is normal.     Breath sounds: Normal breath sounds.  Musculoskeletal:     Right lower leg: Edema present.     Left lower leg: Edema present.  Neurological:     Mental Status: She is alert and oriented to person, place, and time.    Lab Results  Component Value Date   WBC 13.1 (H) 10/11/2018   HGB 12.8 10/11/2018   HCT 39.0 10/11/2018   PLT 370 10/11/2018   GLUCOSE 95 06/15/2018   CHOL 182 06/07/2017   TRIG 83.0 06/07/2017   HDL 54.20 06/07/2017   LDLCALC 111 (H) 06/07/2017   ALT 12 06/07/2017   AST 15 06/07/2017   NA 140 06/15/2018   K 4.3 06/15/2018   CL 103 06/15/2018   CREATININE 0.79 06/15/2018   BUN 12 06/15/2018   CO2 21 06/15/2018   TSH 1.230 10/11/2018   HGBA1C 6.4 (H) 10/11/2018    Mm Diag Breast Tomo Bilateral  Result Date: 11/03/2018 CLINICAL DATA:  Patient had recent mammogram and ultrasound of the right breast in August 2020 to follow right breast masses. Patient is here her yearly mammogram. EXAM: DIGITAL DIAGNOSTIC BILATERAL MAMMOGRAM WITH CAD AND TOMO COMPARISON:  Previous exam(s). ACR Breast Density Category b: There are scattered areas of fibroglandular density. FINDINGS: Cc and MLO views of bilateral breasts  are submitted. Stable asymmetries are identified in the retroareolar right breast unchanged. No suspicious abnormalities identified in the left breast. Mammographic images were processed with CAD. IMPRESSION: Probable benign findings. RECOMMENDATION: Twelve month follow-up bilateral diagnostic mammogram and right breast ultrasound. I have discussed the findings and recommendations with the patient. If applicable, a reminder letter will be sent to the patient regarding the next appointment. BI-RADS CATEGORY  3: Probably benign. Electronically Signed   By: Abelardo Diesel M.D.   On: 11/03/2018 13:35    Assessment & Plan:   Trinisha was seen today for follow-up.  Diagnoses and all orders for this visit:  Hypertension due to endocrine disorder -      HTN_1 BMP; Future  Abnormal vaginal bleeding -     CBC; Future  Encounter for lipid screening for cardiovascular disease -     HTN_4 Lipid panel; Future  Essential hypertension -     lisinopril (ZESTRIL) 10 MG tablet; Take 1 tablet (10 mg total) by mouth daily.   I have changed Randa Evens lisinopril. I am also having her maintain her bromocriptine, Vitamin D3, Fish Oil Concentrate, Multiple Vitamins-Minerals (MULTIVITAMIN ADULT PO), MELATONIN PO, triamterene-hydrochlorothiazide, and triamcinolone cream.  Meds ordered this encounter  Medications  . lisinopril (ZESTRIL) 10 MG tablet    Sig: Take 1 tablet (10 mg total) by mouth daily.    Dispense:  90 tablet    Refill:  1    Order Specific Question:   Supervising Provider    Answer:   Lucille Passy [[id number]]    Problem List Items Addressed This Visit      Cardiovascular and Mediastinum   Hypertension - Primary    Uncontrolled lisinopril and maxzide Reports compliance with DASH, CPAP machine and medications Has persistent LE edema. No exercise regimen. 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 No chest pain, no headache, no dizziness  Increase lisinopril to 10mg  daily [facility] for repeat BMP. F/up in 99months      Relevant Medications   lisinopril (ZESTRIL) 10 MG tablet   Other Relevant Orders   HTN_1 BMP    Other Visit Diagnoses    Abnormal vaginal bleeding       Relevant Orders   CBC   Encounter for lipid screening for cardiovascular disease       Relevant Orders   HTN_4 Lipid panel      Follow-up: Return in about 3 months (around 03/17/2019) for HTN.  Wilfred Lacy, NP

## 2018-12-18 ENCOUNTER — Encounter: Payer: Self-pay | Admitting: Nurse Practitioner

## 2018-12-18 MED ORDER — LISINOPRIL 10 MG PO TABS: 10.0000 mg | ORAL_TABLET | Freq: Every day | ORAL | 1 refills | Status: DC

## 2018-12-18 NOTE — Assessment & Plan Note (Addendum)
Uncontrolled lisinopril and maxzide Reports compliance with DASH, CPAP machine and medications Has persistent LE edema. No exercise regimen. 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 No chest pain, no headache, no dizziness  Increase lisinopril to 10mg  daily Go [facility] for repeat BMP. F/up in 53months

## 2018-12-20 ENCOUNTER — Other Ambulatory Visit: Payer: Self-pay | Admitting: Nurse Practitioner

## 2018-12-20 DIAGNOSIS — Z136 Encounter for screening for cardiovascular disorders: Secondary | ICD-10-CM | POA: Diagnosis not present

## 2018-12-20 DIAGNOSIS — I152 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: Secondary | ICD-10-CM | POA: Diagnosis not present

## 2018-12-21 ENCOUNTER — Telehealth: Payer: Self-pay | Admitting: Nurse Practitioner

## 2018-12-21 LAB — LIPID PANEL
Chol/HDL Ratio: 3.2 ratio (ref 0.0–4.4)
Cholesterol, Total: 178 mg/dL (ref 100–199)
HDL: 55 mg/dL (ref >39)
LDL Chol Calc (NIH): 107 mg/dL — ABNORMAL HIGH (ref 0–99)
Triglycerides: 85 mg/dL (ref 0–149)
VLDL Cholesterol Cal: 16 mg/dL (ref 5–40)

## 2018-12-21 LAB — BASIC METABOLIC PANEL
BUN/Creatinine Ratio: 11 (ref 9–23)
BUN: 9 mg/dL (ref 6–24)
CO2: 25 mmol/L (ref 20–29)
Calcium: 9.8 mg/dL (ref 8.7–10.2)
Chloride: 102 mmol/L (ref 96–106)
Creatinine, Ser: 0.82 mg/dL (ref 0.57–1.00)
GFR calc Af Amer: 94 mL/min/{1.73_m2} (ref >59)
GFR calc non Af Amer: 82 mL/min/{1.73_m2} (ref >59)
Glucose: 81 mg/dL (ref 65–99)
Potassium: 4.2 mmol/L (ref 3.5–5.2)
Sodium: 142 mmol/L (ref 134–144)

## 2018-12-21 NOTE — Telephone Encounter (Signed)

## 2018-12-22 ENCOUNTER — Encounter: Payer: Self-pay | Admitting: Nurse Practitioner

## 2018-12-22 ENCOUNTER — Other Ambulatory Visit: Payer: Self-pay

## 2018-12-22 ENCOUNTER — Ambulatory Visit: Payer: Federal, State, Local not specified - PPO | Admitting: Nurse Practitioner

## 2018-12-22 VITALS — BP 122/74 | HR 90 | Temp 97.8°F | Ht 63.0 in | Wt 308.6 lb

## 2018-12-22 DIAGNOSIS — L918 Other hypertrophic disorders of the skin: Secondary | ICD-10-CM | POA: Diagnosis not present

## 2018-12-22 NOTE — Progress Notes (Signed)
Subjective:  Patient ID: Sherry [name], female    DOB: [birth year]  Age: 53 y.o. MRN: [id number]  CC: Follow-up (skin lesion removal/)  Rash This is a new problem. The current episode started more than 1 month ago. The problem is unchanged. The affected locations include the left buttock. The rash is characterized by pain. It is unknown if there was an exposure to a precipitant. Pertinent negatives include no fatigue, fever or joint pain. Past treatments include nothing.   Reviewed past Medical, Social and Family history today.  Outpatient Medications Prior to Visit  Medication Sig Dispense Refill  . bromocriptine (PARLODEL) 2.5 MG tablet TAKE ONE (1) TABLET BY MOUTH ONCE DAILY    . Cholecalciferol (VITAMIN D3) 5000 units TABS Take by mouth.    [name] lisinopril (ZESTRIL) 10 MG tablet Take 1 tablet (10 mg total) by mouth daily. 90 tablet 1  . MELATONIN PO Take by mouth. Help with sleep PRN    . Multiple Vitamins-Minerals (MULTIVITAMIN ADULT PO) Take by mouth.    . Omega-3 Fatty Acids (FISH OIL CONCENTRATE) 300 MG CAPS Take 500 mg by mouth.    . triamcinolone cream (KENALOG) 0.5 % Apply 1 application topically 2 (two) times daily. 454 g 1  . triamterene-hydrochlorothiazide (MAXZIDE-25) 37.5-25 MG tablet Take 1 tablet by mouth daily. 45 tablet 1   No facility-administered medications prior to visit.     ROS See HPI  Objective:  BP 122/74   Pulse 90   Temp 97.8 F (36.6 C) (Tympanic)   Ht 5\' 3"  (1.6 m)   Wt (!) 308 lb 9.6 oz (140 kg)   SpO2 98%   BMI 54.67 kg/m   BP Readings from Last 3 Encounters:  12/22/18 122/74  12/15/18 (!) 142/90  11/13/18 (!) 149/93    Wt Readings from Last 3 Encounters:  12/22/18 (!) 308 lb 9.6 oz (140 kg)  12/15/18 (!) 305 lb (138.3 kg)  11/13/18 (!) 310 lb 6.4 oz (140.8 kg)   Physical Exam Constitutional:      Appearance: She is obese.  Skin:    Findings: Lesion present. No erythema.       Neurological:     Mental Status: She is alert and  oriented to person, place, and time.    Lab Results  Component Value Date   WBC 13.1 (H) 10/11/2018   HGB 12.8 10/11/2018   HCT 39.0 10/11/2018   PLT 370 10/11/2018   GLUCOSE 95 06/15/2018   CHOL 182 06/07/2017   TRIG 83.0 06/07/2017   HDL 54.20 06/07/2017   LDLCALC 111 (H) 06/07/2017   ALT 12 06/07/2017   AST 15 06/07/2017   NA 140 06/15/2018   K 4.3 06/15/2018   CL 103 06/15/2018   CREATININE 0.79 06/15/2018   BUN 12 06/15/2018   CO2 21 06/15/2018   TSH 1.230 10/11/2018   HGBA1C 6.4 (H) 10/11/2018   Procedure including risks/benefits explained to patient.   Questions were answered.  After informed consent was obtained and a time out completed. Site was cleansed with betadine and then alcohol. 1% Lidocaine with epinephrine 67ml was injected under lesion and then shave biopsy was performed. Obtain hemostasis by applying pressure. No need to suture placement.  Pt tolerated procedure well.  Specimen sent for pathology review.   Pt instructed to keep the area dry for 24 hours and to contact us if he develops redness, drainage or swelling at the site.  Pt may use tylenol as needed for discomfort today.  Assessment & Plan:   Sherry [name] was seen today for follow-up.  Diagnoses and all orders for this visit:  Skin tag -     Cancel: Surgical pathology -     Pathology Report ([facility])   I am having Sherry [name] maintain her bromocriptine, Vitamin D3, Fish Oil Concentrate, Multiple Vitamins-Minerals (MULTIVITAMIN ADULT PO), MELATONIN PO, triamterene-hydrochlorothiazide, triamcinolone cream, and lisinopril.  No orders of the defined types were placed in this encounter.   Problem List Items Addressed This Visit    None    Visit Diagnoses    Skin tag    -  Primary   Relevant Orders   Pathology Report ([facility])      Follow-up: Return if symptoms worsen or fail to improve.  Wilfred Lacy, NP

## 2018-12-22 NOTE — Patient Instructions (Addendum)
Leave bandaid on for 24hrs Clean with soap and water.  Skin Biopsy, Care After This sheet gives you information about how to care for yourself after your procedure. Your health care provider may also give you more specific instructions. If you have problems or questions, contact your health care provider. What can I expect after the procedure? After the procedure, it is common to have:  Soreness.  Bruising.  Itching. Follow these instructions at home: Biopsy site care Follow instructions from your health care provider about how to take care of your biopsy site. Make sure you:  Wash your hands with soap and water before and after you change your bandage (dressing). If soap and water are not available, use hand sanitizer.  Apply ointment on your biopsy site as directed by your health care provider.  Change your dressing as told by your health care provider.  Leave stitches (sutures), skin glue, or adhesive strips in place. These skin closures may need to stay in place for 2 weeks or longer. If adhesive strip edges start to loosen and curl up, you may trim the loose edges. Do not remove adhesive strips completely unless your health care provider tells you to do that.  If the biopsy area bleeds, apply gentle pressure for 10 minutes. Check your biopsy site every day for signs of infection. Check for:  Redness, swelling, or pain.  Fluid or blood.  Warmth.  Pus or a bad smell.  General instructions  Rest and then return to your normal activities as told by your health care provider.  Take over-the-counter and prescription medicines only as told by your health care provider.  Keep all follow-up visits as told by your health care provider. This is important. Contact a health care provider if:  You have redness, swelling, or pain around your biopsy site.  You have fluid or blood coming from your biopsy site.  Your biopsy site feels warm to the touch.  You have pus or a bad  smell coming from your biopsy site.  You have a fever.  Your sutures, skin glue, or adhesive strips loosen or come off sooner than expected. Get help right away if:  You have bleeding that does not stop with pressure or a dressing. Summary  After the procedure, it is common to have soreness, bruising, and itching at the site.  Follow instructions from your health care provider about how to take care of your biopsy site.  Check your biopsy site every day for signs of infection.  Contact a health care provider if you have redness, swelling, or pain around your biopsy site, or your biopsy site feels warm to the touch.  Keep all follow-up visits as told by your health care provider. This is important. This information is not intended to replace advice given to you by your health care provider. Make sure you discuss any questions you have with your health care provider. Document Released: 02/14/2015 Document Revised: 07/18/2017 Document Reviewed: 07/18/2017 Elsevier Patient Education  2020 [employer].

## 2018-12-23 ENCOUNTER — Encounter: Payer: Self-pay | Admitting: Nurse Practitioner

## 2018-12-26 DIAGNOSIS — D352 Benign neoplasm of pituitary gland: Secondary | ICD-10-CM | POA: Diagnosis not present

## 2018-12-26 DIAGNOSIS — D353 Benign neoplasm of craniopharyngeal duct: Secondary | ICD-10-CM | POA: Diagnosis not present

## 2018-12-26 LAB — PATHOLOGY REPORT

## 2018-12-26 LAB — TISSUE SPECIMEN

## 2018-12-27 ENCOUNTER — Ambulatory Visit: Payer: Self-pay

## 2018-12-27 ENCOUNTER — Ambulatory Visit: Payer: Federal, State, Local not specified - PPO | Admitting: Family Medicine

## 2018-12-27 DIAGNOSIS — D353 Benign neoplasm of craniopharyngeal duct: Secondary | ICD-10-CM | POA: Diagnosis not present

## 2018-12-27 DIAGNOSIS — I10 Essential (primary) hypertension: Secondary | ICD-10-CM | POA: Diagnosis not present

## 2018-12-27 DIAGNOSIS — E221 Hyperprolactinemia: Secondary | ICD-10-CM | POA: Diagnosis not present

## 2018-12-27 DIAGNOSIS — D352 Benign neoplasm of pituitary gland: Secondary | ICD-10-CM | POA: Diagnosis not present

## 2018-12-27 NOTE — Telephone Encounter (Signed)
Provided lab results and pathological results of skin tag.  Voiced understanding.

## 2019-01-10 ENCOUNTER — Encounter: Payer: Self-pay | Admitting: Radiology

## 2019-02-20 ENCOUNTER — Ambulatory Visit (INDEPENDENT_AMBULATORY_CARE_PROVIDER_SITE_OTHER): Payer: Federal, State, Local not specified - PPO | Admitting: Family Medicine

## 2019-02-20 ENCOUNTER — Other Ambulatory Visit: Payer: Self-pay

## 2019-02-20 ENCOUNTER — Encounter: Payer: Self-pay | Admitting: Family Medicine

## 2019-02-20 ENCOUNTER — Other Ambulatory Visit: Payer: Self-pay | Admitting: Nurse Practitioner

## 2019-02-20 DIAGNOSIS — D352 Benign neoplasm of pituitary gland: Secondary | ICD-10-CM | POA: Diagnosis not present

## 2019-02-20 DIAGNOSIS — R600 Localized edema: Secondary | ICD-10-CM

## 2019-02-20 DIAGNOSIS — D353 Benign neoplasm of craniopharyngeal duct: Secondary | ICD-10-CM | POA: Diagnosis not present

## 2019-02-20 DIAGNOSIS — I10 Essential (primary) hypertension: Secondary | ICD-10-CM

## 2019-02-20 DIAGNOSIS — G4733 Obstructive sleep apnea (adult) (pediatric): Secondary | ICD-10-CM

## 2019-02-20 NOTE — Progress Notes (Signed)
PATIENT: Sherry [name] DOB: 01-31-1966  REASON FOR VISIT: follow up HISTORY FROM: patient  Virtual Visit via Telephone Note  I connected with [name] on 02/20/19 at  2:30 PM EST by telephone and verified that I am speaking with the correct person using two identifiers.   I discussed the limitations, risks, security and privacy concerns of performing an evaluation and management service by telephone and the availability of in person appointments. I also discussed with the patient that there may be a patient responsible charge related to this service. The patient expressed understanding and agreed to proceed.   History of Present Illness:  02/20/19 Sherry [name] is a 54 y.o. female here today for follow up.  She is doing very well on CPAP therapy.  She reports that she cannot sleep without her CPAP machine.  She notes significant improvements in sleep quality as well as increased energy.  She is working on lifestyle changes.  She has recently started monitoring her intake of simple sugars.  She has lost at least 5 to 10 pounds.  She is feeling much more energized.  She feels that mental clarity is sharper.  She denies any seizure like activity.  She is followed by endocrinology.  Compliance report dated 01/20/2019 through 02/18/2019 reveals that she use CPAP every night for compliance of 100%.  Every night she used CPAP greater than 4 hours for compliance of 100%.  Average usage was 7 hours and 56 minutes.  Residual AHI was 1.3 on 5 to 13 cm of water and EPR of 3.  There was no significant leak noted.  BP 133/85 WT 300lbs.   History (copied from [place] note on 05/03/2018)  Sherry [name] is a 54 y.o. female with recent diagnosis of OSA and CPAP initiation. She was initially scheduled for face-to-face office visit today at this time for initial CPAP compliance visit but due to [facility] office visit rescheduled for non-face-to-face telephone visit.   CPAP  compliance report from 04/03/2018 -05/02/2018 shows 30 out of 30 usage days with 30 days greater than 4 hours for 100% compliance.  Average usage 9 hours and 1 minute with residual AHI 1.9.  Leaks in the 95th percentile 17.7.  Pressure in the 95th percentile 11.8 with minimum pressure 5 cm H2O and max pressure 13 cm H2O with a EPR 3.  She reports doing exceptionally well on CPAP with overall improvement of her energy level and sleep quality at night.  She denies any additional symptoms of vertigo or dizziness as she was having these symptoms prior to initiating CPAP.  She feels as though she is more energized during the day and is planning on getting into an exercise/workout routine as she had difficulty prior due to lack of energy.  She does have concerns regarding her current DME company [employer] as she has been experiencing difficulties reaching them and will have to make multiple phone calls.    Epworth Sleepiness Scale: 5/24 (today's visit) Epworth Sleepiness Scale: 11/24 (12/21/2017)  Of note, during titration study EEG abnormalities identified and discussion with Dr. Brett Fairy regarding potentially initiating AED but decided on using CPAP first as she does have a history of pituitary adenoma.  She states she followed up with her endocrinologist who recommended not initiating AED unless repeat sleep study or EEG obtained to assess for additional seizure activity or abnormalities.   Observations/Objective:  Unable to assess due to televisit    Assessment and Plan:  54 y.o. year old female  has  a past medical history of Brain tumor (benign) ([place]) (1996), Chickenpox, and OSA (obstructive sleep apnea). here with    ICD-10-CM   1. OSA (obstructive sleep apnea)  G47.33   2. Benign neoplasm of pituitary gland and craniopharyngeal duct ([place])  D35.2    D35.3     [name] is doing very well on CPAP therapy.  Compliance report reveals excellent compliance.  She has noted significant improvement  in how she is feeling.  She will continue CPAP usage nightly and for greater than 4 hours each night.  She was encouraged to continue working on healthy lifestyle changes with well-balanced diet and regular exercise.  She will follow-up with [place] in 1 year, sooner if needed.  She verbalizes understanding and agreement with this plan.  No orders of the defined types were placed in this encounter.   No orders of the defined types were placed in this encounter.    Follow Up Instructions:  I discussed the assessment and treatment plan with the patient. The patient was provided an opportunity to ask questions and all were answered. The patient agreed with the plan and demonstrated an understanding of the instructions.   The patient was advised to call back or seek an in-person evaluation if the symptoms worsen or if the condition fails to improve as anticipated.  I provided 15 minutes of non-face-to-face time during this encounter.  Patient is unable to be seen in our office due to concerns of Covid pandemic.  She was unable to use the video technology for my chart visit.  Televisit completed.  Patient was at her place of residence during visit.  Provider in the office.   Debbora Presto, NP

## 2019-03-09 ENCOUNTER — Ambulatory Visit (INDEPENDENT_AMBULATORY_CARE_PROVIDER_SITE_OTHER): Payer: Federal, State, Local not specified - PPO | Admitting: Nurse Practitioner

## 2019-03-09 ENCOUNTER — Encounter: Payer: Self-pay | Admitting: Nurse Practitioner

## 2019-03-09 ENCOUNTER — Other Ambulatory Visit: Payer: Self-pay

## 2019-03-09 VITALS — BP 137/81 | Ht 63.0 in | Wt 298.4 lb

## 2019-03-09 DIAGNOSIS — I152 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: Secondary | ICD-10-CM | POA: Diagnosis not present

## 2019-03-09 DIAGNOSIS — R600 Localized edema: Secondary | ICD-10-CM

## 2019-03-09 MED ORDER — TRIAMTERENE-HCTZ 37.5-25 MG PO TABS: 1.0000 | ORAL_TABLET | Freq: Every day | ORAL | 0 refills | Status: DC

## 2019-03-09 MED ORDER — LISINOPRIL 10 MG PO TABS: 10.0000 mg | ORAL_TABLET | Freq: Every day | ORAL | 3 refills | Status: DC

## 2019-03-09 NOTE — Progress Notes (Signed)
For [employer] and video telecommunications were attempted between this provider and patient, however failed, due to patient having technical difficulties OR patient did not have access to video capability.  We continued and completed visit with audio only.   Virtual Visit via Video Note  I connected with@ on 03/09/19 at  1:30 PM EST by a telephone enabled telemedicine application and verified that I am speaking with the correct person using two identifiers.  Location: Patient:Home Provider: Office Participants: patient and provider  I discussed the limitations of evaluation and management by telemedicine and the availability of in person appointments. I also discussed with the patient that there may be a patient responsible charge related to this service. The patient expressed understanding and agreed to proceed.  CC:HTN and edema f/up  History of Present Illness:  HTN: BP at goal with lisinopril and maxzide. Compliant with CPAP use Reports persistent LE edema. No SOB, or CP or dizziness or PND, no erythema, no leg pain. BP Readings from Last 3 Encounters:  03/09/19 137/81  12/22/18 122/74  12/15/18 (!) 142/90   Wt Readings from Last 3 Encounters:  03/09/19 298 lb 6.4 oz (135.4 kg)  12/22/18 (!) 308 lb 9.6 oz (140 kg)  12/15/18 (!) 305 lb (138.3 kg)    Observations/Objective: Alert and oriented. Normal speech Limited exam due to telephone encounter  Assessment and Plan: Alyne was seen today for follow-up.  Diagnoses and all orders for this visit:  Hypertension due to endocrine disorder -     lisinopril (ZESTRIL) 10 MG tablet; Take 1 tablet (10 mg total) by mouth daily. -     triamterene-hydrochlorothiazide (MAXZIDE-25) 37.5-25 MG tablet; Take 1 tablet by mouth daily.  Bilateral leg edema -     triamterene-hydrochlorothiazide (MAXZIDE-25) 37.5-25 MG tablet; Take 1 tablet by mouth daily.   Follow Up Instructions: See avs   I discussed  the assessment and treatment plan with the patient. The patient was provided an opportunity to ask questions and all were answered. The patient agreed with the plan and demonstrated an understanding of the instructions.   The patient was advised to call back or seek an in-person evaluation if the symptoms worsen or if the condition fails to improve as anticipated.  I spent 41mins with Ms. Hersman discussing HTN and LE edema management. and  Wilfred Lacy, NP

## 2019-03-09 NOTE — Patient Instructions (Addendum)
Maintain current medication. Start daily exercise (walking 15-30mins a day)

## 2019-03-11 ENCOUNTER — Encounter: Payer: Self-pay | Admitting: Nurse Practitioner

## 2019-03-18 IMAGING — US ULTRASOUND RIGHT BREAST LIMITED
1 series · 13 of 21 positions shown · non-contrast
Comparison: Previous exam(s).

CLINICAL DATA: Screening recall from baseline for a possible right
breast masses.

EXAM:
DIGITAL DIAGNOSTIC RIGHT MAMMOGRAM WITH CAD AND TOMO
ULTRASOUND RIGHT BREAST

[Series 1: ultrasound right breast limited · 0.06mm/px · 13 of 21 slices shown]
[im 1/21]
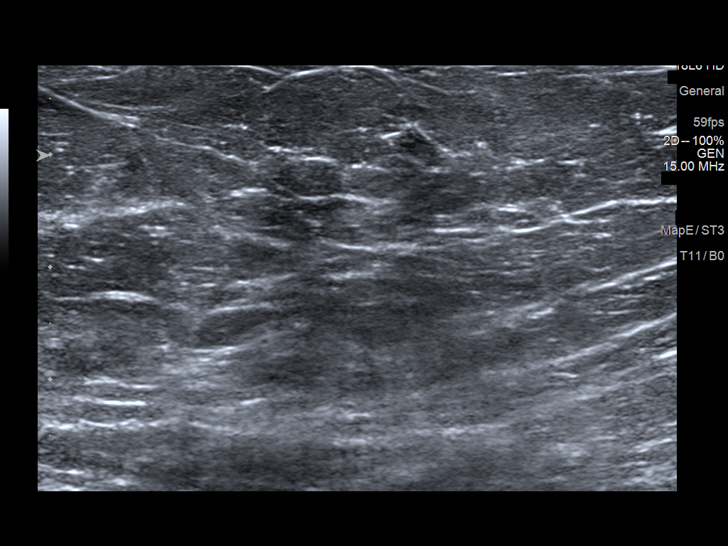
[im 3/21]
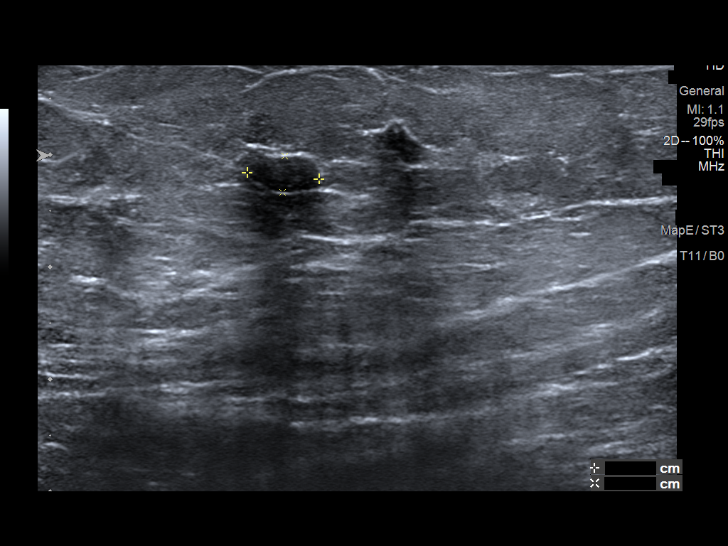
[im 5/21]
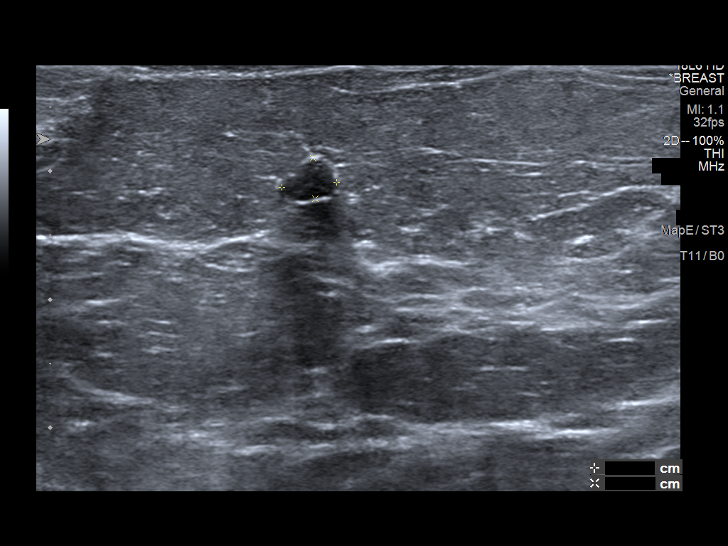
[im 6/21]
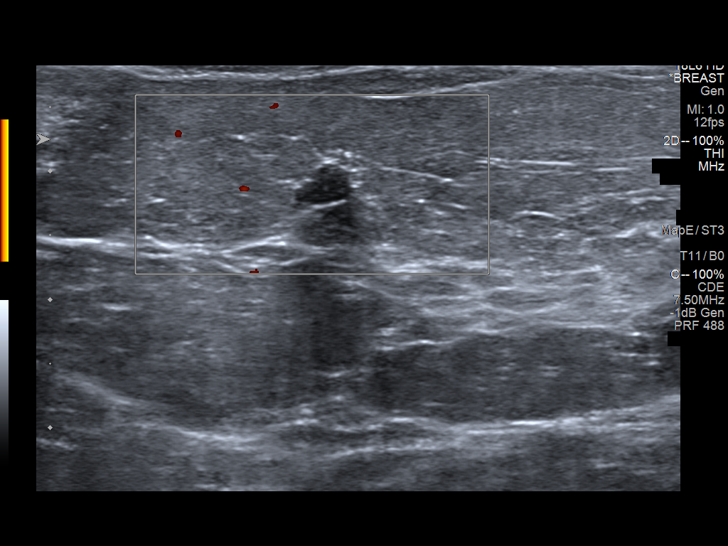
[im 8/21]
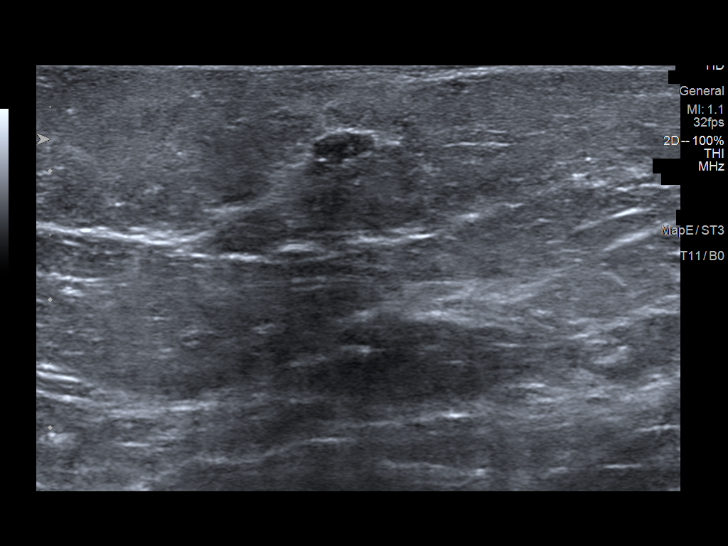
[im 9/21]
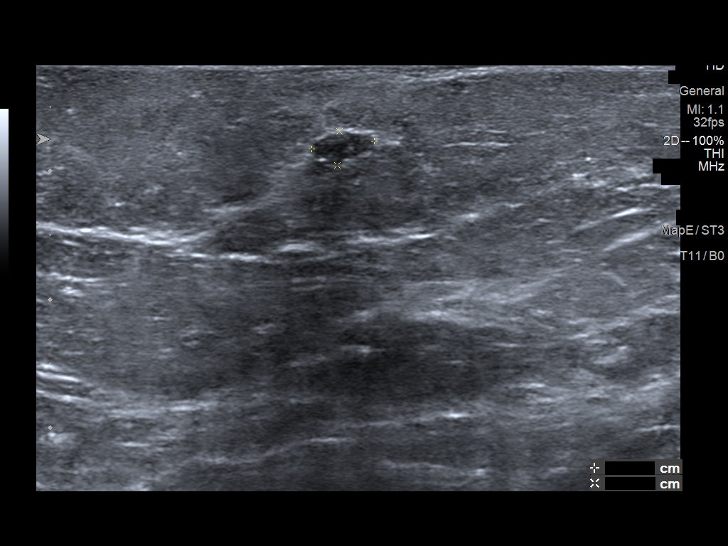
[im 11/21]
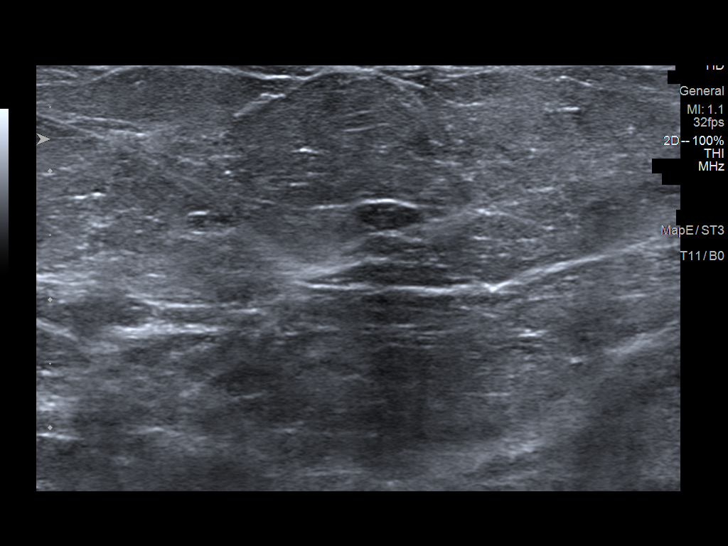
[im 13/21]
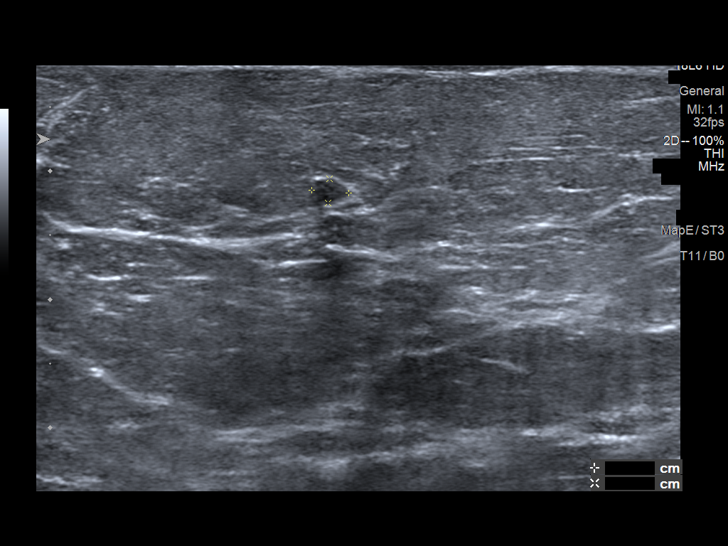
[im 14/21]
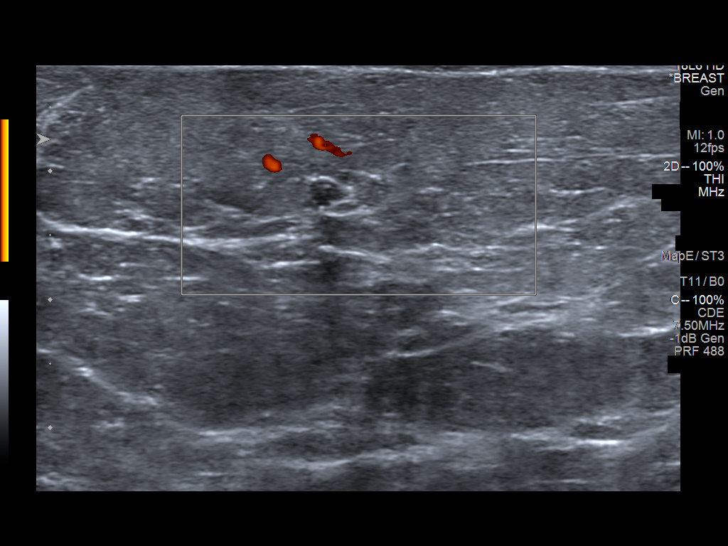
[im 16/21]
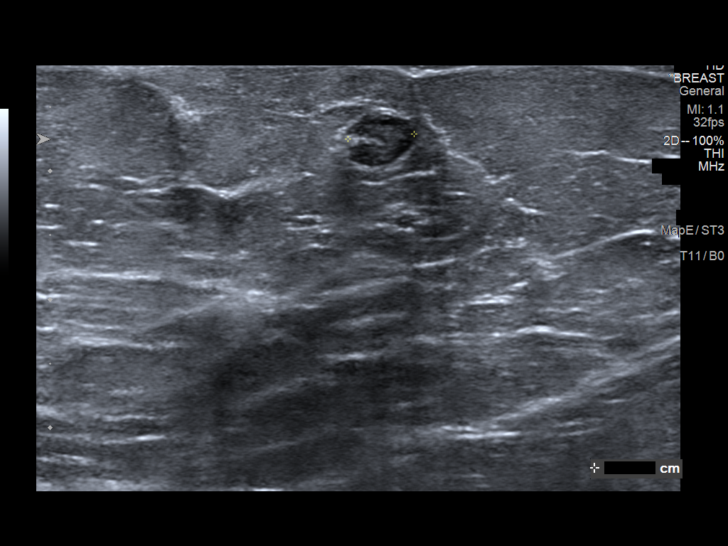
[im 17/21]
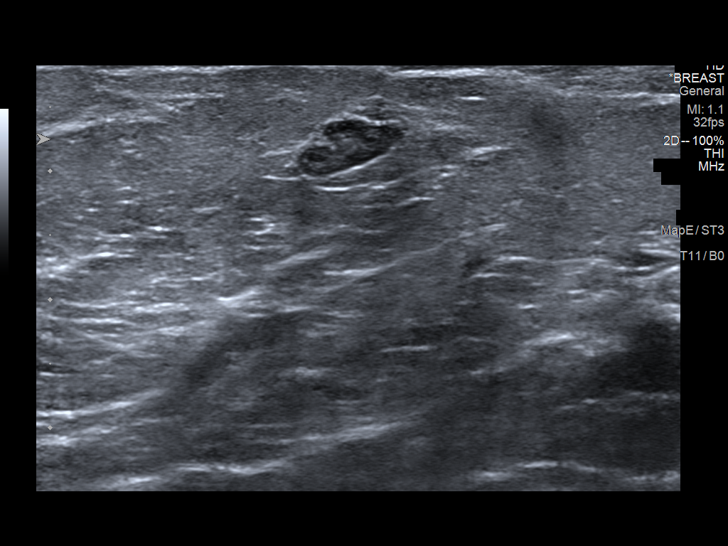
[im 19/21]
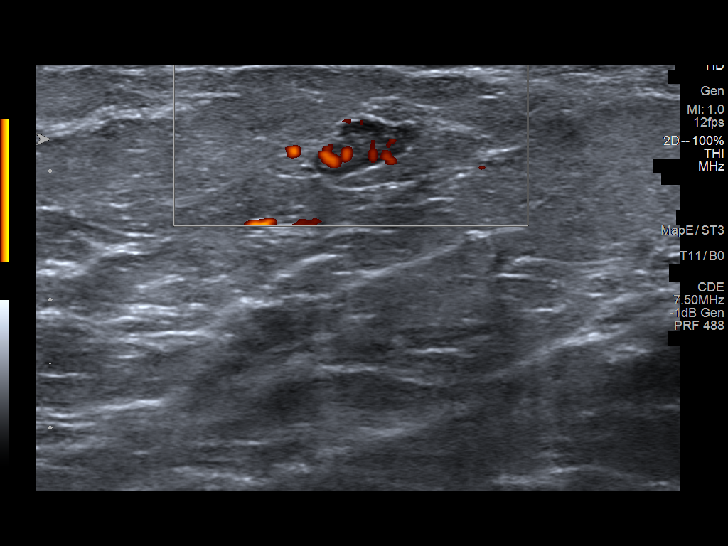
[im 21/21]
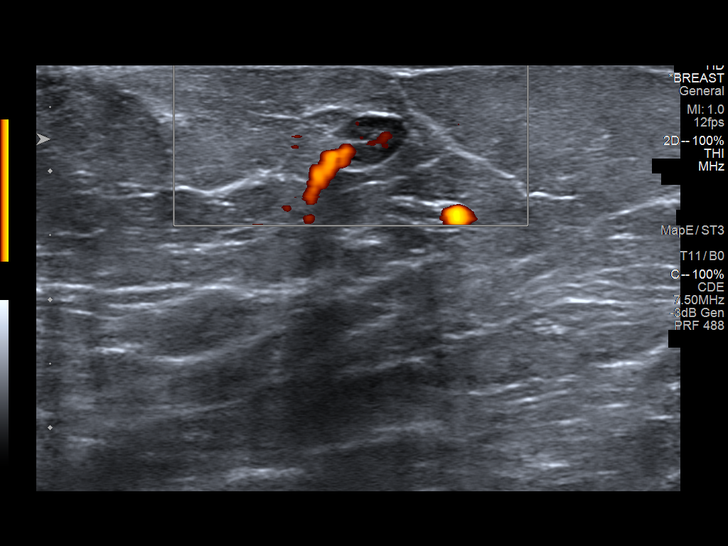

[13 of 21 positions shown; findings below may reference images not displayed]

ACR Breast Density Category b: There are scattered areas of
fibroglandular density.
FINDINGS: In the upper slightly outer quadrant of the right breast, anterior
depth there are 2 adjacent small oval circumscribed low-density
masses. A third similar appearing mass is seen at the approximate 8
to 9 o'clock position in the anterior depth.

Mammographic images were processed with CAD.

Ultrasound of the right breast at 12 o'clock, 7 cm from the nipple
demonstrates 3 adjacent circumscribed hypoechoic masses. One of
and the third measures 5 x 2 x 3 mm. In the right breast at 9
o'clock, 4 cm from the nipple there is a reniform hypoechoic mass
measuring 9 x 4 x 5 mm compatible with a intramammary lymph node..
IMPRESSION: 1. There are 3 probably benign masses in the right breast at 12
o'clock.

2. There is a benign intramammary lymph node in the right breast at
9 o'clock.

RECOMMENDATION:
Six-month follow-up diagnostic right breast mammogram and ultrasound
is recommended.

I have discussed the findings and recommendations with the patient.
Results were also provided in writing at the conclusion of the
visit. If applicable, a reminder letter will be sent to the patient
regarding the next appointment.

BI-RADS CATEGORY  3: Probably benign.

## 2019-03-18 IMAGING — MG DIGITAL DIAGNOSTIC UNILATERAL RIGHT MAMMOGRAM WITH TOMO AND CAD
8 series · 8 of 24 positions shown · non-contrast
Comparison: Previous exam(s).

CLINICAL DATA: Screening recall from baseline for a possible right
breast masses.

EXAM:
DIGITAL DIAGNOSTIC RIGHT MAMMOGRAM WITH CAD AND TOMO
ULTRASOUND RIGHT BREAST

[R CC synth-2D (1 of 2)]
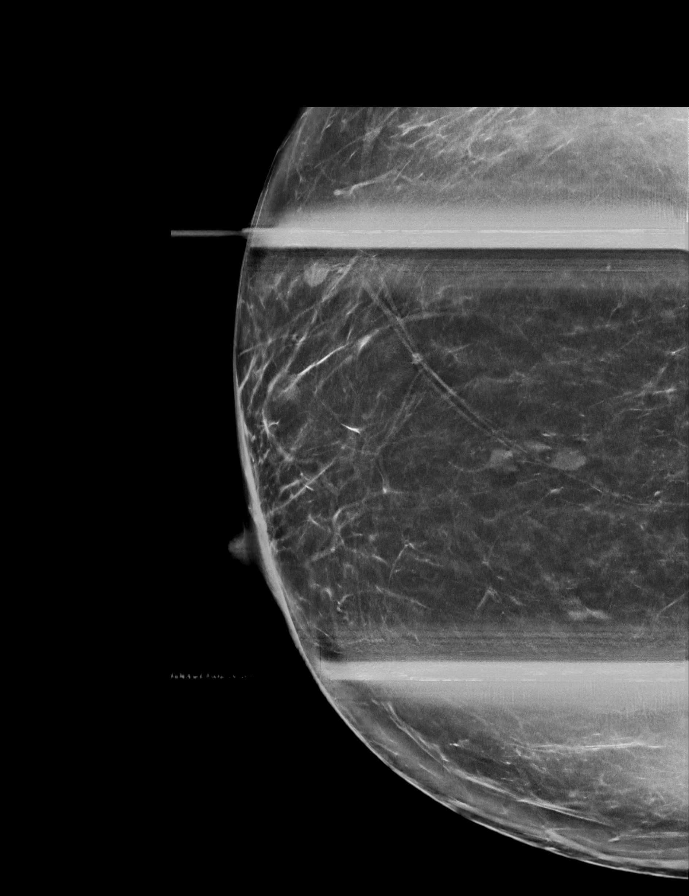

[R MLO synth-2D]
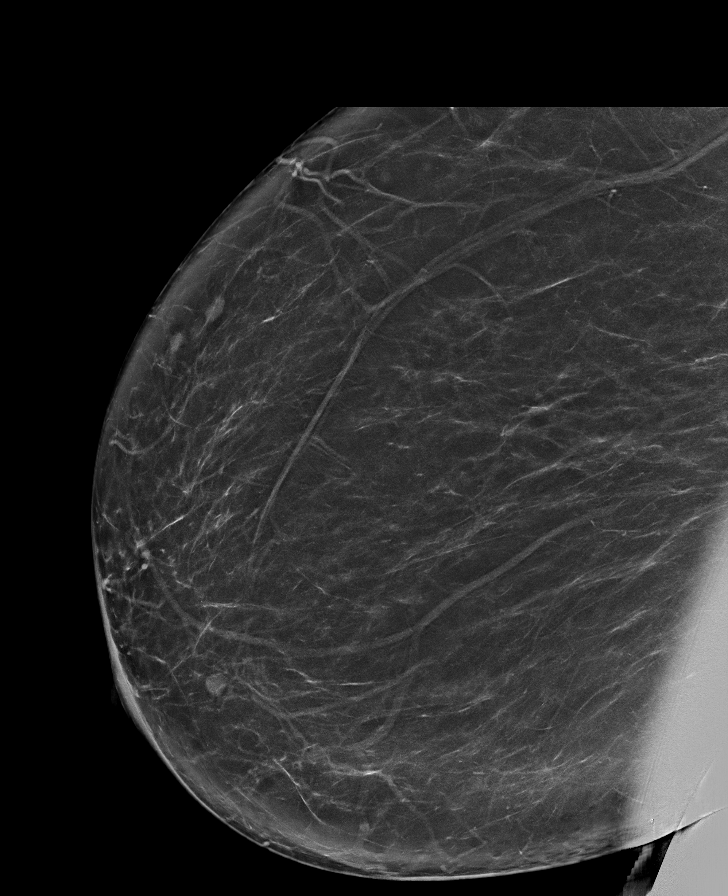

[R CC synth-2D (2 of 2)]
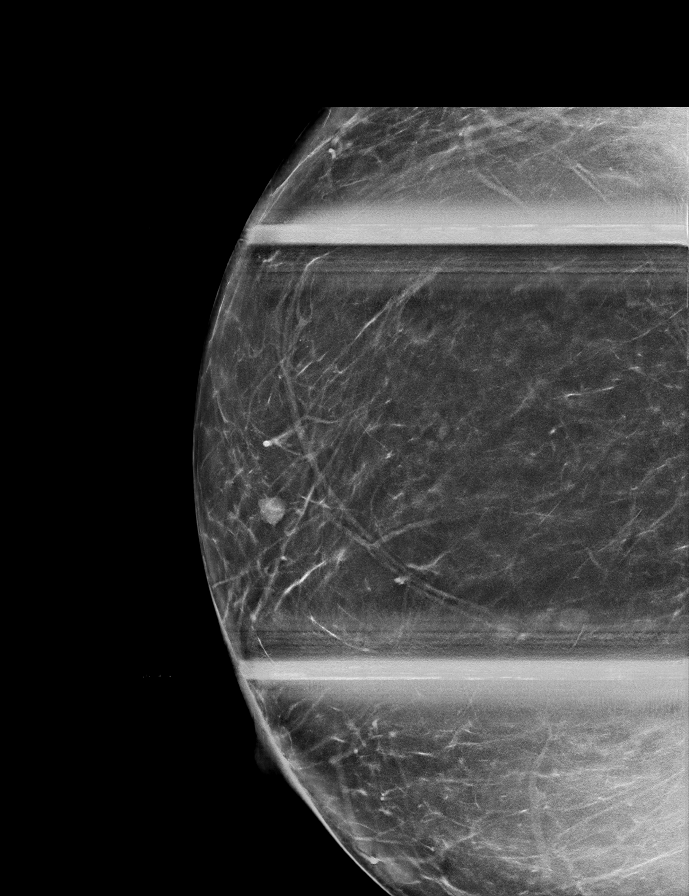

[R ML synth-2D]
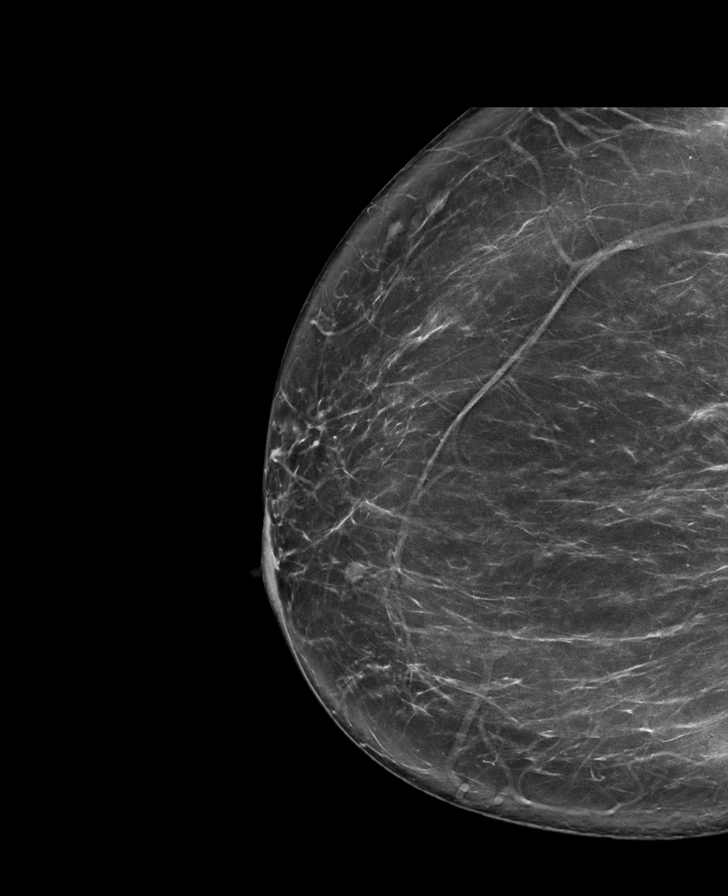

[R MLO tomo · tomo slice 47/92.0]
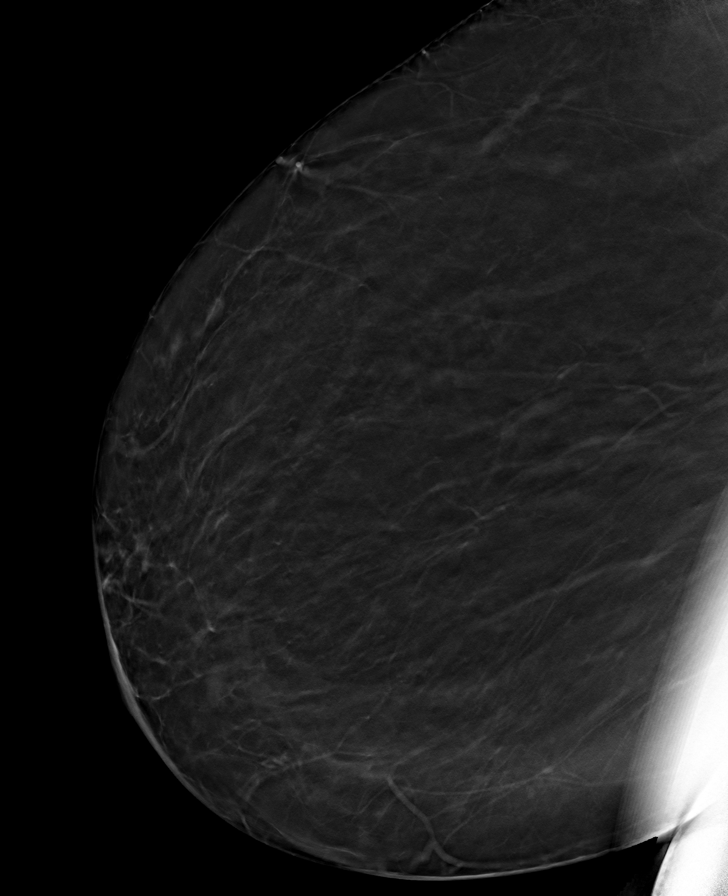

[R CC tomo (1 of 2) · tomo slice 42/83.0]
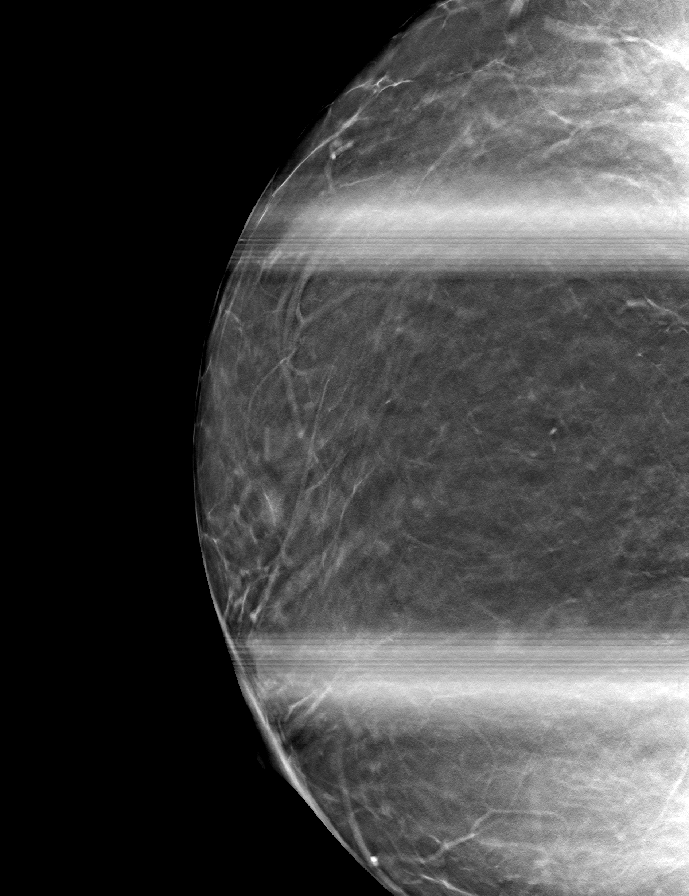

[R CC tomo (2 of 2) · tomo slice 40/79.0]
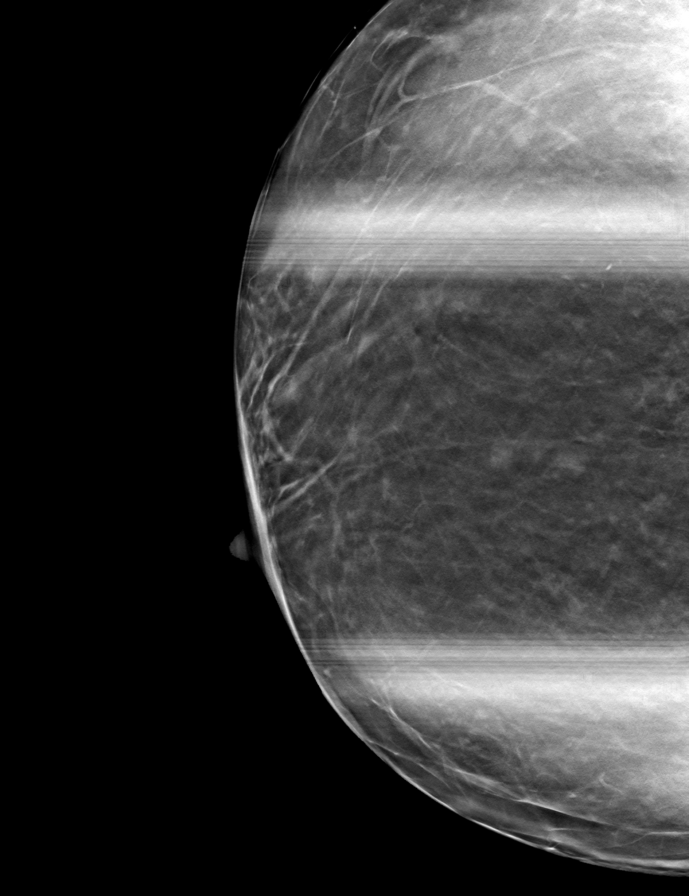

[R ML tomo · tomo slice 47/92.0]
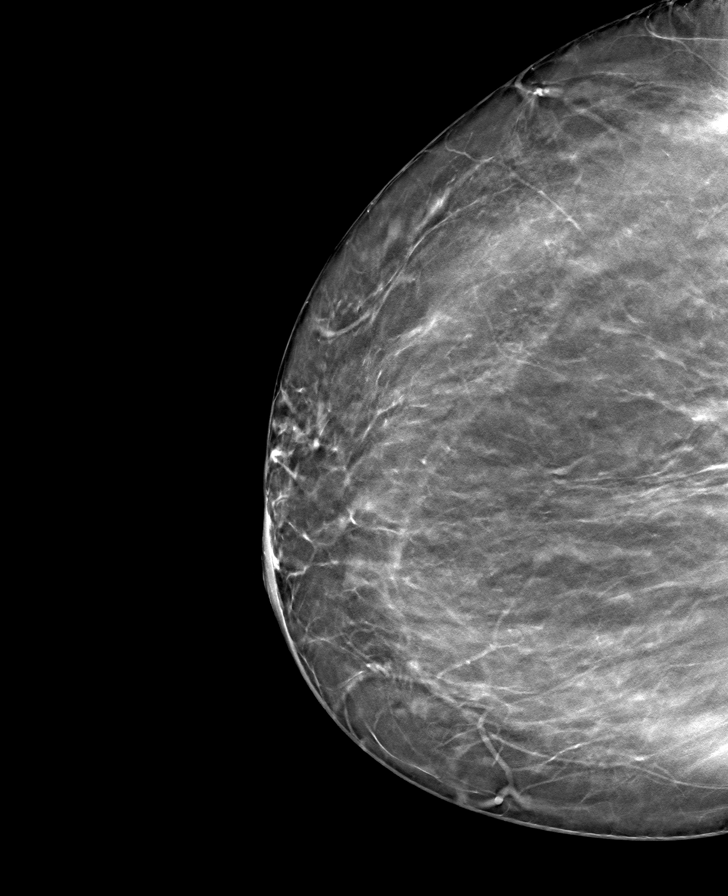

[8 of 24 positions shown; findings below may reference images not displayed]

ACR Breast Density Category b: There are scattered areas of
fibroglandular density.
FINDINGS: In the upper slightly outer quadrant of the right breast, anterior
depth there are 2 adjacent small oval circumscribed low-density
masses. A third similar appearing mass is seen at the approximate 8
to 9 o'clock position in the anterior depth.

Mammographic images were processed with CAD.

Ultrasound of the right breast at 12 o'clock, 7 cm from the nipple
demonstrates 3 adjacent circumscribed hypoechoic masses. One of
and the third measures 5 x 2 x 3 mm. In the right breast at 9
o'clock, 4 cm from the nipple there is a reniform hypoechoic mass
measuring 9 x 4 x 5 mm compatible with a intramammary lymph node..
IMPRESSION: 1. There are 3 probably benign masses in the right breast at 12
o'clock.

2. There is a benign intramammary lymph node in the right breast at
9 o'clock.

RECOMMENDATION:
Six-month follow-up diagnostic right breast mammogram and ultrasound
is recommended.

I have discussed the findings and recommendations with the patient.
Results were also provided in writing at the conclusion of the
visit. If applicable, a reminder letter will be sent to the patient
regarding the next appointment.

BI-RADS CATEGORY  3: Probably benign.

## 2019-06-29 DIAGNOSIS — G4733 Obstructive sleep apnea (adult) (pediatric): Secondary | ICD-10-CM | POA: Diagnosis not present

## 2019-07-04 DIAGNOSIS — D352 Benign neoplasm of pituitary gland: Secondary | ICD-10-CM | POA: Diagnosis not present

## 2019-07-04 DIAGNOSIS — D353 Benign neoplasm of craniopharyngeal duct: Secondary | ICD-10-CM | POA: Diagnosis not present

## 2019-07-04 DIAGNOSIS — D443 Neoplasm of uncertain behavior of pituitary gland: Secondary | ICD-10-CM | POA: Diagnosis not present

## 2019-07-20 DIAGNOSIS — D352 Benign neoplasm of pituitary gland: Secondary | ICD-10-CM | POA: Diagnosis not present

## 2019-07-20 DIAGNOSIS — D443 Neoplasm of uncertain behavior of pituitary gland: Secondary | ICD-10-CM | POA: Diagnosis not present

## 2019-07-20 DIAGNOSIS — I10 Essential (primary) hypertension: Secondary | ICD-10-CM | POA: Diagnosis not present

## 2019-07-20 DIAGNOSIS — D353 Benign neoplasm of craniopharyngeal duct: Secondary | ICD-10-CM | POA: Diagnosis not present

## 2019-09-12 ENCOUNTER — Other Ambulatory Visit: Payer: Self-pay | Admitting: Nurse Practitioner

## 2019-09-12 ENCOUNTER — Other Ambulatory Visit: Payer: Self-pay

## 2019-09-12 ENCOUNTER — Encounter: Payer: Federal, State, Local not specified - PPO | Admitting: Nurse Practitioner

## 2019-09-12 ENCOUNTER — Ambulatory Visit (INDEPENDENT_AMBULATORY_CARE_PROVIDER_SITE_OTHER): Payer: Federal, State, Local not specified - PPO | Admitting: Nurse Practitioner

## 2019-09-12 ENCOUNTER — Encounter: Payer: Self-pay | Admitting: Nurse Practitioner

## 2019-09-12 VITALS — BP 130/82 | HR 83 | Temp 97.6°F | Ht 63.0 in | Wt 300.6 lb

## 2019-09-12 DIAGNOSIS — Z136 Encounter for screening for cardiovascular disorders: Secondary | ICD-10-CM

## 2019-09-12 DIAGNOSIS — I152 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: Secondary | ICD-10-CM

## 2019-09-12 DIAGNOSIS — Z13220 Encounter for screening for lipoid disorders: Secondary | ICD-10-CM

## 2019-09-12 DIAGNOSIS — R739 Hyperglycemia, unspecified: Secondary | ICD-10-CM

## 2019-09-12 DIAGNOSIS — Z0001 Encounter for general adult medical examination with abnormal findings: Secondary | ICD-10-CM

## 2019-09-12 DIAGNOSIS — E559 Vitamin D deficiency, unspecified: Secondary | ICD-10-CM | POA: Diagnosis not present

## 2019-09-12 DIAGNOSIS — R600 Localized edema: Secondary | ICD-10-CM | POA: Diagnosis not present

## 2019-09-12 DIAGNOSIS — Z0000 Encounter for general adult medical examination without abnormal findings: Secondary | ICD-10-CM | POA: Diagnosis not present

## 2019-09-12 DIAGNOSIS — Z6841 Body Mass Index (BMI) 40.0 and over, adult: Secondary | ICD-10-CM

## 2019-09-12 NOTE — Assessment & Plan Note (Signed)
unability to loss weight despite life style modification for last 32months: small frequent meals, adequate oral hydration, low fat/carb/sugar, exercise daily 48mins (walking, riding bicycle).  I recommended referral to bariatric clinic, but she declined stating the process is time consuming and will not be able to accommodate with her current work schedule.  Repeat TSH, hgbA1c and lipid panel Encourage to continue diet and daily exercise.

## 2019-09-12 NOTE — Assessment & Plan Note (Addendum)
BP at goal but persistent LE edema despite medication compliance, increase activity and DASH diet. Reports compliance with CPAP machine as well. Has intermittent SOB with exertion. BP Readings from Last 3 Encounters:  09/12/19 130/82  03/09/19 137/81  12/22/18 122/74   Ordered echocardiogram Normal renal function Stop maxzide Start spironolactone 25mg  Repeat BMP in 2weeks

## 2019-09-12 NOTE — Progress Notes (Signed)
Subjective:    Patient ID: Sherry [name], female    DOB: Jul 17, 1965, 54 y.o.   MRN: [id number]  Patient presents today for CPE  eval of chronic conditions: edema, HTN and obesity.  HPI Hypertension BP at goal but persistent LE edema despite medication compliance, increase activity and DASH diet. Reports compliance with CPAP machine as well. Has intermittent SOB with exertion. BP Readings from Last 3 Encounters:  09/12/19 130/82  03/09/19 137/81  12/22/18 122/74   Ordered echocardiogram Normal renal function Stop maxzide Start spironolactone 25mg  Repeat BMP in 2weeks  Morbid obesity with BMI of 50.0-59.9, adult (HCC) unability to loss weight despite life style modification for last 38months: small frequent meals, adequate oral hydration, low fat/carb/sugar, exercise daily 4mins (walking, riding bicycle).  I recommended referral to bariatric clinic, but she declined stating the process is time consuming and will not be able to accommodate with her current work schedule.  Repeat TSH, hgbA1c and lipid panel Encourage to continue diet and daily exercise.    BP Readings from Last 3 Encounters:  09/12/19 130/82  03/09/19 137/81  12/22/18 122/74   Wt Readings from Last 3 Encounters:  09/12/19 (!) 300 lb 9.6 oz (136.4 kg)  03/09/19 298 lb 6.4 oz (135.4 kg)  12/22/18 (!) 308 lb 9.6 oz (140 kg)   Sexual History (orientation,birth control, marital status, STD):married, up to date with PAP, mammogram due 11/2019. Declined need for STD screen  Depression/Suicide: Depression screen [facility] 2/9 09/12/2019 03/09/2019 06/14/2018 06/07/2017  Decreased Interest 0 0 0 0  Down, Depressed, Hopeless 0 0 0 0  PHQ - 2 Score 0 0 0 0   Vision:up to date  Dental:up to date  Immunizations: (TDAP, Hep C screen, Pneumovax, Influenza, zoster)  Health Maintenance  Topic Date Due  . COVID-19 Vaccine (1) Never done  . Colon Cancer Screening  Never done  . Flu Shot  09/02/2019  .  Hepatitis C: One  time screening is recommended by Center for Disease Control  (CDC) for  adults born from [birth year] through 1965.   09/11/2020*  . Pap Smear  06/07/2020  . Mammogram  11/02/2020  . Tetanus Vaccine  09/21/2028  . HIV Screening  Completed  *Topic was postponed. The date shown is not the original due date.   Fall Risk: Fall Risk  09/12/2019 03/09/2019 06/14/2018 06/07/2017  Falls in the past year? 0 0 1 No  Number falls in past yr: 0 - 0 -  Injury with Fall? 0 - 0 -   Advanced Directive: Advanced Directives 06/07/2017  Does Patient Have a Medical Advance Directive? Yes  Type of Paramedic of [place];Living will  Copy of [place] in Chart? No - copy requested    Medications and allergies reviewed with patient and updated if appropriate.  Patient Active Problem List   Diagnosis Date Noted  . Abnormal uterine bleeding 11/13/2018  . Bilateral leg edema 09/22/2018  . Fatigue 03/08/2018  . Morbid obesity with BMI of 50.0-59.9, adult ([place]) 03/08/2018  . EEG abnormality 02/22/2018  . OSA (obstructive sleep apnea) 01/20/2018  . Hypertension 01/06/2018  . History of pituitary surgery 12/14/2017  . Breast mass, right 10/20/2017  . Prolactinoma ([place]) 08/10/2011  . Benign neoplasm of pituitary gland and craniopharyngeal duct ([place]) 02/01/1898  . Hyperpituitarism ([place]) 02/01/1898  . Sleep disorder 02/01/1898  . Vitamin D deficiency 02/01/1898    Current Outpatient Medications on File Prior to Visit  Medication Sig Dispense Refill  .  Blood Pressure Monitoring (OMRON 5 SERIES BP MONITOR) DEVI     . bromocriptine (PARLODEL) 2.5 MG tablet TAKE ONE (1) TABLET BY MOUTH ONCE DAILY    . Cholecalciferol (VITAMIN D3) 5000 units TABS Take by mouth.    [name] lisinopril (ZESTRIL) 10 MG tablet Take 1 tablet (10 mg total) by mouth daily. 90 tablet 3  . Multiple Vitamins-Minerals (MULTIVITAMIN ADULT PO) Take by mouth.    . Omega-3 Fatty Acids (FISH OIL CONCENTRATE) 300 MG CAPS  Take 500 mg by mouth.    . triamcinolone cream (KENALOG) 0.5 % Apply 1 application topically 2 (two) times daily. 454 g 1   No current facility-administered medications on file prior to visit.    Past Medical History:  Diagnosis Date  . Brain tumor (benign) ([place]) 1996   benign pituitary adenoma  . Chickenpox   . OSA (obstructive sleep apnea)     Past Surgical History:  Procedure Laterality Date  . CRANIECTOMY / CRANIOTOMY FOR EXCISION OF BRAIN TUMOR  1996   pituitary adenoma  . WISDOM TOOTH EXTRACTION      Social History   Socioeconomic History  . Marital status: Married    Spouse name: Not on file  . Number of children: 0  . Years of education: Not on file  . Highest education level: Not on file  Occupational History  . Not on file  Tobacco Use  . Smoking status: Never Smoker  . Smokeless tobacco: Never Used  Vaping Use  . Vaping Use: Never used  Substance and Sexual Activity  . Alcohol use: Never  . Drug use: Never  . Sexual activity: Yes  Other Topics Concern  . Not on file  Social History Narrative  . Not on file   Social Determinants of Health   Financial Resource Strain:   . Difficulty of Paying Living Expenses:   Food Insecurity:   . Worried About Charity fundraiser in the Last Year:   . Arboriculturist in the Last Year:   Transportation Needs:   . Film/video editor (Medical):   [name] Lack of Transportation (Non-Medical):   Physical Activity:   . Days of Exercise per Week:   . Minutes of Exercise per Session:   Stress:   . Feeling of Stress :   Social Connections:   . Frequency of Communication with Friends and Family:   . Frequency of Social Gatherings with Friends and Family:   . Attends Religious Services:   . Active Member of Clubs or Organizations:   . Attends Archivist Meetings:   [name] Marital Status:     Family History  Problem Relation Age of Onset  . Hypertension Mother   . Heart attack Father 53       result of DVT    . Pulmonary embolism Father   . Hypertension Maternal Aunt   . Cancer Maternal Aunt        breast cancer  . CVA Maternal Aunt 70  . Stroke Maternal Aunt 70  . Breast cancer Maternal Aunt   . Hypertension Maternal Uncle         Review of Systems  Constitutional: Negative for fever, malaise/fatigue and weight loss.  HENT: Negative for congestion and sore throat.   Eyes:       Negative for visual changes  Respiratory: Positive for shortness of breath. Negative for cough, hemoptysis, sputum production and wheezing.   Cardiovascular: Positive for leg swelling. Negative for chest pain, palpitations,  orthopnea and claudication.  Gastrointestinal: Negative for blood in stool, constipation, diarrhea and heartburn.  Genitourinary: Negative for dysuria, frequency and urgency.  Musculoskeletal: Negative for falls, joint pain and myalgias.  Skin: Negative for rash.  Neurological: Negative for dizziness, sensory change and headaches.  Endo/Heme/Allergies: Does not bruise/bleed easily.  Psychiatric/Behavioral: Negative for depression, substance abuse and suicidal ideas. The patient is not nervous/anxious and does not have insomnia.     Objective:   Vitals:   09/12/19 1306  BP: 130/82  Pulse: 83  Temp: 97.6 F (36.4 C)  SpO2: 99%    Body mass index is 53.25 kg/m.   Physical Examination:  Physical Exam Vitals reviewed.  Constitutional:      General: She is not in acute distress.    Appearance: She is well-developed. She is obese.  HENT:     Right Ear: Tympanic membrane, ear canal and external ear normal.     Left Ear: Tympanic membrane, ear canal and external ear normal.  Eyes:     Extraocular Movements: Extraocular movements intact.     Conjunctiva/sclera: Conjunctivae normal.  Cardiovascular:     Rate and Rhythm: Normal rate and regular rhythm.     Pulses: Normal pulses.     Heart sounds: Normal heart sounds.  Pulmonary:     Effort: Pulmonary effort is normal. No  respiratory distress.     Breath sounds: Normal breath sounds.  Chest:     Chest wall: No tenderness.     Breasts:        Right: Normal.        Left: Normal.  Abdominal:     General: There is no distension.     Palpations: Abdomen is soft.     Tenderness: There is no abdominal tenderness.  Genitourinary:    Comments: Deferred to next year Musculoskeletal:        General: Normal range of motion.     Cervical back: Normal range of motion and neck supple.     Right lower leg: No edema.     Left lower leg: Edema present.  Lymphadenopathy:     Cervical: No cervical adenopathy.     Upper Body:     Right upper body: No supraclavicular, axillary or pectoral adenopathy.     Left upper body: No supraclavicular, axillary or pectoral adenopathy.  Skin:    General: Skin is warm and dry.     Findings: No erythema.  Neurological:     Mental Status: She is alert and oriented to person, place, and time.     Deep Tendon Reflexes: Reflexes are normal and symmetric.  Psychiatric:        Mood and Affect: Mood normal.        Behavior: Behavior normal.        Thought Content: Thought content normal.    ASSESSMENT and PLAN: This visit occurred during the SARS-CoV-2 public health emergency.  Safety protocols were in place, including screening questions prior to the visit, additional usage of staff PPE, and extensive cleaning of exam room while observing appropriate contact time as indicated for disinfecting solutions.   Devynn was seen today for annual exam.  Diagnoses and all orders for this visit:  Encounter for preventative adult health care exam with abnormal findings -     Cancel: CBC with Differential/Platelet -     Cancel: Comprehensive metabolic panel -     Cancel: Lipid panel -     CBC with Differential/Platelet; Future -  Comprehensive metabolic panel; Future -     Lipid panel; Future -     TSH; Future  Vitamin D deficiency -     Cancel: Vitamin D 1,25 dihydroxy -      VITAMIN D 25 Hydroxy (Vit-D Deficiency, Fractures); Future  Bilateral leg edema -     Cancel: ECHOCARDIOGRAM COMPLETE; Future -     ECHOCARDIOGRAM COMPLETE; Future -     spironolactone (ALDACTONE) 25 MG tablet; Take 1 tablet (25 mg total) by mouth daily. -     Basic metabolic panel; Future  Hypertension due to endocrine disorder -     Cancel: ECHOCARDIOGRAM COMPLETE; Future -     ECHOCARDIOGRAM COMPLETE; Future -     spironolactone (ALDACTONE) 25 MG tablet; Take 1 tablet (25 mg total) by mouth daily. -     Basic metabolic panel; Future  Morbid obesity with BMI of 50.0-59.9, adult ([place])  Encounter for lipid screening for cardiovascular disease -     Cancel: Lipid panel -     Lipid panel; Future  Hyperglycemia -     Cancel: Hemoglobin A1c -     Hemoglobin A1c; Future        Problem List Items Addressed This Visit      Cardiovascular and Mediastinum   Hypertension    BP at goal but persistent LE edema despite medication compliance, increase activity and DASH diet. Reports compliance with CPAP machine as well. Has intermittent SOB with exertion. BP Readings from Last 3 Encounters:  09/12/19 130/82  03/09/19 137/81  12/22/18 122/74   Ordered echocardiogram Normal renal function Stop maxzide Start spironolactone 25mg  Repeat BMP in 2weeks      Relevant Medications   spironolactone (ALDACTONE) 25 MG tablet   Other Relevant Orders   ECHOCARDIOGRAM COMPLETE   Basic metabolic panel     Other   Bilateral leg edema   Relevant Medications   spironolactone (ALDACTONE) 25 MG tablet   Other Relevant Orders   ECHOCARDIOGRAM COMPLETE   Basic metabolic panel   Morbid obesity with BMI of 50.0-59.9, adult (HCC)    unability to loss weight despite life style modification for last 24months: small frequent meals, adequate oral hydration, low fat/carb/sugar, exercise daily 75mins (walking, riding bicycle).  I recommended referral to bariatric clinic, but she declined stating the  process is time consuming and will not be able to accommodate with her current work schedule.  Repeat TSH, hgbA1c and lipid panel Encourage to continue diet and daily exercise.        Vitamin D deficiency   Relevant Orders   VITAMIN D 25 Hydroxy (Vit-D Deficiency, Fractures)    Other Visit Diagnoses    Encounter for preventative adult health care exam with abnormal findings    -  Primary   Relevant Orders   CBC with Differential/Platelet   Comprehensive metabolic panel   Lipid panel   TSH   Encounter for lipid screening for cardiovascular disease       Relevant Orders   Lipid panel   Hyperglycemia       Relevant Orders   Hemoglobin A1c      Follow up: Return in about 4 weeks (around 10/10/2019) for edema and weight gain (69mins, video appt).  Wilfred Lacy, NP

## 2019-09-12 NOTE — Patient Instructions (Addendum)
Mammogram is due 11/2019 Schedule appt with GI for colonoscopy  Let me know if you are ok with referral to bariatric clinic.  Go to lab for blood draw.  You will be contacted to schedule an appt for echocardiogram   Preventive Care 33-54 Years Old, Female Preventive care refers to visits with your health care provider and lifestyle choices that can promote health and wellness. This includes:  A yearly physical exam. This may also be called an annual well check.  Regular dental visits and eye exams.  Immunizations.  Screening for certain conditions.  Healthy lifestyle choices, such as eating a healthy diet, getting regular exercise, not using drugs or products that contain nicotine and tobacco, and limiting alcohol use. What can I expect for my preventive care visit? Physical exam Your health care provider will check your:  Height and weight. This may be used to calculate body mass index (BMI), which tells if you are at a healthy weight.  Heart rate and blood pressure.  Skin for abnormal spots. Counseling Your health care provider may ask you questions about your:  Alcohol, tobacco, and drug use.  Emotional well-being.  Home and relationship well-being.  Sexual activity.  Eating habits.  Work and work Statistician.  Method of birth control.  Menstrual cycle.  Pregnancy history. What immunizations do I need?  Influenza (flu) vaccine  This is recommended every year. Tetanus, diphtheria, and pertussis (Tdap) vaccine  You may need a Td booster every 10 years. Varicella (chickenpox) vaccine  You may need this if you have not been vaccinated. Zoster (shingles) vaccine  You may need this after age 86. Measles, mumps, and rubella (MMR) vaccine  You may need at least one dose of MMR if you were born in 1957 or later. You may also need a second dose. Pneumococcal conjugate (PCV13) vaccine  You may need this if you have certain conditions and were not  previously vaccinated. Pneumococcal polysaccharide (PPSV23) vaccine  You may need one or two doses if you smoke cigarettes or if you have certain conditions. Meningococcal conjugate (MenACWY) vaccine  You may need this if you have certain conditions. Hepatitis A vaccine  You may need this if you have certain conditions or if you travel or work in places where you may be exposed to hepatitis A. Hepatitis B vaccine  You may need this if you have certain conditions or if you travel or work in places where you may be exposed to hepatitis B. Haemophilus influenzae type b (Hib) vaccine  You may need this if you have certain conditions. Human papillomavirus (HPV) vaccine  If recommended by your health care provider, you may need three doses over 6 months. You may receive vaccines as individual doses or as more than one vaccine together in one shot (combination vaccines). Talk with your health care provider about the risks and benefits of combination vaccines. What tests do I need? Blood tests  Lipid and cholesterol levels. These may be checked every 5 years, or more frequently if you are over 65 years old.  Hepatitis C test.  Hepatitis B test. Screening  Lung cancer screening. You may have this screening every year starting at age 21 if you have a 30-pack-year history of smoking and currently smoke or have quit within the past 15 years.  Colorectal cancer screening. All adults should have this screening starting at age 67 and continuing until age 82. Your health care provider may recommend screening at age 33 if you are at increased  risk. You will have tests every 1-10 years, depending on your results and the type of screening test.  Diabetes screening. This is done by checking your blood sugar (glucose) after you have not eaten for a while (fasting). You may have this done every 1-3 years.  Mammogram. This may be done every 1-2 years. Talk with your health care provider about when you  should start having regular mammograms. This may depend on whether you have a family history of breast cancer.  BRCA-related cancer screening. This may be done if you have a family history of breast, ovarian, tubal, or peritoneal cancers.  Pelvic exam and Pap test. This may be done every 3 years starting at age 54. Starting at age 61, this may be done every 5 years if you have a Pap test in combination with an HPV test. Other tests  Sexually transmitted disease (STD) testing.  Bone density scan. This is done to screen for osteoporosis. You may have this scan if you are at high risk for osteoporosis. Follow these instructions at home: Eating and drinking  Eat a diet that includes fresh fruits and vegetables, whole grains, lean protein, and low-fat dairy.  Take vitamin and mineral supplements as recommended by your health care provider.  Do not drink alcohol if: ? Your health care provider tells you not to drink. ? You are pregnant, may be pregnant, or are planning to become pregnant.  If you drink alcohol: ? Limit how much you have to 0-1 drink a day. ? Be aware of how much alcohol is in your drink. In the [place] one drink equals one 12 oz bottle of beer (355 mL), one 5 oz glass of wine (148 mL), or one 1 oz glass of hard liquor (44 mL). Lifestyle  Take daily care of your teeth and gums.  Stay active. Exercise for at least 30 minutes on 5 or more days each week.  Do not use any products that contain nicotine or tobacco, such as cigarettes, e-cigarettes, and chewing tobacco. If you need help quitting, ask your health care provider.  If you are sexually active, practice safe sex. Use a condom or other form of birth control (contraception) in order to prevent pregnancy and STIs (sexually transmitted infections).  If told by your health care provider, take low-dose aspirin daily starting at age 25. What's next?  Visit your health care provider once a year for a well check  visit.  Ask your health care provider how often you should have your eyes and teeth checked.  Stay up to date on all vaccines. This information is not intended to replace advice given to you by your health care provider. Make sure you discuss any questions you have with your health care provider. Document Revised: 09/29/2017 Document Reviewed: 09/29/2017 Elsevier Patient Education  2020 [employer].

## 2019-09-13 LAB — CBC WITH DIFFERENTIAL/PLATELET
Basophils Absolute: 0 10*3/uL (ref 0.0–0.2)
Basos: 0 %
EOS (ABSOLUTE): 0.1 10*3/uL (ref 0.0–0.4)
Eos: 1 %
Hematocrit: 42.2 % (ref 34.0–46.6)
Hemoglobin: 14 g/dL (ref 11.1–15.9)
Immature Grans (Abs): 0 10*3/uL (ref 0.0–0.1)
Immature Granulocytes: 0 %
Lymphocytes Absolute: 1.7 10*3/uL (ref 0.7–3.1)
Lymphs: 12 %
MCH: 29.9 pg (ref 26.6–33.0)
MCHC: 33.2 g/dL (ref 31.5–35.7)
MCV: 90 fL (ref 79–97)
Monocytes Absolute: 0.8 10*3/uL (ref 0.1–0.9)
Monocytes: 5 %
Neutrophils Absolute: 11.5 10*3/uL — ABNORMAL HIGH (ref 1.4–7.0)
Neutrophils: 82 %
Platelets: 350 10*3/uL (ref 150–450)
RBC: 4.68 x10E6/uL (ref 3.77–5.28)
RDW: 14 % (ref 11.7–15.4)
WBC: 14.1 10*3/uL — ABNORMAL HIGH (ref 3.4–10.8)

## 2019-09-13 LAB — COMPREHENSIVE METABOLIC PANEL
ALT: 18 IU/L (ref 0–32)
AST: 14 IU/L (ref 0–40)
Albumin/Globulin Ratio: 1.3 (ref 1.2–2.2)
Albumin: 3.9 g/dL (ref 3.8–4.9)
Alkaline Phosphatase: 96 IU/L (ref 48–121)
BUN/Creatinine Ratio: 9 (ref 9–23)
BUN: 8 mg/dL (ref 6–24)
Bilirubin Total: 0.3 mg/dL (ref 0.0–1.2)
CO2: 21 mmol/L (ref 20–29)
Calcium: 9.6 mg/dL (ref 8.7–10.2)
Chloride: 99 mmol/L (ref 96–106)
Creatinine, Ser: 0.88 mg/dL (ref 0.57–1.00)
GFR calc Af Amer: 86 mL/min/{1.73_m2} (ref >59)
GFR calc non Af Amer: 75 mL/min/{1.73_m2} (ref >59)
Globulin, Total: 3 g/dL (ref 1.5–4.5)
Glucose: 87 mg/dL (ref 65–99)
Potassium: 4 mmol/L (ref 3.5–5.2)
Sodium: 136 mmol/L (ref 134–144)
Total Protein: 6.9 g/dL (ref 6.0–8.5)

## 2019-09-13 LAB — HEMOGLOBIN A1C
Est. average glucose Bld gHb Est-mCnc: 137 mg/dL
Hgb A1c MFr Bld: 6.4 % — ABNORMAL HIGH (ref 4.8–5.6)

## 2019-09-13 LAB — LIPID PANEL
Chol/HDL Ratio: 4 ratio (ref 0.0–4.4)
Cholesterol, Total: 196 mg/dL (ref 100–199)
HDL: 49 mg/dL (ref >39)
LDL Chol Calc (NIH): 128 mg/dL — ABNORMAL HIGH (ref 0–99)
Triglycerides: 107 mg/dL (ref 0–149)
VLDL Cholesterol Cal: 19 mg/dL (ref 5–40)

## 2019-09-13 LAB — TSH: TSH: 0.577 u[IU]/mL (ref 0.450–4.500)

## 2019-09-13 LAB — VITAMIN D 25 HYDROXY (VIT D DEFICIENCY, FRACTURES): Vit D, 25-Hydroxy: 61.4 ng/mL (ref 30.0–100.0)

## 2019-09-18 MED ORDER — SPIRONOLACTONE 25 MG PO TABS: 25.0000 mg | ORAL_TABLET | Freq: Every day | ORAL | 1 refills | Status: DC

## 2019-10-02 DIAGNOSIS — G4733 Obstructive sleep apnea (adult) (pediatric): Secondary | ICD-10-CM | POA: Diagnosis not present

## 2019-10-09 ENCOUNTER — Other Ambulatory Visit: Payer: Self-pay

## 2019-10-09 ENCOUNTER — Ambulatory Visit (HOSPITAL_COMMUNITY): Payer: Federal, State, Local not specified - PPO | Attending: Cardiology

## 2019-10-09 DIAGNOSIS — R600 Localized edema: Secondary | ICD-10-CM | POA: Diagnosis not present

## 2019-10-09 DIAGNOSIS — I152 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: Secondary | ICD-10-CM | POA: Diagnosis not present

## 2019-10-09 LAB — ECHOCARDIOGRAM COMPLETE
Area-P 1/2: 3.32 cm2
S' Lateral: 2.9 cm

## 2019-10-10 DIAGNOSIS — D352 Benign neoplasm of pituitary gland: Secondary | ICD-10-CM | POA: Diagnosis not present

## 2019-10-10 DIAGNOSIS — D353 Benign neoplasm of craniopharyngeal duct: Secondary | ICD-10-CM | POA: Diagnosis not present

## 2019-10-12 ENCOUNTER — Other Ambulatory Visit: Payer: Self-pay

## 2019-10-12 ENCOUNTER — Ambulatory Visit: Payer: Federal, State, Local not specified - PPO | Admitting: Nurse Practitioner

## 2019-10-12 ENCOUNTER — Encounter: Payer: Self-pay | Admitting: Nurse Practitioner

## 2019-10-12 VITALS — BP 110/64 | HR 74 | Temp 98.3°F | Ht 63.0 in | Wt 296.4 lb

## 2019-10-12 DIAGNOSIS — I152 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: Secondary | ICD-10-CM

## 2019-10-12 DIAGNOSIS — R600 Localized edema: Secondary | ICD-10-CM | POA: Diagnosis not present

## 2019-10-12 NOTE — Patient Instructions (Signed)
Maintain current medications Get lab completed

## 2019-10-12 NOTE — Assessment & Plan Note (Signed)
improved BP with spironolactone and lisinopril Denies any adverse side effects BP Readings from Last 3 Encounters:  10/12/19 110/64  09/12/19 130/82  03/09/19 137/81   Repeat BMP and maintain current medications

## 2019-10-12 NOTE — Assessment & Plan Note (Addendum)
associated with paresthesia/increased sensitivity of feet.(normal TSH, B12&folate, and vitamin D), last hgbA1c of 6.4: Declined metformin declined podiatry referral, declined use of gabapentin. Declined referral to weight loss clinic Improved LE edema with spironolactone Repeat BMP

## 2019-10-12 NOTE — Progress Notes (Signed)
Subjective:  Patient ID: Sherry [name], female    DOB: 1965/11/14  Age: 54 y.o. MRN: [id number]  CC: Follow-up (4 week f/u on weight loss and edema. Pt states she still has been experiencing swelling all over her body but it is worse in her feet. )  HPI  Hypertension improved BP with spironolactone and lisinopril Denies any adverse side effects BP Readings from Last 3 Encounters:  10/12/19 110/64  09/12/19 130/82  03/09/19 137/81   Repeat BMP and maintain current medications  Bilateral leg edema associated with paresthesia/increased sensitivity of feet.(normal TSH, B12&folate, and vitamin D), last hgbA1c of 6.4: Declined metformin declined podiatry referral, declined use of gabapentin. Declined referral to weight loss clinic Improved LE edema with spironolactone Repeat BMP  Wt Readings from Last 3 Encounters:  10/12/19 296 lb 6.4 oz (134.4 kg)  09/12/19 (!) 300 lb 9.6 oz (136.4 kg)  03/09/19 298 lb 6.4 oz (135.4 kg)   BP Readings from Last 3 Encounters:  10/12/19 110/64  09/12/19 130/82  03/09/19 137/81   Reviewed past Medical, Social and Family history today.  Outpatient Medications Prior to Visit  Medication Sig Dispense Refill  . Blood Pressure Monitoring (OMRON 5 SERIES BP MONITOR) DEVI     . bromocriptine (PARLODEL) 2.5 MG tablet TAKE ONE (1) TABLET BY MOUTH ONCE DAILY    . Cholecalciferol (VITAMIN D3) 5000 units TABS Take by mouth.    [name] lisinopril (ZESTRIL) 10 MG tablet Take 1 tablet (10 mg total) by mouth daily. 90 tablet 3  . Multiple Vitamins-Minerals (MULTIVITAMIN ADULT PO) Take by mouth.    . Omega-3 Fatty Acids (FISH OIL CONCENTRATE) 300 MG CAPS Take 500 mg by mouth.    . spironolactone (ALDACTONE) 25 MG tablet Take 1 tablet (25 mg total) by mouth daily. 90 tablet 1  . triamcinolone cream (KENALOG) 0.5 % Apply 1 application topically 2 (two) times daily. 454 g 1   No facility-administered medications prior to visit.    ROS See HPI  Objective:  BP  110/64 (BP Location: Left Arm, Patient Position: Sitting, Cuff Size: Normal)   Pulse 74   Temp 98.3 F (36.8 C) (Temporal)   Ht 5\' 3"  (1.6 m)   Wt 296 lb 6.4 oz (134.4 kg)   SpO2 98%   BMI 52.50 kg/m   Physical Exam Constitutional:      Appearance: She is obese.  Cardiovascular:     Rate and Rhythm: Normal rate and regular rhythm.     Pulses: Normal pulses.     Heart sounds: Normal heart sounds.  Pulmonary:     Effort: Pulmonary effort is normal.     Breath sounds: Normal breath sounds.  Musculoskeletal:     Right lower leg: No edema.     Left lower leg: No edema.  Neurological:     Mental Status: She is alert and oriented to person, place, and time.    Assessment & Plan:  This visit occurred during the SARS-CoV-2 public health emergency.  Safety protocols were in place, including screening questions prior to the visit, additional usage of staff PPE, and extensive cleaning of exam room while observing appropriate contact time as indicated for disinfecting solutions.   Shamieka was seen today for follow-up.  Diagnoses and all orders for this visit:  Hypertension due to endocrine disorder -     Basic metabolic panel; Future  Bilateral leg edema -     Basic metabolic panel; Future    Problem List Items Addressed This  Visit      Cardiovascular and Mediastinum   Hypertension - Primary    improved BP with spironolactone and lisinopril Denies any adverse side effects BP Readings from Last 3 Encounters:  10/12/19 110/64  09/12/19 130/82  03/09/19 137/81   Repeat BMP and maintain current medications      Relevant Orders   Basic metabolic panel     Other   Bilateral leg edema    associated with paresthesia/increased sensitivity of feet.(normal TSH, B12&folate, and vitamin D), last hgbA1c of 6.4: Declined metformin declined podiatry referral, declined use of gabapentin. Declined referral to weight loss clinic Improved LE edema with spironolactone Repeat BMP       Relevant Orders   Basic metabolic panel      Follow-up: Return in about 6 months (around 04/10/2020) for HTN and prediabetes (F2F, 34mins).  Wilfred Lacy, NP

## 2019-10-25 ENCOUNTER — Other Ambulatory Visit: Payer: Self-pay | Admitting: Nurse Practitioner

## 2019-10-25 DIAGNOSIS — I152 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: Secondary | ICD-10-CM | POA: Diagnosis not present

## 2019-10-26 LAB — BASIC METABOLIC PANEL
BUN/Creatinine Ratio: 13 (ref 9–23)
BUN: 11 mg/dL (ref 6–24)
CO2: 23 mmol/L (ref 20–29)
Calcium: 9.4 mg/dL (ref 8.7–10.2)
Chloride: 104 mmol/L (ref 96–106)
Creatinine, Ser: 0.84 mg/dL (ref 0.57–1.00)
GFR calc Af Amer: 91 mL/min/{1.73_m2} (ref >59)
GFR calc non Af Amer: 79 mL/min/{1.73_m2} (ref >59)
Glucose: 89 mg/dL (ref 65–99)
Potassium: 4.3 mmol/L (ref 3.5–5.2)
Sodium: 141 mmol/L (ref 134–144)

## 2019-10-29 ENCOUNTER — Telehealth: Payer: Self-pay | Admitting: Nurse Practitioner

## 2019-10-29 NOTE — Telephone Encounter (Signed)
Patient is calling and requesting a call back regarding getting a colostomy, please advise. CB is [phone]

## 2019-10-29 NOTE — Telephone Encounter (Signed)
Pt states a automatic system called her and told her it was time to schedule her colostomy and when she followed the prompts it sent her to our office. Pt states she is not ready to schedule this at this time due to her busy schedule and would not be able to set this up until after Jan. 2022. Pt states she will discuss at her next office visit.

## 2019-10-30 ENCOUNTER — Telehealth: Payer: Self-pay | Admitting: Nurse Practitioner

## 2019-10-30 NOTE — Telephone Encounter (Signed)
Patient is calling and requesting a call back regarding labs and medication, please advise. CB is [phone].

## 2019-10-31 NOTE — Telephone Encounter (Signed)
LVM for patient to return call. 

## 2019-11-02 NOTE — Telephone Encounter (Signed)
Pt has scheduled vv to discuss issues with Baldo Ash.

## 2019-11-02 NOTE — Telephone Encounter (Signed)
Patient is returning the call, please advise. CB is [phone]

## 2019-11-06 ENCOUNTER — Telehealth (INDEPENDENT_AMBULATORY_CARE_PROVIDER_SITE_OTHER): Payer: Federal, State, Local not specified - PPO | Admitting: Nurse Practitioner

## 2019-11-06 ENCOUNTER — Encounter: Payer: Self-pay | Admitting: Nurse Practitioner

## 2019-11-06 VITALS — BP 138/82 | HR 76 | Ht 63.0 in

## 2019-11-06 DIAGNOSIS — N939 Abnormal uterine and vaginal bleeding, unspecified: Secondary | ICD-10-CM

## 2019-11-06 DIAGNOSIS — L708 Other acne: Secondary | ICD-10-CM

## 2019-11-06 DIAGNOSIS — I152 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: Secondary | ICD-10-CM

## 2019-11-06 DIAGNOSIS — R600 Localized edema: Secondary | ICD-10-CM

## 2019-11-06 NOTE — Progress Notes (Signed)
Virtual Visit via Video Note  I connected with@ on 11/06/19 at 10:15 AM EDT by a video enabled telemedicine application and verified that I am speaking with the correct person using two identifiers.  Location: Patient:Home Provider: Office Participants: patient and provider  I discussed the limitations of evaluation and management by telemedicine and the availability of in person appointments. I also discussed with the patient that there may be a patient responsible charge related to this service. The patient expressed understanding and agreed to proceed.  UM:PNTIRWER about acne, vaginal bleeding and HTN medications.  History of Present Illness: Vaginal bleeding: Chronic, intermittent, recent episode lasted 09/12/2019-10/29/2019. Saturated 39maxi pads per day, no dizziness or pica or paresthesia. She started spironolactone after 09/18/2019. Last appt with GYN: Dr. Kennon Rounds 12/2018.  Advised to schedule another appt with GYN.  Acne: On face and upper back, onset 1months ago, worsening. Advised to try OTC acne product, if no improvement schedule appt with endocrinology.  Hypertension Stable BP and renal function with lisinopril and spironolactone Stable electrolytes She reports improved LE edema BP Readings from Last 3 Encounters:  11/06/19 138/82  10/12/19 110/64  09/12/19 130/82   Wt Readings from Last 3 Encounters:  10/12/19 296 lb 6.4 oz (134.4 kg)  09/12/19 (!) 300 lb 9.6 oz (136.4 kg)  03/09/19 298 lb 6.4 oz (135.4 kg)   BMP Latest Ref Rng & Units 10/25/2019 09/12/2019 12/20/2018  Glucose 65 - 99 mg/dL 89 87 81  BUN 6 - 24 mg/dL 11 8 9   Creatinine 0.57 - 1.00 mg/dL 0.84 0.88 0.82  BUN/Creat Ratio 9 - 23 13 9 11   Sodium 134 - 144 mmol/L 141 136 142  Potassium 3.5 - 5.2 mmol/L 4.3 4.0 4.2  Chloride 96 - 106 mmol/L 104 99 102  CO2 20 - 29 mmol/L 23 21 25   Calcium 8.7 - 10.2 mg/dL 9.4 9.6 9.8   Maintain current medications F/up in  74month.  Observations/Objective: Physical Exam Vitals reviewed.  Constitutional:      Appearance: She is obese.  Cardiovascular:     Pulses: Normal pulses.  Pulmonary:     Effort: Pulmonary effort is normal.  Neurological:     Mental Status: She is alert and oriented to person, place, and time.    Assessment and Plan: Carlia was seen today for follow-up.  Diagnoses and all orders for this visit:  Bilateral leg edema  Hypertension due to endocrine disorder  Other acne  Abnormal vaginal bleeding   Follow Up Instructions: See above She is to call office and schedule 57month f/up appt   I discussed the assessment and treatment plan with the patient. The patient was provided an opportunity to ask questions and all were answered. The patient agreed with the plan and demonstrated an understanding of the instructions.   The patient was advised to call back or seek an in-person evaluation if the symptoms worsen or if the condition fails to improve as anticipated.  Wilfred Lacy, NP

## 2019-11-06 NOTE — Assessment & Plan Note (Signed)
Stable BP and renal function with lisinopril and spironolactone Stable electrolytes She reports improved LE edema BP Readings from Last 3 Encounters:  11/06/19 138/82  10/12/19 110/64  09/12/19 130/82   Wt Readings from Last 3 Encounters:  10/12/19 296 lb 6.4 oz (134.4 kg)  09/12/19 (!) 300 lb 9.6 oz (136.4 kg)  03/09/19 298 lb 6.4 oz (135.4 kg)   BMP Latest Ref Rng & Units 10/25/2019 09/12/2019 12/20/2018  Glucose 65 - 99 mg/dL 89 87 81  BUN 6 - 24 mg/dL 11 8 9   Creatinine 0.57 - 1.00 mg/dL 0.84 0.88 0.82  BUN/Creat Ratio 9 - 23 13 9 11   Sodium 134 - 144 mmol/L 141 136 142  Potassium 3.5 - 5.2 mmol/L 4.3 4.0 4.2  Chloride 96 - 106 mmol/L 104 99 102  CO2 20 - 29 mmol/L 23 21 25   Calcium 8.7 - 10.2 mg/dL 9.4 9.6 9.8   Maintain current medications F/up in 31month.

## 2019-11-15 ENCOUNTER — Telehealth: Payer: Self-pay | Admitting: Nurse Practitioner

## 2019-11-15 NOTE — Telephone Encounter (Signed)
Patient is requesting a call back from the nurse. Would not give details, said she'd rather wait and speak to the nurse. Please give her a call back at [phone].

## 2019-11-19 NOTE — Telephone Encounter (Signed)
Spoke with patient and she states she has some information to tell but could not at the time because she was working with a Ship broker. States she will call the office in the morning.

## 2019-11-27 ENCOUNTER — Telehealth: Payer: Self-pay

## 2019-11-27 NOTE — Telephone Encounter (Signed)
She is to resume lisinopril and schedule an appt with her endocrinologist. She is to schedule f/up with as discussed during last video appt

## 2019-11-27 NOTE — Telephone Encounter (Signed)
Pt states she has been bleeding off and on since her appointment on 11/06/2019 and she has been experiencing a lot of pain in her lower back. Pt states she feels that the medication she was taking which was spironolactone and lisinopril.caused her urine to change color, caused her to have severe low back pain, and made her very fatigued. Pt states since then she stopped both medications. Pt states said she did start back on her bromocriptine because she has been on it for 20+ years and she feels like it is not a problem for her. Pt states she stopped taking all medications on 11/11/19 and on 10/12/21she started tooking vitamins and then the bromocriptine only. She had a lot of acne due to /the medication so that is another reason she stopped the medications. Pt just wanted to make you aware of this because her BP was stable but now it is starting to elevate again.

## 2019-11-27 NOTE — Telephone Encounter (Signed)
Patient notified and verbalized understanding. 

## 2019-12-18 ENCOUNTER — Telehealth: Payer: Self-pay | Admitting: Nurse Practitioner

## 2019-12-18 ENCOUNTER — Telehealth (INDEPENDENT_AMBULATORY_CARE_PROVIDER_SITE_OTHER): Payer: Federal, State, Local not specified - PPO | Admitting: Nurse Practitioner

## 2019-12-18 ENCOUNTER — Encounter: Payer: Self-pay | Admitting: Nurse Practitioner

## 2019-12-18 VITALS — BP 151/94 | HR 99 | Ht 63.0 in

## 2019-12-18 DIAGNOSIS — R600 Localized edema: Secondary | ICD-10-CM | POA: Diagnosis not present

## 2019-12-18 DIAGNOSIS — I152 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: Secondary | ICD-10-CM

## 2019-12-18 MED ORDER — LISINOPRIL 10 MG PO TABS: 10.0000 mg | ORAL_TABLET | Freq: Two times a day (BID) | ORAL | 5 refills | Status: DC

## 2019-12-18 MED ORDER — LISINOPRIL 20 MG PO TABS: 20.0000 mg | ORAL_TABLET | Freq: Every day | ORAL | 1 refills | Status: DC

## 2019-12-18 MED ORDER — FUROSEMIDE 20 MG PO TABS: 20.0000 mg | ORAL_TABLET | Freq: Every day | ORAL | 5 refills | Status: DC | PRN

## 2019-12-18 NOTE — Progress Notes (Signed)
Virtual Visit via Telephone Note  I connected with Sherry [name] on 12/18/19 at 11:30 AM EST by telephone and verified that I am speaking with the correct person using two identifiers.  Location: Patient:home Provider:office Participants: husband, patient, and provider.  I discussed the limitations, risks, security and privacy concerns of performing an evaluation and management service by telephone and the availability of in person appointments. I also discussed with the patient that there may be a patient responsible charge related to this service. The patient expressed understanding and agreed to proceed.  CC: LE edema and HTN f/up  History of Present Illness:  Bilateral leg edema Does not want to resume spironolactone due to side effects (vaginal bleeding and fatigue) Persistent LE edema and paresthesia. Sent furosemide to be used prn F/up in 2month  Hypertension Elevated BP with discontinuation of spironolactone. Does not want to resume medication due to side effects. BP Readings from Last 3 Encounters:  12/18/19 (!) 151/94  11/06/19 138/82  10/12/19 110/64   Increase lisinopril dose to 10mg  BID Use furosemide prn F/up in 53months  Observations/Objective: Alert and oriented x 4 Limited eval due to televisit  Assessment and Plan: Sherry [name] was seen today for follow-up.  Diagnoses and all orders for this visit:  Hypertension due to endocrine disorder -     Discontinue: lisinopril (ZESTRIL) 20 MG tablet; Take 1 tablet (20 mg total) by mouth daily. -     lisinopril (ZESTRIL) 10 MG tablet; Take 1 tablet (10 mg total) by mouth 2 (two) times daily.  Bilateral leg edema -     furosemide (LASIX) 20 MG tablet; Take 1 tablet (20 mg total) by mouth daily as needed for edema (daily x 3days then stop).   Follow Up Instructions: F/up in 5month (F2F) Make medication changes as discussed.  I discussed the assessment and treatment plan with the patient. The patient was  provided an opportunity to ask questions and all were answered. The patient agreed with the plan and demonstrated an understanding of the instructions.   The patient was advised to call back or seek an in-person evaluation if the symptoms worsen or if the condition fails to improve as anticipated.  I provided 30 minutes of non-face-to-face time during this encounter.  Wilfred Lacy, NP

## 2019-12-18 NOTE — Assessment & Plan Note (Signed)
Elevated BP with discontinuation of spironolactone. Does not want to resume medication due to side effects. BP Readings from Last 3 Encounters:  12/18/19 (!) 151/94  11/06/19 138/82  10/12/19 110/64   Increase lisinopril dose to 10mg  BID Use furosemide prn F/up in 85months

## 2019-12-18 NOTE — Telephone Encounter (Signed)
Varney Biles from [employer] calling with questions about furosemide (LASIX) 20 MG tablet, please call back at [phone] and just ask to speak to [facility].

## 2019-12-18 NOTE — Assessment & Plan Note (Signed)
Does not want to resume spironolactone due to side effects (vaginal bleeding and fatigue) Persistent LE edema and paresthesia. Sent furosemide to be used prn F/up in 105month

## 2019-12-19 NOTE — Telephone Encounter (Signed)
Spoke with pharmacy and clarified Rx instructions.

## 2020-01-08 DIAGNOSIS — M65311 Trigger thumb, right thumb: Secondary | ICD-10-CM | POA: Diagnosis not present

## 2020-01-08 DIAGNOSIS — M79644 Pain in right finger(s): Secondary | ICD-10-CM | POA: Diagnosis not present

## 2020-01-08 DIAGNOSIS — M79641 Pain in right hand: Secondary | ICD-10-CM | POA: Diagnosis not present

## 2020-01-21 ENCOUNTER — Telehealth: Payer: Self-pay | Admitting: Nurse Practitioner

## 2020-01-21 ENCOUNTER — Telehealth: Payer: Self-pay

## 2020-01-21 NOTE — Telephone Encounter (Signed)
Spoke to patient and advise that don't see where it has been prescribed by [facility] before.  She will call her endocrinologist to see about getting a refill.   Dm/cma

## 2020-01-21 NOTE — Telephone Encounter (Signed)
Patient calling to thank [place] for her help and to let her know that she had gotten medication issue straightened out.

## 2020-01-21 NOTE — Telephone Encounter (Signed)
Patient is calling to see if she can get a refill on her Bromocriptine. If approved, please send to [facility] in [place] and call her at [phone] to let her know that it has been sent in. She said that she's completely out of this medication.

## 2020-01-23 DIAGNOSIS — G4733 Obstructive sleep apnea (adult) (pediatric): Secondary | ICD-10-CM | POA: Diagnosis not present

## 2020-02-21 DIAGNOSIS — D443 Neoplasm of uncertain behavior of pituitary gland: Secondary | ICD-10-CM | POA: Diagnosis not present

## 2020-02-21 DIAGNOSIS — D352 Benign neoplasm of pituitary gland: Secondary | ICD-10-CM | POA: Diagnosis not present

## 2020-02-21 DIAGNOSIS — D353 Benign neoplasm of craniopharyngeal duct: Secondary | ICD-10-CM | POA: Diagnosis not present

## 2020-02-29 DIAGNOSIS — D443 Neoplasm of uncertain behavior of pituitary gland: Secondary | ICD-10-CM | POA: Diagnosis not present

## 2020-02-29 DIAGNOSIS — I10 Essential (primary) hypertension: Secondary | ICD-10-CM | POA: Diagnosis not present

## 2020-02-29 DIAGNOSIS — D352 Benign neoplasm of pituitary gland: Secondary | ICD-10-CM | POA: Diagnosis not present

## 2020-02-29 DIAGNOSIS — D353 Benign neoplasm of craniopharyngeal duct: Secondary | ICD-10-CM | POA: Diagnosis not present

## 2020-03-13 ENCOUNTER — Other Ambulatory Visit: Payer: Self-pay | Admitting: Nurse Practitioner

## 2020-03-13 DIAGNOSIS — L2082 Flexural eczema: Secondary | ICD-10-CM

## 2020-03-13 NOTE — Telephone Encounter (Signed)
Refill request for pending Rx last OV 10/23/19 last refill on cream August 2020. Per patient she does have some eczema flare ups and would like a refill. Okay to refill? Please advise

## 2020-04-01 ENCOUNTER — Other Ambulatory Visit: Payer: Self-pay | Admitting: Nurse Practitioner

## 2020-04-01 DIAGNOSIS — I152 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: Secondary | ICD-10-CM

## 2020-04-01 NOTE — Telephone Encounter (Signed)
Last fill 12/18/19  #60/5 Last VV 11/06/19

## 2020-04-08 ENCOUNTER — Other Ambulatory Visit: Payer: Self-pay

## 2020-04-09 ENCOUNTER — Ambulatory Visit: Payer: Federal, State, Local not specified - PPO | Admitting: Nurse Practitioner

## 2020-04-09 ENCOUNTER — Encounter: Payer: Self-pay | Admitting: Nurse Practitioner

## 2020-04-09 VITALS — BP 140/86 | HR 90 | Temp 97.7°F | Ht 63.0 in | Wt 282.0 lb

## 2020-04-09 DIAGNOSIS — N939 Abnormal uterine and vaginal bleeding, unspecified: Secondary | ICD-10-CM

## 2020-04-09 DIAGNOSIS — R739 Hyperglycemia, unspecified: Secondary | ICD-10-CM

## 2020-04-09 DIAGNOSIS — R600 Localized edema: Secondary | ICD-10-CM | POA: Diagnosis not present

## 2020-04-09 DIAGNOSIS — I152 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: Secondary | ICD-10-CM | POA: Diagnosis not present

## 2020-04-09 MED ORDER — LISINOPRIL 20 MG PO TABS: 20.0000 mg | ORAL_TABLET | Freq: Every day | ORAL | 3 refills | Status: DC

## 2020-04-09 NOTE — Patient Instructions (Addendum)
Go [facility] for blood draw  I increased lisinopril to 20mg  day.  Maintain other medications  Schedule appt with GYN: [phone]

## 2020-04-09 NOTE — Assessment & Plan Note (Signed)
Improving BP with furosemide and lisinopril BP Readings from Last 3 Encounters:  04/09/20 140/86  12/18/19 (!) 151/94  11/06/19 138/82   Increase lisinopril to 20mg  daily Maintain furosemide use as prn Repeat BMP F/up in 53month

## 2020-04-09 NOTE — Assessment & Plan Note (Signed)
Persistent vaginal bleeding No ABD/vaginal pain, no dizziness, no paresthesia, no pica Advised to schedule appt with GYN. Repeat CBC and ferritin

## 2020-04-09 NOTE — Assessment & Plan Note (Signed)
Improved with furosemide prn Encourage to use compression stocking and maintain daily exercise (15-71mins of brisk walking)

## 2020-04-09 NOTE — Progress Notes (Addendum)
Subjective:  Patient ID: Sherry [name], female    DOB: 1965/12/27  Age: 55 y.o. MRN: [id number]  CC: Follow-up (3 month f/u on HTN and prediabetes. Pt is fasting. 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 Repeat CBC and ferritin  Wt Readings from Last 3 Encounters:  04/09/20 282 lb (127.9 kg)  10/12/19 296 lb 6.4 oz (134.4 kg)  09/12/19 (!) 300 lb 9.6 oz (136.4 kg)   Reviewed past Medical, Social and Family history today.  Outpatient Medications Prior to Visit  Medication Sig Dispense Refill   Blood Pressure Monitoring (OMRON 5 SERIES BP MONITOR) DEVI      bromocriptine (PARLODEL) 2.5 MG tablet TAKE ONE (1) TABLET BY MOUTH ONCE DAILY     Cholecalciferol (VITAMIN D3) 5000 units TABS Take by mouth.     furosemide (LASIX) 20 MG tablet Take 1 tablet (20 mg total) by mouth daily as needed for edema (daily x 3days then stop). 30 tablet 5   Multiple Vitamins-Minerals (MULTIVITAMIN ADULT PO) Take by mouth.     Omega-3 Fatty Acids (FISH OIL CONCENTRATE) 300 MG CAPS Take 500 mg by mouth.     triamcinolone cream (KENALOG) 0.5 % APPLY 1 APPLICATION TOPICALLY TWICE DAILY 454 g 1   lisinopril (ZESTRIL) 10 MG tablet TAKE (1) TABLET BY MOUTH EVERY DAY 90 tablet 0   No facility-administered medications prior to visit.    ROS See HPI  Objective:  BP 140/86 (BP Location: Left Arm, Patient Position: Sitting, Cuff Size: Large)     Pulse 90    Temp 97.7 F (36.5 C) (Temporal)    Ht 5\' 3"  (1.6 m)    Wt 282 lb (127.9 kg)    SpO2 99%    BMI 49.95 kg/m   Physical Exam Vitals reviewed.  Constitutional:      Appearance: She is obese.  Cardiovascular:     Rate and Rhythm: Normal rate and regular rhythm.     Pulses: Normal pulses.     Heart sounds: Normal heart sounds.  Pulmonary:     Effort: Pulmonary effort is normal.     Breath sounds: Normal breath sounds.  Musculoskeletal:     Right lower leg: Edema present.     Left lower leg: Edema present.  Neurological:     Mental Status: She is alert and oriented to person, place, and time.    Assessment & Plan:  This visit occurred during the SARS-CoV-2 public health emergency.  Safety protocols were in place, including screening questions prior to the visit, additional usage of staff PPE, and extensive cleaning of exam room while observing appropriate contact time as indicated for disinfecting solutions.   Sherry [name] was seen today for follow-up.  Diagnoses and all orders for this visit:  Hypertension due to endocrine disorder -     Basic metabolic panel; Future -     lisinopril (ZESTRIL) 20 MG tablet; Take 1 tablet (20 mg total) by mouth daily.  Bilateral leg edema -  Basic metabolic panel; Future  Abnormal uterine bleeding -     Ferritin; Future -     CBC; Future  Hyperglycemia -     Basic metabolic panel; Future -     Hemoglobin A1c; Future    Problem List Items Addressed This Visit      Cardiovascular and Mediastinum   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 - Primary    Improving BP with furosemide and lisinopril BP Readings from Last 3 Encounters:  04/09/20 140/86  12/18/19 (!) 151/94  11/06/19 138/82   Increase lisinopril to 20mg  daily Maintain furosemide use as prn Repeat BMP F/up in 29month       Relevant Medications   lisinopril (ZESTRIL) 20 MG tablet     Genitourinary   Abnormal uterine bleeding    Persistent vaginal  bleeding No ABD/vaginal pain, no dizziness, no paresthesia, no pica Advised to schedule appt with GYN. Repeat CBC and ferritin      Relevant Orders   Ferritin   CBC     Other   Bilateral leg edema    Improved with furosemide prn Encourage to use compression stocking and maintain daily exercise (15-36mins of brisk walking)      Relevant Orders   Basic metabolic panel    Other Visit Diagnoses    Hyperglycemia       Relevant Orders   Basic metabolic panel   Hemoglobin A1c      Follow-up: Return in about 4 weeks (around 05/07/2020) for HTN (video appt).  Wilfred Lacy, NP

## 2020-04-11 ENCOUNTER — Other Ambulatory Visit: Payer: Self-pay | Admitting: Nurse Practitioner

## 2020-04-11 DIAGNOSIS — R600 Localized edema: Secondary | ICD-10-CM | POA: Diagnosis not present

## 2020-04-11 DIAGNOSIS — I152 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: Secondary | ICD-10-CM | POA: Diagnosis not present

## 2020-04-11 DIAGNOSIS — N939 Abnormal uterine and vaginal bleeding, unspecified: Secondary | ICD-10-CM | POA: Diagnosis not present

## 2020-04-11 DIAGNOSIS — R739 Hyperglycemia, unspecified: Secondary | ICD-10-CM | POA: Diagnosis not present

## 2020-04-12 LAB — HEMOGLOBIN A1C
Est. average glucose Bld gHb Est-mCnc: 134 mg/dL
Hgb A1c MFr Bld: 6.3 % — ABNORMAL HIGH (ref 4.8–5.6)

## 2020-04-12 LAB — BASIC METABOLIC PANEL
BUN/Creatinine Ratio: 10 (ref 9–23)
BUN: 10 mg/dL (ref 6–24)
CO2: 19 mmol/L — ABNORMAL LOW (ref 20–29)
Calcium: 9.2 mg/dL (ref 8.7–10.2)
Chloride: 109 mmol/L — ABNORMAL HIGH (ref 96–106)
Creatinine, Ser: 0.98 mg/dL (ref 0.57–1.00)
Glucose: 89 mg/dL (ref 65–99)
Potassium: 4 mmol/L (ref 3.5–5.2)
Sodium: 143 mmol/L (ref 134–144)
eGFR: 68 mL/min/{1.73_m2} (ref >59)

## 2020-04-12 LAB — CBC
Hematocrit: 38.6 % (ref 34.0–46.6)
Hemoglobin: 13 g/dL (ref 11.1–15.9)
MCH: 30 pg (ref 26.6–33.0)
MCHC: 33.7 g/dL (ref 31.5–35.7)
MCV: 89 fL (ref 79–97)
Platelets: 304 10*3/uL (ref 150–450)
RBC: 4.34 x10E6/uL (ref 3.77–5.28)
RDW: 13.7 % (ref 11.7–15.4)
WBC: 11.2 10*3/uL — ABNORMAL HIGH (ref 3.4–10.8)

## 2020-04-12 LAB — FERRITIN: Ferritin: 64 ng/mL (ref 15–150)

## 2020-04-22 DIAGNOSIS — G4733 Obstructive sleep apnea (adult) (pediatric): Secondary | ICD-10-CM | POA: Diagnosis not present

## 2020-05-08 ENCOUNTER — Encounter: Payer: Self-pay | Admitting: Nurse Practitioner

## 2020-05-08 ENCOUNTER — Telehealth (INDEPENDENT_AMBULATORY_CARE_PROVIDER_SITE_OTHER): Payer: Federal, State, Local not specified - PPO | Admitting: Nurse Practitioner

## 2020-05-08 VITALS — BP 142/91 | HR 74 | Ht 63.0 in | Wt 276.0 lb

## 2020-05-08 DIAGNOSIS — I152 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: Secondary | ICD-10-CM

## 2020-05-08 DIAGNOSIS — R600 Localized edema: Secondary | ICD-10-CM

## 2020-05-08 MED ORDER — FUROSEMIDE 20 MG PO TABS: 20.0000 mg | ORAL_TABLET | ORAL | 1 refills | Status: DC

## 2020-05-08 NOTE — Progress Notes (Signed)
Virtual Visit via Video Note  I connected with@ on 05/08/20 at 11:30 AM EDT by a video enabled telemedicine application and verified that I am speaking with the correct person using two identifiers.  Location: Patient:Home Provider: Office Participants: patient and provider  I discussed the limitations of evaluation and management by telemedicine and the availability of in person appointments. I also discussed with the patient that there may be a patient responsible charge related to this service. The patient expressed understanding and agreed to proceed.  CC:4 week f/u on HTN. Pt has been checking BP at home and this morning it was 142/91. Pt states since increasing the lisinopril she has noticed she has been having low back pain and change in the color in her urine.   History of Present Illness: 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 Medication discrepancy: she was taking 20mg  of lisinopril BID instead of 20mg  daiy. She does not check BP as previously discussed. Advised to make dose adjustment: 20mg  daily and start BP check 3x/week in AM. F/up in office in 61month  Observations/Objective: Physical Exam Cardiovascular:     Rate and Rhythm: Normal rate.     Pulses: Normal pulses.  Pulmonary:     Effort: Pulmonary effort is normal.  Neurological:     Mental Status: She is alert and oriented to person, place, and time.    Assessment and Plan: Aaron was seen today for follow-up.  Diagnoses and all orders for this visit:  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  Bilateral leg edema -     furosemide (LASIX) 20 MG tablet; Take 1 tablet (20 mg total) by mouth 3 (three) times a week.   Follow Up Instructions: See avs   I discussed the assessment and treatment plan with the patient. The patient was provided an opportunity to ask questions and all were answered. The patient agreed with the plan and demonstrated an understanding of the instructions.   The patient  was advised to call back or seek an in-person evaluation if the symptoms worsen or if the condition fails to improve as anticipated.  Wilfred Lacy, NP

## 2020-05-08 NOTE — Assessment & Plan Note (Signed)
Medication discrepancy: she was taking 20mg  of lisinopril BID instead of 20mg  daiy. She does not check BP as previously discussed. Advised to make dose adjustment: 20mg  daily and start BP check 3x/week in AM. F/up in office in 61month

## 2020-05-08 NOTE — Patient Instructions (Signed)
Maintain current medications. F/up in 41month Monitor BP 3x/week in AM and record. Bring readings to next OV

## 2020-06-09 ENCOUNTER — Telehealth: Payer: Self-pay | Admitting: Nurse Practitioner

## 2020-06-09 NOTE — Telephone Encounter (Signed)
Pt has cancelled her 06/10/20. She is wondering if she should reschedule before or after her upcoming appointment with [name], MD on 07/10/20. Please advise.

## 2020-06-10 ENCOUNTER — Ambulatory Visit: Payer: Federal, State, Local not specified - PPO | Admitting: Nurse Practitioner

## 2020-06-12 NOTE — Telephone Encounter (Signed)
One appt does not take precedence over the other. She needs to schedule both appts

## 2020-06-13 DIAGNOSIS — S92325A Nondisplaced fracture of second metatarsal bone, left foot, initial encounter for closed fracture: Secondary | ICD-10-CM | POA: Diagnosis not present

## 2020-06-13 NOTE — Telephone Encounter (Signed)
Patient notified and verbalized understanding. Appointment scheduled. 

## 2020-06-25 DIAGNOSIS — S92325A Nondisplaced fracture of second metatarsal bone, left foot, initial encounter for closed fracture: Secondary | ICD-10-CM | POA: Diagnosis not present

## 2020-07-01 ENCOUNTER — Ambulatory Visit: Payer: Federal, State, Local not specified - PPO | Admitting: Nurse Practitioner

## 2020-07-08 ENCOUNTER — Other Ambulatory Visit: Payer: Self-pay

## 2020-07-08 ENCOUNTER — Ambulatory Visit: Payer: Federal, State, Local not specified - PPO | Admitting: Nurse Practitioner

## 2020-07-08 ENCOUNTER — Encounter: Payer: Self-pay | Admitting: Nurse Practitioner

## 2020-07-08 VITALS — BP 138/88 | HR 82 | Temp 98.6°F | Ht 63.0 in | Wt 270.0 lb

## 2020-07-08 DIAGNOSIS — G8929 Other chronic pain: Secondary | ICD-10-CM | POA: Diagnosis not present

## 2020-07-08 DIAGNOSIS — I152 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: Secondary | ICD-10-CM | POA: Diagnosis not present

## 2020-07-08 DIAGNOSIS — R8290 Unspecified abnormal findings in urine: Secondary | ICD-10-CM | POA: Diagnosis not present

## 2020-07-08 DIAGNOSIS — M545 Low back pain, unspecified: Secondary | ICD-10-CM | POA: Diagnosis not present

## 2020-07-08 DIAGNOSIS — R3121 Asymptomatic microscopic hematuria: Secondary | ICD-10-CM

## 2020-07-08 DIAGNOSIS — L2082 Flexural eczema: Secondary | ICD-10-CM

## 2020-07-08 DIAGNOSIS — R600 Localized edema: Secondary | ICD-10-CM

## 2020-07-08 LAB — POCT URINALYSIS DIPSTICK
Bilirubin, UA: NEGATIVE
Blood, UA: 3
Glucose, UA: NEGATIVE
Ketones, UA: NEGATIVE
Leukocytes, UA: NEGATIVE
Nitrite, UA: NEGATIVE
Protein, UA: NEGATIVE
Spec Grav, UA: 1.025 (ref 1.010–1.025)
Urobilinogen, UA: NEGATIVE E.U./dL — AB
pH, UA: 6 (ref 5.0–8.0)

## 2020-07-08 MED ORDER — TRIAMCINOLONE ACETONIDE 0.5 % EX CREA: TOPICAL_CREAM | CUTANEOUS | 1 refills | Status: DC

## 2020-07-08 NOTE — Assessment & Plan Note (Addendum)
She discontinued lisinopril 04/19/-05/31, thinking it was cause of back pain. Increase in BP 130s/80s to 160s/100s with discontinuation of lisinopril. She resumed medication 1week ago, BP improved to 130s/90s. BP Readings from Last 3 Encounters:  07/08/20 138/88  05/08/20 (!) 142/91  04/09/20 140/86   Advised to avoid medication inconsistency.  POCT urinalysis: negative protein, positive hematuria (secondary to vaginal bleeding, sent urine for culture as well) Repeat BMP.

## 2020-07-08 NOTE — Assessment & Plan Note (Signed)
Triamcinolone refill sent

## 2020-07-08 NOTE — Assessment & Plan Note (Signed)
Stable with furosemide 3x/week. Wt Readings from Last 3 Encounters:  07/08/20 270 lb (122.5 kg)  05/08/20 276 lb (125.2 kg)  04/09/20 282 lb (127.9 kg)

## 2020-07-08 NOTE — Patient Instructions (Addendum)
You have uterine fibroids Pathology report from Dr. Kennon Rounds was normal Maintain upcoming appt.  Go to lab for urinalysis Go [facility] for repeat BMP Will need to get lumbar spine x-ray once you are able to stand with support.  Maintain lisinopril and furosemide prescription.

## 2020-07-08 NOTE — Progress Notes (Signed)
Subjective:  Patient ID: Sherry [name], female    DOB: 1965/03/10  Age: 55 y.o. MRN: [id number]  CC: Follow-up (Follow up Hypertension (Avg home blood pressure reading 165/102 - 126/92)  HPI  Accompanied by husband.  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 Increase in BP 130s/80s to 160s/100s with discontinuation of lisinopril. She resumed medication 1week ago, BP improved to 130s/90s. BP Readings from Last 3 Encounters:  07/08/20 138/88  05/08/20 (!) 142/91  04/09/20 140/86   Advised to avoid medication inconsistency.  POCT urinalysis: negative protein, positive hematuria (secondary to vaginal bleeding, sent urine for culture as well) Repeat BMP.   Bilateral leg edema Stable with furosemide 3x/week. Wt Readings from Last 3 Encounters:  07/08/20 270 lb (122.5 kg)  05/08/20 276 lb (125.2 kg)  04/09/20 282 lb (127.9 kg)    Flexural eczema Triamcinolone refill sent  BP Readings from Last 3 Encounters:  07/08/20 138/88  05/08/20 (!) 142/91  04/09/20 140/86   Reviewed past Medical, Social and Family history today.  Outpatient Medications Prior to Visit  Medication Sig Dispense Refill  . Blood Pressure Monitoring (OMRON 5 SERIES BP MONITOR) DEVI     . bromocriptine (PARLODEL) 2.5 MG tablet TAKE ONE (1) TABLET BY MOUTH ONCE DAILY    . Cholecalciferol (VITAMIN D3) 5000 units TABS Take by mouth.    . furosemide (LASIX) 20 MG tablet Take 1 tablet (20 mg total) by mouth 3 (three) times a week. 36 tablet 1  . lisinopril (ZESTRIL) 20 MG tablet Take 1 tablet (20 mg total) by mouth daily. 90 tablet 3  . Multiple Vitamins-Minerals (MULTIVITAMIN ADULT PO) Take by mouth.    . Omega-3 Fatty Acids (FISH OIL CONCENTRATE) 300 MG CAPS Take 500 mg by mouth.    . triamcinolone cream (KENALOG) 0.5 % APPLY 1 APPLICATION TOPICALLY TWICE DAILY 454 g 1   No facility-administered medications prior to visit.     ROS See HPI  Objective:  BP 138/88 Comment: no blood pressure meds taken today.  Pulse 82   Temp 98.6 F (37 C)   Ht 5\' 3"  (1.6 m)   Wt 270 lb (122.5 kg) Comment: per pt (in w/c with surgical boot  SpO2 98%   BMI 47.83 kg/m   Physical Exam Vitals reviewed.  Cardiovascular:     Rate and Rhythm: Normal rate.     Pulses: Normal pulses.  Pulmonary:     Effort: Pulmonary effort is normal.  Musculoskeletal:     Right lower leg: No edema.     Left lower leg: No edema.  Neurological:     Mental Status: She is alert and oriented to person, place, and time.    Assessment & Plan:  This visit occurred during the SARS-CoV-2 public health emergency.  Safety protocols were in place, including screening questions prior to the visit, additional usage of staff PPE, and extensive cleaning of exam room while observing appropriate contact time as indicated for disinfecting solutions.   Sherry [name] was seen today for follow-up.  Diagnoses and all orders for this visit:  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 -     Cancel: Basic metabolic panel -     Basic metabolic panel; Future  Flexural eczema -     triamcinolone cream (KENALOG) 0.5 %; APPLY 1 APPLICATION TOPICALLY TWICE DAILY  Chronic bilateral low back pain without sciatica -     Cancel: Urinalysis w microscopic + reflex cultur -  DG Lumbar Spine Complete; Future -     POCT urinalysis dipstick  Asymptomatic microscopic hematuria -     POCT urinalysis dipstick -     Urine Culture  Bilateral leg edema  Will need to get lumbar spine x-ray once you are able to stand with support.  Problem List Items Addressed This Visit      Cardiovascular and Mediastinum   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 - Primary    She discontinued lisinopril 04/19/-05/31, thinking it was cause of back pain. Increase in BP 130s/80s to 160s/100s with discontinuation of lisinopril. She resumed medication 1week ago, BP improved to  130s/90s. BP Readings from Last 3 Encounters:  07/08/20 138/88  05/08/20 (!) 142/91  04/09/20 140/86   Advised to avoid medication inconsistency.  POCT urinalysis: negative protein, positive hematuria (secondary to vaginal bleeding, sent urine for culture as well) Repeat BMP.       Relevant Orders   Basic metabolic panel     Musculoskeletal and Integument   Flexural eczema    Triamcinolone refill sent      Relevant Medications   triamcinolone cream (KENALOG) 0.5 %     Other   Bilateral leg edema    Stable with furosemide 3x/week. Wt Readings from Last 3 Encounters:  07/08/20 270 lb (122.5 kg)  05/08/20 276 lb (125.2 kg)  04/09/20 282 lb (127.9 kg)        Chronic bilateral low back pain without sciatica   Relevant Orders   DG Lumbar Spine Complete   POCT urinalysis dipstick (Completed)    Other Visit Diagnoses    Asymptomatic microscopic hematuria       Relevant Orders   POCT urinalysis dipstick (Completed)   Urine Culture      Follow-up: Return in about 3 months (around 10/08/2020) for HTN, hyperglycemia (fasting).  Wilfred Lacy, NP

## 2020-07-09 DIAGNOSIS — S92325A Nondisplaced fracture of second metatarsal bone, left foot, initial encounter for closed fracture: Secondary | ICD-10-CM | POA: Diagnosis not present

## 2020-07-09 LAB — URINE CULTURE
MICRO NUMBER:: 11978568
SPECIMEN QUALITY:: ADEQUATE

## 2020-07-10 ENCOUNTER — Other Ambulatory Visit: Payer: Self-pay

## 2020-07-10 ENCOUNTER — Encounter: Payer: Self-pay | Admitting: Family Medicine

## 2020-07-10 ENCOUNTER — Ambulatory Visit (INDEPENDENT_AMBULATORY_CARE_PROVIDER_SITE_OTHER): Payer: Federal, State, Local not specified - PPO | Admitting: Family Medicine

## 2020-07-10 VITALS — BP 141/98 | HR 84 | Ht 63.0 in | Wt 272.0 lb

## 2020-07-10 DIAGNOSIS — N939 Abnormal uterine and vaginal bleeding, unspecified: Secondary | ICD-10-CM

## 2020-07-10 NOTE — Assessment & Plan Note (Signed)
Previously seen and worked up and she had declined all iontervention. Will proceed with same. Normal TFTs a few months ago. No evidence of menopause as yet. Last FSH WNL in 2020. She had late menarche at age 55. Will check pelvic sonogram, repeat pap and EMB. She declined today (recent broken foot and could not navigate the pelvic table). She will return for these in August once recovered. Again discussed treatment options to include: oral progesterone, Prog containing IUD, endometrial ablation, hysterectomy. Discussed R/B/A of all. She will consider.

## 2020-07-10 NOTE — Patient Instructions (Signed)
Menorrhagia Menorrhagia is a form of abnormal uterine bleeding in which menstrual periods are heavy or last longer than normal. With menorrhagia, the periods may cause enough blood loss and cramping that a woman becomes unable to take part in her usual activities. What are the causes? Common causes of this condition include:  Polyps or fibroids. These are noncancerous growths in the uterus.  An imbalance of the hormones estrogen and progesterone.  Anovulation, which occurs when one of the ovaries does not release an egg during one or more months.  A problem with the thyroid gland (hypothyroidism).  Side effects of having an intrauterine device (IUD).  Side effects of some medicines, such as NSAIDs or blood thinners.  A bleeding disorder that stops the blood from clotting normally. In some cases, the cause of this condition is not known. What increases the risk? You are more likely to develop this condition if you have cancer of the uterus. What are the signs or symptoms? Symptoms of this condition include:  Routinely having to change your pad or tampon every 1-2 hours because it is soaked.  Needing to use pads and tampons at the same time because of heavy bleeding.  Needing to wake up to change your pads or tampons during the night.  Passing blood clots larger than 1 inch (2.5 cm) in size.  Having bleeding that lasts for more than 7 days.  Having symptoms of low iron levels (anemia), such as tiredness (fatigue) or shortness of breath. How is this diagnosed? This condition may be diagnosed based on:  A physical exam.  Your symptoms and menstrual history.  Tests, such as: ? Blood tests to check if you are pregnant or if you have hormonal changes, a bleeding or thyroid disorder, anemia, or other problems. ? Pap test to check for cancerous changes, infections, or inflammation. ? Endometrial biopsy. This test involves removing a tissue sample from the lining of the uterus  (endometrium) to be examined under a microscope. ? Pelvic ultrasound. This test uses sound waves to create images of your uterus, ovaries, and vagina. The images can show if you have fibroids or other growths. ? Hysteroscopy. For this test, a thin, flexible tube with a light on the end (hysteroscope) is used to look inside your uterus. How is this treated? Treatment may not be needed for this condition. If it is needed, the best treatment for you will depend on:  Whether you need to prevent pregnancy.  Your desire to have children in the future.  The cause and severity of your bleeding.  Your personal preference. Medicine Medicines are the first step in treatment. You may be treated with:  Hormonal birth control methods. These treatments reduce bleeding during your menstrual period. They include: ? Birth control pills. ? Skin patch. ? Vaginal ring. ? Shots (injections) that you get every 3 months. ? Hormonal IUD. ? Implants that go under the skin.  Medicines that thicken the blood and slow bleeding.  Medicines that reduce swelling, such as ibuprofen.  Medicines that contain an artificial (synthetic) hormone called progestin.  Medicines that make the ovaries stop working for a short time.  Iron supplements to treat anemia.   Surgery If medicines do not work, surgery may be done. Surgical options may include:  Dilation and curettage (D&C). In this procedure, your health care provider opens the lowest part of the uterus (cervix) and then scrapes or suctions tissue from the endometrium. This reduces menstrual bleeding.  Operative hysteroscopy. In this procedure,   a hysteroscope is used to view your uterus and help remove polyps that may be causing heavy periods.  Endometrial ablation. This is when various techniques are used to permanently destroy your entire endometrium. After endometrial ablation, most women have little or no menstrual flow. This procedure reduces your ability to  become pregnant.  Endometrial resection. In this procedure, an electrosurgical wire loop is used to remove the endometrium. This procedure reduces your ability to become pregnant.  Hysterectomy. This is surgical removal of your uterus. This is a permanent procedure that stops menstrual periods. Pregnancy is not possible after a hysterectomy. Follow these instructions at home: Medicines  Take over-the-counter and prescription medicines only as told by your health care provider. This includes iron pills.  Do not change or switch medicines without asking your health care provider.  Do not take aspirin or medicines that contain aspirin 1 week before or during your menstrual period. Aspirin may make bleeding worse. Managing constipation Your iron pills may cause constipation. If you are taking prescription iron supplements, you may need to take these actions to prevent or treat constipation:  Drink enough fluid to keep your urine pale yellow.  Take over-the-counter or prescription medicines.  Eat foods that are high in fiber, such as beans, whole grains, and fresh fruits and vegetables.  Limit foods that are high in fat and processed sugars, such as fried or sweet foods. General instructions  If you need to change your sanitary pad or tampon more than once every 2 hours, limit your activity until the bleeding stops.  Eat well-balanced meals, including foods that are high in iron. Foods that have a lot of iron include leafy green vegetables, meat, liver, eggs, and whole-grain breads and cereals.  Do not try to lose weight until the abnormal bleeding has stopped and your blood iron level is back to normal. If you need to lose weight, work with your health care provider to lose weight safely.  Keep all follow-up visits. This is important. Contact a health care provider if:  You soak through a pad or tampon every 1 or 2 hours, and this happens every time you have a period.  You need to use  pads and tampons at the same time because you are bleeding so much.  You have nausea, vomiting, diarrhea, or other problems related to medicines you are taking. Get help right away if:  You soak through more than a pad or tampon in 1 hour.  You pass clots bigger than 1 inch (2.5 cm) wide.  You feel short of breath.  You feel like your heart is beating too fast.  You feel dizzy or you faint.  You feel very weak or tired. Summary  Menorrhagia is a form of abnormal uterine bleeding in which menstrual periods are heavy or last longer than normal.  Treatment may not be needed for this condition. If it is needed, it may include medicines or procedures.  Take over-the-counter and prescription medicines only as told by your health care provider. This includes iron pills.  Get help right away if you have heavy bleeding that soaks through more than a pad or tampon in 1 hour, you pass large clots, or you feel dizzy, short of breath, or very weak or tired. This information is not intended to replace advice given to you by your health care provider. Make sure you discuss any questions you have with your health care provider. Document Revised: 10/02/2019 Document Reviewed: 10/02/2019 Elsevier Patient Education  2021   Elsevier Inc.  

## 2020-07-10 NOTE — Progress Notes (Signed)
   Subjective:    Patient ID: Sherry [name] is a 55 y.o. female presenting with vaginal bleeding  on 07/10/2020  HPI: [id number] Here for f/u of bleeding. She had long h/o irregular cycles, until started bromocriptine for pituitary tumor and then developed regular cycles. Now with bleeding every day x 1 year. Seen in 2020 and noted to have this bleeding with thickened lining. Then had endometrial sampling and pap which were normal. At that time, she was presented with options for treatment and she declined these and wanted to trial lifestyle interventions only.  Eventually stopped having bleeding but then changed with starting new medicine spironolactone. Reports bleeding has continued since then. She has since discontinued the medicine. She has not been anemic and normal ferritin recently. She is referred back for further evaluation and assessment.   Review of Systems  Constitutional:  Negative for chills and fever.  Respiratory:  Negative for shortness of breath.   Cardiovascular:  Negative for chest pain.  Gastrointestinal:  Negative for abdominal pain, nausea and vomiting.  Genitourinary:  Negative for dysuria.  Skin:  Negative for rash.     Objective:    BP (!) 141/98   Pulse 84   Ht 5\' 3"  (1.6 m)   Wt 272 lb (123.4 kg)   BMI 48.18 kg/m  Physical Exam Constitutional:      General: She is not in acute distress.    Appearance: She is well-developed.  HENT:     Head: Normocephalic and atraumatic.  Eyes:     General: No scleral icterus. Cardiovascular:     Rate and Rhythm: Normal rate.  Pulmonary:     Effort: Pulmonary effort is normal.  Abdominal:     Palpations: Abdomen is soft.  Musculoskeletal:     Cervical back: Neck supple.  Skin:    General: Skin is warm and dry.  Neurological:     Mental Status: She is alert and oriented to person, place, and time.        Assessment & Plan:   Problem List Items Addressed This Visit       Unprioritized   Abnormal uterine  bleeding - Primary    Previously seen and worked up and she had declined all iontervention. Will proceed with same. Normal TFTs a few months ago. No evidence of menopause as yet. Last FSH WNL in 2020. She had late menarche at age 36. Will check pelvic sonogram, repeat pap and EMB. She declined today (recent broken foot and could not navigate the pelvic table). She will return for these in August once recovered. Again discussed treatment options to include: oral progesterone, Prog containing IUD, endometrial ablation, hysterectomy. Discussed R/B/A of all. She will consider.       Relevant Orders   US PELVIC COMPLETE WITH TRANSVAGINAL    Total time in review of prior notes, pathology (EMB), labs (Hgb, Ferritin, FSH, TSH), history taking, review with patient, exam, note writing, discussion of options (see problem list), plan for next steps, alternatives and risks of treatment: 73 minutes.  Return in about 2 months (around 09/09/2020) for a CPE + endometrial biopsy.  [name] 07/10/2020 10:14 AM

## 2020-08-07 DIAGNOSIS — G4733 Obstructive sleep apnea (adult) (pediatric): Secondary | ICD-10-CM | POA: Diagnosis not present

## 2020-08-13 DIAGNOSIS — S62014A Nondisplaced fracture of distal pole of navicular [scaphoid] bone of right wrist, initial encounter for closed fracture: Secondary | ICD-10-CM | POA: Diagnosis not present

## 2020-08-13 DIAGNOSIS — S92302A Fracture of unspecified metatarsal bone(s), left foot, initial encounter for closed fracture: Secondary | ICD-10-CM | POA: Diagnosis not present

## 2020-09-05 ENCOUNTER — Ambulatory Visit: Payer: Federal, State, Local not specified - PPO

## 2020-09-16 ENCOUNTER — Other Ambulatory Visit: Payer: Federal, State, Local not specified - PPO | Admitting: Obstetrics and Gynecology

## 2020-09-17 DIAGNOSIS — S92302A Fracture of unspecified metatarsal bone(s), left foot, initial encounter for closed fracture: Secondary | ICD-10-CM | POA: Diagnosis not present

## 2020-09-18 ENCOUNTER — Ambulatory Visit: Payer: Federal, State, Local not specified - PPO | Admitting: Family Medicine

## 2020-09-25 ENCOUNTER — Telehealth: Payer: Self-pay | Admitting: Nurse Practitioner

## 2020-09-25 NOTE — Telephone Encounter (Signed)
Pt wanted to let Sherry [name] know she has not forgotten about the blood work, it has just been difficult for her to get it done. WM

## 2020-10-01 ENCOUNTER — Telehealth: Payer: Self-pay | Admitting: Nurse Practitioner

## 2020-10-01 DIAGNOSIS — Z1211 Encounter for screening for malignant neoplasm of colon: Secondary | ICD-10-CM

## 2020-10-01 NOTE — Telephone Encounter (Signed)
[place] is calling in regards to the pt. She is needing and wanting a colonoscopy. If she needs to come in for an appointment, she will or could an order be placed for her. Please advise pt

## 2020-10-09 ENCOUNTER — Ambulatory Visit
Admission: RE | Admit: 2020-10-09 | Discharge: 2020-10-09 | Disposition: A | Discharge status: Home / Self Care | Payer: Federal, State, Local not specified - PPO | Source: Ambulatory Visit | Attending: Family Medicine | Admitting: Family Medicine

## 2020-10-09 ENCOUNTER — Other Ambulatory Visit: Payer: Self-pay

## 2020-10-09 DIAGNOSIS — N939 Abnormal uterine and vaginal bleeding, unspecified: Secondary | ICD-10-CM

## 2020-10-09 DIAGNOSIS — R9389 Abnormal findings on diagnostic imaging of other specified body structures: Secondary | ICD-10-CM | POA: Diagnosis not present

## 2020-10-09 DIAGNOSIS — D259 Leiomyoma of uterus, unspecified: Secondary | ICD-10-CM | POA: Diagnosis not present

## 2020-10-09 DIAGNOSIS — N921 Excessive and frequent menstruation with irregular cycle: Secondary | ICD-10-CM | POA: Diagnosis not present

## 2020-10-21 ENCOUNTER — Ambulatory Visit (INDEPENDENT_AMBULATORY_CARE_PROVIDER_SITE_OTHER): Payer: Federal, State, Local not specified - PPO | Admitting: Obstetrics and Gynecology

## 2020-10-21 ENCOUNTER — Other Ambulatory Visit: Payer: Self-pay | Admitting: Obstetrics and Gynecology

## 2020-10-21 ENCOUNTER — Encounter: Payer: Self-pay | Admitting: Obstetrics and Gynecology

## 2020-10-21 ENCOUNTER — Other Ambulatory Visit: Payer: Self-pay | Admitting: Nurse Practitioner

## 2020-10-21 ENCOUNTER — Telehealth: Payer: Self-pay | Admitting: Nurse Practitioner

## 2020-10-21 ENCOUNTER — Other Ambulatory Visit (HOSPITAL_COMMUNITY)
Admission: RE | Admit: 2020-10-21 | Discharge: 2020-10-21 | Disposition: A | Discharge status: Home / Self Care | Payer: Federal, State, Local not specified - PPO | Source: Ambulatory Visit | Attending: Obstetrics and Gynecology | Admitting: Obstetrics and Gynecology

## 2020-10-21 ENCOUNTER — Other Ambulatory Visit: Payer: Self-pay

## 2020-10-21 VITALS — BP 150/96 | HR 76 | Ht 63.0 in | Wt 310.0 lb

## 2020-10-21 DIAGNOSIS — N939 Abnormal uterine and vaginal bleeding, unspecified: Secondary | ICD-10-CM

## 2020-10-21 DIAGNOSIS — I152 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: Secondary | ICD-10-CM | POA: Diagnosis not present

## 2020-10-21 DIAGNOSIS — Z1231 Encounter for screening mammogram for malignant neoplasm of breast: Secondary | ICD-10-CM

## 2020-10-21 DIAGNOSIS — N6489 Other specified disorders of breast: Secondary | ICD-10-CM

## 2020-10-21 DIAGNOSIS — B373 Candidiasis of vulva and vagina: Secondary | ICD-10-CM

## 2020-10-21 DIAGNOSIS — B3731 Acute candidiasis of vulva and vagina: Secondary | ICD-10-CM

## 2020-10-21 MED ORDER — METRONIDAZOLE 500 MG PO TABS: 500.0000 mg | ORAL_TABLET | Freq: Two times a day (BID) | ORAL | 0 refills | Status: AC

## 2020-10-21 MED ORDER — FLUCONAZOLE 150 MG PO TABS: 150.0000 mg | ORAL_TABLET | ORAL | 0 refills | Status: AC

## 2020-10-21 NOTE — Progress Notes (Signed)
Obstetrics and Gynecology Visit Return Patient Evaluation  Appointment Date: 10/21/2020  Primary Care Provider: N[place], Nicholson for [facility]  Chief Complaint: endometrial biopsy  History of Present Illness:  Sherry [name] is a 55 y.o. here for EMBx. Pt seen by Dr. Kennon Rounds in June for AUB and pap and embx recommended but pt with healing LE injury so pt declined until healed. U/s ordered at that visit (see below)  Interval History: Since that time, she states that she hasn't had any bleeding since that last visit.   Review of Systems: as noted in the History of Present Illness.   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 Current Outpatient Medications  Medication Sig Dispense Refill   bromocriptine (PARLODEL) 2.5 MG tablet TAKE ONE (1) TABLET BY MOUTH ONCE DAILY     Cholecalciferol (VITAMIN D3) 5000 units TABS Take by mouth.     fluconazole (DIFLUCAN) 150 MG tablet Take 1 tablet (150 mg total) by mouth every 3 (three) days for 2 doses. 2 tablet 0   furosemide (LASIX) 20 MG tablet Take 1 tablet (20 mg total) by mouth 3 (three) times a week. 36 tablet 1   lisinopril (ZESTRIL) 20 MG tablet  Take 1 tablet (20 mg total) by mouth daily. 90 tablet 3   metroNIDAZOLE (FLAGYL) 500 MG tablet Take 1 tablet (500 mg total) by mouth 2 (two) times daily for 7 days. 14 tablet 0   Multiple Vitamins-Minerals (MULTIVITAMIN ADULT PO) Take by mouth.     Omega-3 Fatty Acids (FISH OIL CONCENTRATE) 300 MG CAPS Take 500 mg by mouth.     Blood Pressure Monitoring (OMRON 5 SERIES BP MONITOR) DEVI      triamcinolone cream (KENALOG) 0.5 % APPLY 1 APPLICATION TOPICALLY TWICE DAILY 454 g 1   No current facility-administered medications for this visit.    Allergies: is allergic to aspirin and amlodipine.  Physical Exam:  BP (!) 150/96   Pulse 76   Ht 5\' 3"  (1.6 m)   Wt (!) 310 lb (140.6 kg)   BMI 54.91 kg/m  Body mass index is 54.91 kg/m. General appearance: Well nourished, well developed female in no acute distress.  Abdomen: diffusely non tender to palpation, non distended, and no masses, hernias Neuro/Psych:  Normal mood and affect.    Pelvic exam:  EGBUS: normal; pt very sensitive with pelvic exam Vagina: no bleeding. Copious amounts of white cottage cheese like d/c Cervix: visually normal  Patient too uncomfortable to continue past pap smear   Radiology: Narrative & Impression  CLINICAL DATA:  Heavy uterine bleeding, abnormal uterine bleeding, irregular menses, unknown LMP; worksheet indicates patient is premenopausal   EXAM: TRANSABDOMINAL AND TRANSVAGINAL ULTRASOUND OF PELVIS   TECHNIQUE: Both transabdominal and transvaginal ultrasound examinations of  the pelvis were performed. Transabdominal technique was performed for global imaging of the pelvis including uterus, ovaries, adnexal regions, and pelvic cul-de-sac. It was necessary to proceed with endovaginal exam following the transabdominal exam to visualize the adnexa.   COMPARISON:  10/20/2018   FINDINGS: Uterus   Measurements: 11.8 x 5.2 x 5.6 cm = volume: 180 mL. Heterogeneous myometrial echogenicity. Anteverted.  Uterine nodularity consistent with leiomyomata. Three measured fibroids include 3.0 cm subserosal LEFT anterior fundal leiomyoma, 2.2 cm intramural mid posterior leiomyoma, and 2.0 cm intramural RIGHT leiomyoma.   Endometrium   Thickness: 19 mm.  Abnormally thickened.  No discrete mass or fluid   Right ovary   Measurements: 2.1 x 1.3 x 1.7 cm = volume: 2.4 mL. Normal morphology without mass   Left ovary   Not visualized, likely obscured by bowel   Other findings   No free pelvic fluid or adnexal masses.   IMPRESSION: Multiple uterine leiomyomata.   Nonvisualization of LEFT ovary with unremarkable RIGHT ovary.   Thickened endometrial complex 19 mm thick; assuming patient is premenopausal, if bleeding remains unresponsive to hormonal or medical therapy, focal lesion work-up with sonohysterogram should be considered. Endometrial biopsy should also be considered in pre-menopausal patients at high risk for endometrial carcinoma. (Ref: Radiological Reasoning: Algorithmic Workup of Abnormal Vaginal Bleeding with Endovaginal Sonography and Sonohysterography. AJR 2008; 410:V01-31) )     Electronically Signed   By: Lavonia Dana M.D.   On: 10/09/2020 14:01   Assessment: pt stable  Plan:  1. Screening mammogram for breast cancer - MM 3D SCREEN BREAST BILATERAL; Future  2. Abnormal uterine bleeding (AUB) Sensitivity could be due to infection. Swab obtained and will send in flagyl and diflucan and bring patient back to d/w her and husband, who is here with her today, re: next steps. Pt states she's had chronic AUB as well as vaginal discharge and smell s/s for quite some time. I told her it's hard to say if ultrasound is normal or not given ? Re: menopausal status given how bromocriptine made her regular in the past. I told her re: limitations with an [facility] but it's worth it to check a level today to see, which would definitely help in guiding next steps and interpretering her u/s  results.  - Cervicovaginal ancillary only( [place]) - Cytology - PAP( [place]) - Follicle stimulating hormone  3. Vulvovaginal candidiasis   RTC: 2wks  Durene Romans MD Attending Center for [facility])

## 2020-10-22 LAB — BASIC METABOLIC PANEL
BUN/Creatinine Ratio: 10 (ref 9–23)
BUN: 9 mg/dL (ref 6–24)
CO2: 24 mmol/L (ref 20–29)
Calcium: 10.1 mg/dL (ref 8.7–10.2)
Chloride: 103 mmol/L (ref 96–106)
Creatinine, Ser: 0.86 mg/dL (ref 0.57–1.00)
Glucose: 78 mg/dL (ref 65–99)
Potassium: 4.2 mmol/L (ref 3.5–5.2)
Sodium: 143 mmol/L (ref 134–144)
eGFR: 80 mL/min/{1.73_m2} (ref >59)

## 2020-10-22 LAB — FOLLICLE STIMULATING HORMONE: FSH: 29.2 m[IU]/mL

## 2020-10-22 LAB — CERVICOVAGINAL ANCILLARY ONLY
Bacterial Vaginitis (gardnerella): NEGATIVE
Candida Glabrata: NEGATIVE
Candida Vaginitis: POSITIVE — AB
Chlamydia: NEGATIVE
Comment: NEGATIVE
Comment: NEGATIVE
Comment: NEGATIVE
Comment: NEGATIVE
Comment: NEGATIVE
Comment: NORMAL
Neisseria Gonorrhea: NEGATIVE
Trichomonas: NEGATIVE

## 2020-10-23 LAB — CYTOLOGY - PAP
Comment: NEGATIVE
Diagnosis: NEGATIVE
High risk HPV: NEGATIVE

## 2020-10-24 NOTE — Telephone Encounter (Signed)
Already notified of results.  VIA phone. Dm/cma

## 2020-10-30 ENCOUNTER — Telehealth: Payer: Self-pay | Admitting: *Deleted

## 2020-10-30 NOTE — Telephone Encounter (Signed)
-----   Message from Aletha Halim, MD sent at 10/30/2020  9:37 AM EDT ----- Can you let her know that her pap is normal and she just needs another one in 5 years? thanks

## 2020-10-30 NOTE — Telephone Encounter (Signed)
Pt informed of pap resulta

## 2020-11-05 DIAGNOSIS — G4733 Obstructive sleep apnea (adult) (pediatric): Secondary | ICD-10-CM | POA: Diagnosis not present

## 2020-11-27 ENCOUNTER — Other Ambulatory Visit: Payer: Self-pay | Admitting: Obstetrics and Gynecology

## 2020-11-27 DIAGNOSIS — N6310 Unspecified lump in the right breast, unspecified quadrant: Secondary | ICD-10-CM

## 2020-12-02 ENCOUNTER — Ambulatory Visit
Admission: RE | Admit: 2020-12-02 | Discharge: 2020-12-02 | Disposition: A | Discharge status: Home / Self Care | Payer: Federal, State, Local not specified - PPO | Source: Ambulatory Visit | Attending: Obstetrics and Gynecology | Admitting: Obstetrics and Gynecology

## 2020-12-02 DIAGNOSIS — N6310 Unspecified lump in the right breast, unspecified quadrant: Secondary | ICD-10-CM

## 2020-12-02 DIAGNOSIS — N6312 Unspecified lump in the right breast, upper inner quadrant: Secondary | ICD-10-CM | POA: Diagnosis not present

## 2020-12-02 DIAGNOSIS — N6311 Unspecified lump in the right breast, upper outer quadrant: Secondary | ICD-10-CM | POA: Diagnosis not present

## 2020-12-02 DIAGNOSIS — R928 Other abnormal and inconclusive findings on diagnostic imaging of breast: Secondary | ICD-10-CM | POA: Diagnosis not present

## 2020-12-04 DIAGNOSIS — D443 Neoplasm of uncertain behavior of pituitary gland: Secondary | ICD-10-CM | POA: Diagnosis not present

## 2020-12-04 DIAGNOSIS — D352 Benign neoplasm of pituitary gland: Secondary | ICD-10-CM | POA: Diagnosis not present

## 2020-12-04 DIAGNOSIS — D353 Benign neoplasm of craniopharyngeal duct: Secondary | ICD-10-CM | POA: Diagnosis not present

## 2020-12-06 DIAGNOSIS — G4733 Obstructive sleep apnea (adult) (pediatric): Secondary | ICD-10-CM | POA: Diagnosis not present

## 2020-12-09 DIAGNOSIS — I10 Essential (primary) hypertension: Secondary | ICD-10-CM | POA: Diagnosis not present

## 2020-12-09 DIAGNOSIS — Z79899 Other long term (current) drug therapy: Secondary | ICD-10-CM | POA: Diagnosis not present

## 2020-12-09 DIAGNOSIS — D353 Benign neoplasm of craniopharyngeal duct: Secondary | ICD-10-CM | POA: Diagnosis not present

## 2020-12-09 DIAGNOSIS — D352 Benign neoplasm of pituitary gland: Secondary | ICD-10-CM | POA: Diagnosis not present

## 2020-12-10 ENCOUNTER — Ambulatory Visit: Payer: Federal, State, Local not specified - PPO | Admitting: Family Medicine

## 2020-12-10 ENCOUNTER — Encounter: Payer: Self-pay | Admitting: Family Medicine

## 2020-12-10 ENCOUNTER — Other Ambulatory Visit: Payer: Self-pay

## 2020-12-10 DIAGNOSIS — N939 Abnormal uterine and vaginal bleeding, unspecified: Secondary | ICD-10-CM | POA: Diagnosis not present

## 2020-12-10 NOTE — Progress Notes (Signed)
   Subjective:    Patient ID: Sherry [name] is a 55 y.o. female presenting with Follow-up  on 12/10/2020  HPI: Here for f/u. Has long h/o abnormal bleeding. Returned for pap smear and did not complete EMB. Reports cycles have slowed and are lighter. FSH was 29. U/s shows fibroids with endometrial thickness of 19 mm. She declines EMB again today.  Review of Systems  Constitutional:  Negative for chills and fever.  Respiratory:  Negative for shortness of breath.   Cardiovascular:  Negative for chest pain.  Gastrointestinal:  Negative for abdominal pain, nausea and vomiting.  Genitourinary:  Negative for dysuria.  Skin:  Negative for rash.     Objective:    BP (!) 156/104   Pulse 83   Wt 299 lb (135.6 kg)   LMP 12/09/2020 (Exact Date)   BMI 52.97 kg/m  Physical Exam Constitutional:      General: She is not in acute distress.    Appearance: She is well-developed.  HENT:     Head: Normocephalic and atraumatic.  Eyes:     General: No scleral icterus. Cardiovascular:     Rate and Rhythm: Normal rate.  Pulmonary:     Effort: Pulmonary effort is normal.  Abdominal:     Palpations: Abdomen is soft.  Musculoskeletal:     Cervical back: Neck supple.  Skin:    General: Skin is warm and dry.  Neurological:     Mental Status: She is alert and oriented to person, place, and time.        Assessment & Plan:   Problem List Items Addressed This Visit       Unprioritized   Abnormal uterine bleeding    She declined EMB today. Will return in 6 months and considerations of all options can be discussed again. This was done last visit with me.       Return in about 6 months (around 06/09/2021).  [name] 12/10/2020 11:25 AM

## 2020-12-10 NOTE — Progress Notes (Signed)
RGYN patient presents for F/U visit today.  Last seen 10/21/20   Pt notes recently starting to have some spotting but believes may be cycle not having heavy bleeding she was having before.   Flu Vaccine : Declined

## 2020-12-11 ENCOUNTER — Encounter: Payer: Self-pay | Admitting: Family Medicine

## 2020-12-11 NOTE — Assessment & Plan Note (Addendum)
She declined EMB today. Will return in 6 months and considerations of all options can be discussed again. This was done last visit with me.

## 2021-01-05 DIAGNOSIS — G4733 Obstructive sleep apnea (adult) (pediatric): Secondary | ICD-10-CM | POA: Diagnosis not present

## 2021-01-12 NOTE — Progress Notes (Signed)
PATIENT: Sherry [name] DOB: 11-Nov-1965  REASON FOR VISIT: follow up HISTORY FROM: patient  Chief Complaint  Patient presents with   Follow-up    Pt with husband, rm 4. Overall states things are stable and has no issus or concerns. DME [employer]. Resmed machine     HISTORY OF PRESENT ILLNESS:  01/12/21 ALL: Sherry [name] returns for follow up for OSA on CPAP. She reports that she is doing very well. She reports CPAP has been the best thing that has ever happened to her. She sleeps very well. She doesn't have trouble with insomnia any longer. She is sleeping well. No concerns with machine or supplies.   BP has been elevated at home. Usually around 140-150/90's. She recently stopped lisinopril and Maxide and reports that readings are unchanged. She feels better off these medications. She is followed closely by PCP. She was last seen 07/2020.     02/20/2019 ALL:  Sherry [name] is a 55 y.o. female here today for follow up.  She is doing very well on CPAP therapy.  She reports that she cannot sleep without her CPAP machine.  She notes significant improvements in sleep quality as well as increased energy.  She is working on lifestyle changes.  She has recently started monitoring her intake of simple sugars.  She has lost at least 5 to 10 pounds.  She is feeling much more energized.  She feels that mental clarity is sharper.  She denies any seizure like activity.  She is followed by endocrinology.  Compliance report dated 01/20/2019 through 02/18/2019 reveals that she use CPAP every night for compliance of 100%.  Every night she used CPAP greater than 4 hours for compliance of 100%.  Average usage was 7 hours and 56 minutes.  Residual AHI was 1.3 on 5 to 13 cm of water and EPR of 3.  There was no significant leak noted.  BP 133/85 WT 300lbs.   History (copied from Hilda note on 05/03/2018)  Sherry [name] is a 55 y.o. female with recent diagnosis of OSA and CPAP  initiation. She was initially scheduled for face-to-face office visit today at this time for initial CPAP compliance visit but due to [place], face-to-face office visit rescheduled for non-face-to-face telephone visit.     CPAP compliance report from 04/03/2018 -05/02/2018 shows 30 out of 30 usage days with 30 days greater than 4 hours for 100% compliance.  Average usage 9 hours and 1 minute with residual AHI 1.9.  Leaks in the 95th percentile 17.7.  Pressure in the 95th percentile 11.8 with minimum pressure 5 cm H2O and max pressure 13 cm H2O with a EPR 3.   She reports doing exceptionally well on CPAP with overall improvement of her energy level and sleep quality at night.  She denies any additional symptoms of vertigo or dizziness as she was having these symptoms prior to initiating CPAP.  She feels as though she is more energized during the day and is planning on getting into an exercise/workout routine as she had difficulty prior due to lack of energy.  She does have concerns regarding her current DME company [employer] as she has been experiencing difficulties reaching them and will have to make multiple phone calls.     Epworth Sleepiness Scale: 5/24 (today's visit) Epworth Sleepiness Scale: 11/24 (12/21/2017)   Of note, during titration study EEG abnormalities identified and discussion with Dr. Brett Fairy regarding potentially initiating AED but decided on using CPAP first as she does have  a history of pituitary adenoma.  She states she followed up with her endocrinologist who recommended not initiating AED unless repeat sleep study or EEG obtained to assess for additional seizure activity or abnormalities.   REVIEW OF SYSTEMS: Out of a complete 14 system review of symptoms, the patient complains only of the following symptoms, none and all other reviewed systems are negative.  ESS: 7  ALLERGIES: Allergies  Allergen Reactions   Aspirin Rash, Nausea And Vomiting, Shortness Of Breath and Hives    Amlodipine Other (See Comments)    Muscle pain    HOME MEDICATIONS: Outpatient Medications Prior to Visit  Medication Sig Dispense Refill   Blood Pressure Monitoring (OMRON 5 SERIES BP MONITOR) DEVI      bromocriptine (PARLODEL) 2.5 MG tablet TAKE ONE (1) TABLET BY MOUTH ONCE DAILY     Cholecalciferol (VITAMIN D3) 5000 units TABS Take by mouth.     Multiple Vitamins-Minerals (MULTIVITAMIN ADULT PO) Take by mouth.     Omega-3 Fatty Acids (FISH OIL CONCENTRATE) 300 MG CAPS Take 500 mg by mouth.     triamcinolone cream (KENALOG) 0.5 % APPLY 1 APPLICATION TOPICALLY TWICE DAILY 454 g 1   furosemide (LASIX) 20 MG tablet Take 1 tablet (20 mg total) by mouth 3 (three) times a week. 36 tablet 1   lisinopril (ZESTRIL) 20 MG tablet Take 1 tablet (20 mg total) by mouth daily. 90 tablet 3   No facility-administered medications prior to visit.    PAST MEDICAL HISTORY: Past Medical History:  Diagnosis Date   Brain tumor (benign) ([place]) 1996   benign pituitary adenoma   Chickenpox    OSA (obstructive sleep apnea)     PAST SURGICAL HISTORY: Past Surgical History:  Procedure Laterality Date   CRANIECTOMY / CRANIOTOMY FOR EXCISION OF BRAIN TUMOR  1996   pituitary adenoma   WISDOM TOOTH EXTRACTION      FAMILY HISTORY: Family History  Problem Relation Age of Onset   Hypertension Mother    Heart attack Father 4       result of DVT   Pulmonary embolism Father    Hypertension Maternal Aunt    Cancer Maternal Aunt        breast cancer   CVA Maternal Aunt 70   Stroke Maternal Aunt 70   Breast cancer Maternal Aunt    Hypertension Maternal Uncle     SOCIAL HISTORY: Social History   Socioeconomic History   Marital status: Married    Spouse name: Not on file   Number of children: 0   Years of education: Not on file   Highest education level: Not on file  Occupational History   Not on file  Tobacco Use   Smoking status: Never   Smokeless tobacco: Never  Vaping Use   Vaping Use:  Never used  Substance and Sexual Activity   Alcohol use: Never   Drug use: Never   Sexual activity: Yes  Other Topics Concern   Not on file  Social History Narrative   Not on file   Social Determinants of Health   Financial Resource Strain: Not on file  Food Insecurity: Not on file  Transportation Needs: Not on file  Physical Activity: Not on file  Stress: Not on file  Social Connections: Not on file  Intimate Partner Violence: Not on file     PHYSICAL EXAM  Vitals:   01/13/21 0916 01/13/21 0939  BP: (!) 117/116 (!) 160/110  Pulse: 86   Weight: 295 lb (  133.8 kg)   Height: 5\' 3"  (1.6 m)    Body mass index is 52.26 kg/m.  Generalized: Well developed, in no acute distress  Cardiology: normal rate and rhythm, no murmur noted Respiratory: clear to auscultation bilaterally  Neurological examination  Mentation: Alert oriented to time, place, history taking. Follows all commands speech and language fluent Cranial nerve II-XII: Pupils were equal round reactive to light. Extraocular movements were full, visual field were full  Motor: The motor testing reveals 5 over 5 strength of all 4 extremities. Good symmetric motor tone is noted throughout.  Gait and station: Gait is normal.    DIAGNOSTIC DATA (LABS, IMAGING, TESTING) - I reviewed patient records, labs, notes, testing and imaging myself where available.  No flowsheet data found.   Lab Results  Component Value Date   WBC 11.2 (H) 04/11/2020   HGB 13.0 04/11/2020   HCT 38.6 04/11/2020   MCV 89 04/11/2020   PLT 304 04/11/2020      Component Value Date/Time   NA 143 10/21/2020 1138   K 4.2 10/21/2020 1138   CL 103 10/21/2020 1138   CO2 24 10/21/2020 1138   GLUCOSE 78 10/21/2020 1138   GLUCOSE 88 06/07/2017 1115   BUN 9 10/21/2020 1138   CREATININE 0.86 10/21/2020 1138   CALCIUM 10.1 10/21/2020 1138   PROT 6.9 09/12/2019 1459   ALBUMIN 3.9 09/12/2019 1459   AST 14 09/12/2019 1459   ALT 18 09/12/2019 1459    ALKPHOS 96 09/12/2019 1459   BILITOT 0.3 09/12/2019 1459   GFRNONAA 79 10/25/2019 0903   GFRAA 91 10/25/2019 0903   Lab Results  Component Value Date   CHOL 196 09/12/2019   HDL 49 09/12/2019   LDLCALC 128 (H) 09/12/2019   TRIG 107 09/12/2019   CHOLHDL 4.0 09/12/2019   Lab Results  Component Value Date   HGBA1C 6.3 (H) 04/11/2020   Lab Results  Component Value Date   KDTOIZTI45 809 06/15/2018   Lab Results  Component Value Date   TSH 0.577 09/12/2019     ASSESSMENT AND PLAN 55 y.o. year old female  has a past medical history of Brain tumor (benign) ([place]) (1996), Chickenpox, and OSA (obstructive sleep apnea). here with     ICD-10-CM   1. OSA (obstructive sleep apnea)  G47.33 For home use only DME continuous positive airway pressure (CPAP)        Deriona Altemose is doing well on CPAP therapy. Compliance report reveals excellent compliance. She was encouraged to continue using CPAP nightly and for greater than 4 hours each night. We will update supply orders as indicated. Risks of untreated sleep apnea review and education materials provided. I have reviewed her BP reading and educated her on the importance of BP management. She is asymptomatic, today. Red flag warnings reviewed and she is aware of when to seek emergency medical attention. Education material provided in AVS. She will contact PCP to discuss asap. Healthy lifestyle habits encouraged. She will follow up in 1 year, sooner if needed. She verbalizes understanding and agreement with this plan.    Orders Placed This Encounter  Procedures   For home use only DME continuous positive airway pressure (CPAP)    Supplies    Order Specific Question:   Length of Need    Answer:   Lifetime    Order Specific Question:   Patient has OSA or probable OSA    Answer:   Yes    Order Specific Question:   Is  the patient currently using CPAP in the home    Answer:   Yes    Order Specific Question:   Settings    Answer:   Other  see comments    Order Specific Question:   CPAP supplies needed    Answer:   Mask, headgear, cushions, filters, heated tubing and water chamber      No orders of the defined types were placed in this encounter.     Debbora Presto, FNP-C 01/13/2021, 9:39 AM [facility] [address], [place], [place] [address] [phone]

## 2021-01-12 NOTE — Patient Instructions (Addendum)
Please continue using your CPAP regularly. While your insurance requires that you use CPAP at least 4 hours each night on 70% of the nights, I recommend, that you not skip any nights and use it throughout the night if you can. Getting used to CPAP and staying with the treatment long term does take time and patience and discipline. Untreated obstructive sleep apnea when it is moderate to severe can have an adverse impact on cardiovascular health and raise her risk for heart disease, arrhythmias, hypertension, congestive heart failure, stroke and diabetes. Untreated obstructive sleep apnea causes sleep disruption, nonrestorative sleep, and sleep deprivation. This can have an impact on your day to day functioning and cause daytime sleepiness and impairment of cognitive function, memory loss, mood disturbance, and problems focussing. Using CPAP regularly can improve these symptoms.   Please call PCP to discuss elevated BP. Please seek emergency medical attention for any red flag warnings as discussed.   Follow up with me in 1 year

## 2021-01-13 ENCOUNTER — Encounter: Payer: Self-pay | Admitting: Family Medicine

## 2021-01-13 ENCOUNTER — Ambulatory Visit: Payer: Federal, State, Local not specified - PPO | Admitting: Family Medicine

## 2021-01-13 VITALS — BP 176/76 | HR 78 | Ht 63.0 in | Wt 295.0 lb

## 2021-01-13 DIAGNOSIS — G4733 Obstructive sleep apnea (adult) (pediatric): Secondary | ICD-10-CM | POA: Diagnosis not present

## 2021-02-03 DIAGNOSIS — G4733 Obstructive sleep apnea (adult) (pediatric): Secondary | ICD-10-CM | POA: Diagnosis not present

## 2021-02-18 ENCOUNTER — Encounter: Payer: Self-pay | Admitting: Gastroenterology

## 2021-03-09 ENCOUNTER — Other Ambulatory Visit: Payer: Self-pay

## 2021-03-10 ENCOUNTER — Ambulatory Visit: Payer: Federal, State, Local not specified - PPO | Admitting: Nurse Practitioner

## 2021-03-10 ENCOUNTER — Encounter: Payer: Self-pay | Admitting: Nurse Practitioner

## 2021-03-10 VITALS — BP 160/100 | HR 75 | Temp 98.2°F | Resp 16 | Ht 63.0 in | Wt 303.2 lb

## 2021-03-10 DIAGNOSIS — I152 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: Secondary | ICD-10-CM

## 2021-03-10 DIAGNOSIS — R739 Hyperglycemia, unspecified: Secondary | ICD-10-CM

## 2021-03-10 LAB — BASIC METABOLIC PANEL
BUN: 10 mg/dL (ref 6–23)
CO2: 29 mEq/L (ref 19–32)
Calcium: 9.7 mg/dL (ref 8.4–10.5)
Chloride: 105 mEq/L (ref 96–112)
Creatinine, Ser: 0.76 mg/dL (ref 0.40–1.20)
GFR: 87.8 mL/min (ref >60.00)
Glucose, Bld: 92 mg/dL (ref 70–99)
Potassium: 4.1 mEq/L (ref 3.5–5.1)
Sodium: 140 mEq/L (ref 135–145)

## 2021-03-10 LAB — LIPID PANEL
Cholesterol: 185 mg/dL (ref 0–200)
HDL: 58.5 mg/dL (ref >39.00)
LDL Cholesterol: 110 mg/dL — ABNORMAL HIGH (ref 0–99)
NonHDL: 126.27
Total CHOL/HDL Ratio: 3
Triglycerides: 82 mg/dL (ref 0.0–149.0)
VLDL: 16.4 mg/dL (ref 0.0–40.0)

## 2021-03-10 LAB — HEMOGLOBIN A1C: Hgb A1c MFr Bld: 6.1 % (ref 4.6–6.5)

## 2021-03-10 LAB — TSH: TSH: 0.48 u[IU]/mL (ref 0.35–5.50)

## 2021-03-10 NOTE — Assessment & Plan Note (Addendum)
uncontrolled She discontinued lisinopril and maxzide due to possible side effects: acne. Reports acne has resolved since discontinuation of medication. She does not monitor BP at home. Admits to high sodium diet and lack pf exercise. Reports she is compliant with use of CPAP machine. BP Readings from Last 3 Encounters:  03/10/21 (!) 160/100  01/13/21 (!) 176/76  12/10/20 (!) 156/104   Advised about the importance of medications compliance and the consequences of uncontrolled HTN. We also discussed use of hydralazine in place of lisinopril and maxzide. She declined new medication. She opted to resume lisinopril and maxzide, and also requested for referral to cardiology. Start lisinopril 20mg  daily and maxzide 0.5tab daily Monitor BP daily in AM Increase lisinopril to 40mg  if BP >140/80 after 1week. Advised to maintain DASh diet. Check BMP, TSH F/up in 52months

## 2021-03-10 NOTE — Patient Instructions (Addendum)
Resume lisinopril 20mg  and maxzide half tab. Increase lisinopril to 40mg  if BP >140/80 after 1weeks. Maintain DASh diet and regular exercise. You will be contacted to schedule appt with cardiology.  Go to lab for blood draw  DASH Eating Plan DASH stands for Dietary Approaches to Stop Hypertension. The DASH eating plan is a healthy eating plan that has been shown to: Reduce high blood pressure (hypertension). Reduce your risk for type 2 diabetes, heart disease, and stroke. Help with weight loss. What are tips for following this plan? Reading food labels Check food labels for the amount of salt (sodium) per serving. Choose foods with less than 5 percent of the Daily Value of sodium. Generally, foods with less than 300 milligrams (mg) of sodium per serving fit into this eating plan. To find whole grains, look for the word "whole" as the first word in the ingredient list. Shopping Buy products labeled as "low-sodium" or "no salt added." Buy fresh foods. Avoid canned foods and pre-made or frozen meals. Cooking Avoid adding salt when cooking. Use salt-free seasonings or herbs instead of table salt or sea salt. Check with your health care provider or pharmacist before using salt substitutes. Do not fry foods. Cook foods using healthy methods such as baking, boiling, grilling, roasting, and broiling instead. Cook with heart-healthy oils, such as olive, canola, avocado, soybean, or sunflower oil. Meal planning  Eat a balanced diet that includes: 4 or more servings of fruits and 4 or more servings of vegetables each day. Try to fill one-half of your plate with fruits and vegetables. 6-8 servings of whole grains each day. Less than 6 oz (170 g) of lean meat, poultry, or fish each day. A 3-oz (85-g) serving of meat is about the same size as a deck of cards. One egg equals 1 oz (28 g). 2-3 servings of low-fat dairy each day. One serving is 1 cup (237 mL). 1 serving of nuts, seeds, or beans 5 times  each week. 2-3 servings of heart-healthy fats. Healthy fats called omega-3 fatty acids are found in foods such as walnuts, flaxseeds, fortified milks, and eggs. These fats are also found in cold-water fish, such as sardines, salmon, and mackerel. Limit how much you eat of: Canned or prepackaged foods. Food that is high in trans fat, such as some fried foods. Food that is high in saturated fat, such as fatty meat. Desserts and other sweets, sugary drinks, and other foods with added sugar. Full-fat dairy products. Do not salt foods before eating. Do not eat more than 4 egg yolks a week. Try to eat at least 2 vegetarian meals a week. Eat more home-cooked food and less restaurant, buffet, and fast food. Lifestyle When eating at a restaurant, ask that your food be prepared with less salt or no salt, if possible. If you drink alcohol: Limit how much you use to: 0-1 drink a day for women who are not pregnant. 0-2 drinks a day for men. Be aware of how much alcohol is in your drink. In the [place] one drink equals one 12 oz bottle of beer (355 mL), one 5 oz glass of wine (148 mL), or one 1 oz glass of hard liquor (44 mL). General information Avoid eating more than 2,300 mg of salt a day. If you have hypertension, you may need to reduce your sodium intake to 1,500 mg a day. Work with your health care provider to maintain a healthy body weight or to lose weight. Ask what an ideal weight is  for you. Get at least 30 minutes of exercise that causes your heart to beat faster (aerobic exercise) most days of the week. Activities may include walking, swimming, or biking. Work with your health care provider or dietitian to adjust your eating plan to your individual calorie needs. What foods should I eat? Fruits All fresh, dried, or frozen fruit. Canned fruit in natural juice (without added sugar). Vegetables Fresh or frozen vegetables (raw, steamed, roasted, or grilled). Low-sodium or reduced-sodium tomato  and vegetable juice. Low-sodium or reduced-sodium tomato sauce and tomato paste. Low-sodium or reduced-sodium canned vegetables. Grains Whole-grain or whole-wheat bread. Whole-grain or whole-wheat pasta. Brown rice. [place]. Bulgur. Whole-grain and low-sodium cereals. Pita bread. Low-fat, low-sodium crackers. Whole-wheat flour tortillas. Meats and other proteins Skinless chicken or [place]. Ground chicken or [place]. Pork with fat trimmed off. Fish and seafood. Egg whites. Dried beans, peas, or lentils. Unsalted nuts, nut butters, and seeds. Unsalted canned beans. Lean cuts of beef with fat trimmed off. Low-sodium, lean precooked or cured meat, such as sausages or meat loaves. Dairy Low-fat (1%) or fat-free (skim) milk. Reduced-fat, low-fat, or fat-free cheeses. Nonfat, low-sodium ricotta or cottage cheese. Low-fat or nonfat yogurt. Low-fat, low-sodium cheese. Fats and oils Soft margarine without trans fats. Vegetable oil. Reduced-fat, low-fat, or light mayonnaise and salad dressings (reduced-sodium). Canola, safflower, olive, avocado, soybean, and sunflower oils. Avocado. Seasonings and condiments Herbs. Spices. Seasoning mixes without salt. Other foods Unsalted popcorn and pretzels. Fat-free sweets. The items listed above may not be a complete list of foods and beverages you can eat. Contact a dietitian for more information. What foods should I avoid? Fruits Canned fruit in a light or heavy syrup. Fried fruit. Fruit in cream or butter sauce. Vegetables Creamed or fried vegetables. Vegetables in a cheese sauce. Regular canned vegetables (not low-sodium or reduced-sodium). Regular canned tomato sauce and paste (not low-sodium or reduced-sodium). Regular tomato and vegetable juice (not low-sodium or reduced-sodium). Angie Fava. Olives. Grains Baked goods made with fat, such as croissants, muffins, or some breads. Dry pasta or rice meal packs. Meats and other proteins Fatty cuts of meat. Ribs.  Fried meat. Berniece Salines. Bologna, salami, and other precooked or cured meats, such as sausages or meat loaves. Fat from the back of a pig (fatback). Bratwurst. Salted nuts and seeds. Canned beans with added salt. Canned or smoked fish. Whole eggs or egg yolks. Chicken or [place] with skin. Dairy Whole or 2% milk, cream, and half-and-half. Whole or full-fat cream cheese. Whole-fat or sweetened yogurt. Full-fat cheese. Nondairy creamers. Whipped toppings. Processed cheese and cheese spreads. Fats and oils Butter. Stick margarine. Lard. Shortening. Ghee. Bacon fat. Tropical oils, such as coconut, palm kernel, or palm oil. Seasonings and condiments Onion salt, garlic salt, seasoned salt, table salt, and sea salt. Worcestershire sauce. Tartar sauce. Barbecue sauce. Teriyaki sauce. Soy sauce, including reduced-sodium. Steak sauce. Canned and packaged gravies. Fish sauce. Oyster sauce. Cocktail sauce. Store-bought horseradish. Ketchup. Mustard. Meat flavorings and tenderizers. Bouillon cubes. Hot sauces. Pre-made or packaged marinades. Pre-made or packaged taco seasonings. Relishes. Regular salad dressings. Other foods Salted popcorn and pretzels. The items listed above may not be a complete list of foods and beverages you should avoid. Contact a dietitian for more information. Where to find more information National Heart, Lung, and Blood Institute: https://wilson-eaton.com/ American Heart Association: www.heart.org Academy of Nutrition and Dietetics: www.eatright.[place]: www.kidney.org Summary The DASH eating plan is a healthy eating plan that has been shown to reduce high blood pressure (hypertension). It  may also reduce your risk for type 2 diabetes, heart disease, and stroke. When on the DASH eating plan, aim to eat more fresh fruits and vegetables, whole grains, lean proteins, low-fat dairy, and heart-healthy fats. With the DASH eating plan, you should limit salt (sodium) intake to 2,300 mg  a day. If you have hypertension, you may need to reduce your sodium intake to 1,500 mg a day. Work with your health care provider or dietitian to adjust your eating plan to your individual calorie needs. This information is not intended to replace advice given to you by your health care provider. Make sure you discuss any questions you have with your health care provider. Document Revised: 12/22/2018 Document Reviewed: 12/22/2018 Elsevier Patient Education  2022 [employer].

## 2021-03-10 NOTE — Progress Notes (Signed)
Subjective:  Patient ID: Sherry [name], female    DOB: 01-12-1966  Age: 56 y.o. MRN: [id number]  CC: Follow-up (6 month f/u on HTN and hyperglycemia.)  HPI Accompanied by her husband.  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 Reports acne has resolved since discontinuation of medication. She does not monitor BP at home. Admits to high sodium diet and lack pf exercise. Reports she is compliant with use of CPAP machine. BP Readings from Last 3 Encounters:  03/10/21 (!) 160/100  01/13/21 (!) 176/76  12/10/20 (!) 156/104   Advised about the importance of medications compliance and the consequences of uncontrolled HTN. We also discussed use of hydralazine in place of lisinopril and maxzide. She declined new medication. She opted to resume lisinopril and maxzide, and also requested for referral to cardiology. Start lisinopril 20mg  daily Monitor BP daily in AM Increase lisinopril to 40mg  if BP >140/80 after 1week. Advised to maintain DASh diet. Check BMP, TSH F/up in 32months   Reviewed past Medical, Social and Family history today.  Outpatient Medications Prior to Visit  Medication Sig Dispense Refill   bromocriptine (PARLODEL) 2.5 MG tablet TAKE ONE (1) TABLET BY MOUTH ONCE DAILY     Cholecalciferol (VITAMIN D3) 5000 units TABS Take by mouth.     lisinopril (ZESTRIL) 20 MG tablet Take 1 tablet (20 mg total) by mouth daily. Increase to 40mg  if BP >140/80 after 1week 90 tablet 3   Multiple Vitamins-Minerals (MULTIVITAMIN ADULT PO) Take by mouth.     Omega-3 Fatty Acids (FISH OIL CONCENTRATE) 300 MG CAPS Take 500 mg by mouth.     triamcinolone cream (KENALOG) 0.5 % APPLY 1 APPLICATION TOPICALLY TWICE DAILY 454 g 1   triamterene-hydrochlorothiazide (MAXZIDE-25) 37.5-25 MG tablet Take 0.5 tablets by mouth daily. 45 tablet 3   Blood Pressure Monitoring (OMRON 5 SERIES BP MONITOR) DEVI  (Patient  not taking: Reported on 03/10/2021)     No facility-administered medications prior to visit.   ROS See HPI  Objective:  BP (!) 160/100 (BP Location: Left Arm)    Pulse 75    Temp 98.2 F (36.8 C) (Oral)    Resp 16    Ht 5\' 3"  (1.6 m)    Wt (!) 303 lb 3.2 oz (137.5 kg)    SpO2 98%    BMI 53.71 kg/m   Physical Exam Constitutional:      Appearance: She is obese.  Cardiovascular:     Rate and Rhythm: Normal rate and regular rhythm.     Pulses: Normal pulses.     Heart sounds: Normal heart sounds.  Pulmonary:     Effort: Pulmonary effort is normal.     Breath sounds: Normal breath sounds.  Musculoskeletal:     Right lower leg: No edema.     Left lower leg: No edema.  Neurological:     Mental Status: She is alert and oriented to person, place, and time.   Assessment & Plan:  This visit occurred during the SARS-CoV-2 public health emergency.  Safety protocols were in place, including screening questions prior to the visit, additional usage of staff PPE, and extensive cleaning of exam room while observing appropriate contact time as indicated for disinfecting solutions.   Sherry [name] was seen today for follow-up.  Diagnoses and all orders for this visit:  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 -     TSH -     Ambulatory referral to Cardiology  Hyperglycemia -     Hemoglobin A1c  Morbid obesity (HCC) -     Hemoglobin A1c -     TSH -     Basic metabolic panel -     Lipid panel   Problem List Items Addressed This Visit       Cardiovascular and Mediastinum   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 - Primary    uncontrolled She discontinued lisinopril and maxzide due to possible side effects: acne. Reports acne has resolved since discontinuation of medication. She does not monitor BP at home. Admits to high sodium diet and lack pf exercise. Reports she is compliant with use of CPAP machine. BP Readings from Last 3 Encounters:  03/10/21 (!) 160/100  01/13/21 (!)  176/76  12/10/20 (!) 156/104   Advised about the importance of medications compliance and the consequences of uncontrolled HTN. We also discussed use of hydralazine in place of lisinopril and maxzide. She declined new medication. She opted to resume lisinopril and maxzide, and also requested for referral to cardiology. Start lisinopril 20mg  daily Monitor BP daily in AM Increase lisinopril to 40mg  if BP >140/80 after 1week. Advised to maintain DASh diet. Check BMP, TSH F/up in 2months      Relevant Medications   lisinopril (ZESTRIL) 20 MG tablet   triamterene-hydrochlorothiazide (MAXZIDE-25) 37.5-25 MG tablet   Other Relevant Orders   TSH   Ambulatory referral to Cardiology   Other Visit Diagnoses     Hyperglycemia       Relevant Orders   Hemoglobin A1c   Morbid obesity ([place])       Relevant Orders   Hemoglobin A1c   TSH   Basic metabolic panel   Lipid panel       I have spent 46mins with this patient regarding history taking, documentation, review of labs, formulating plan and discussing treatment options with patient.   Follow-up: Return in about 2 months (around 05/08/2021) for HTN f/up.  Wilfred Lacy, NP

## 2021-03-13 ENCOUNTER — Ambulatory Visit: Payer: Federal, State, Local not specified - PPO | Admitting: Gastroenterology

## 2021-03-16 DIAGNOSIS — G4733 Obstructive sleep apnea (adult) (pediatric): Secondary | ICD-10-CM | POA: Diagnosis not present

## 2021-03-26 ENCOUNTER — Ambulatory Visit: Payer: Federal, State, Local not specified - PPO | Admitting: Gastroenterology

## 2021-03-26 ENCOUNTER — Encounter: Payer: Self-pay | Admitting: Gastroenterology

## 2021-03-26 VITALS — BP 126/60 | HR 89 | Resp 16 | Ht 63.0 in | Wt 307.4 lb

## 2021-03-26 DIAGNOSIS — Z1211 Encounter for screening for malignant neoplasm of colon: Secondary | ICD-10-CM

## 2021-03-26 NOTE — Progress Notes (Addendum)
Referring Provider: Flossie Buffy, NP Primary Care Physician:  Flossie Buffy, NP   Reason for Consultation:  Colon Cancer screening   IMPRESSION:  Need for colon cancer screening BMI 54.45  PLAN: Screening colonoscopy to be scheduled at the hospital   HPI: Sherry [name] is a 56 y.o. female referred by NP Nche for colon cancer screening.  The history is obtained through the patient and review of her electronic health record.  She has hypertension, obstructive sleep apnea on CPAP, prior removal of a benign pituitary adenoma in 1996, and a history of abnormal uterine bleeding. She is a Therapist, nutritional. Her husband accompanies her to this appointment.  No baseline GI symptoms. She has recently gained weight. She notes following a high fiber diet.   There is no known family history of colon cancer or polyps. No family history of stomach cancer or other GI malignancy. No family history of inflammatory bowel disease or celiac.    Past Medical History:  Diagnosis Date   Brain tumor (benign) ([place]) 1996   benign pituitary adenoma   Chickenpox    OSA (obstructive sleep apnea)     Past Surgical History:  Procedure Laterality Date   CRANIECTOMY / CRANIOTOMY FOR EXCISION OF BRAIN TUMOR  1996   pituitary adenoma   WISDOM TOOTH EXTRACTION       Current Outpatient Medications  Medication Sig Dispense Refill   bromocriptine (PARLODEL) 2.5 MG tablet TAKE ONE (1) TABLET BY MOUTH ONCE DAILY     Cholecalciferol (VITAMIN D3) 5000 units TABS Take by mouth.     Multiple Vitamins-Minerals (MULTIVITAMIN ADULT PO) Take by mouth.     Omega-3 Fatty Acids (FISH OIL CONCENTRATE) 300 MG CAPS Take 500 mg by mouth.     triamcinolone cream (KENALOG) 0.5 % APPLY 1 APPLICATION TOPICALLY TWICE DAILY 454 g 1   lisinopril (ZESTRIL) 20 MG tablet Take 1 tablet (20 mg total) by mouth daily. Increase to 40mg  if BP >140/80 after 1week (Patient not taking: Reported on 03/26/2021) 90 tablet 3    triamterene-hydrochlorothiazide (MAXZIDE-25) 37.5-25 MG tablet Take 0.5 tablets by mouth daily. (Patient not taking: Reported on 03/26/2021) 45 tablet 3   No current facility-administered medications for this visit.    Allergies as of 03/26/2021 - Review Complete 03/26/2021  Allergen Reaction Noted   Aspirin Rash, Nausea And Vomiting, Shortness Of Breath, and Hives 08/10/2011   Amlodipine Other (See Comments) 01/06/2018   Vitamin d analogs Other (See Comments) 03/10/2021    Family History  Problem Relation Age of Onset   Hypertension Mother    Heart attack Father 72       result of DVT   Pulmonary embolism Father    Hypertension Maternal Aunt    Cancer Maternal Aunt        breast cancer   CVA Maternal Aunt 70   Stroke Maternal Aunt 70   Breast cancer Maternal Aunt    Hypertension Maternal Uncle    Leukemia Maternal Grandmother    Colon cancer Neg Hx    Esophageal cancer Neg Hx    Liver cancer Neg Hx    Stomach cancer Neg Hx     Social History   Socioeconomic History   Marital status: Married    Spouse name: Not on file   Number of children: 0   Years of education: Not on file   Highest education level: Not on file  Occupational History   Not on file  Tobacco Use   Smoking status:  Never   Smokeless tobacco: Never  Vaping Use   Vaping Use: Never used  Substance and Sexual Activity   Alcohol use: Never   Drug use: Never   Sexual activity: Yes  Other Topics Concern   Not on file  Social History Narrative   Not on file   Social Determinants of Health   Financial Resource Strain: Not on file  Food Insecurity: Not on file  Transportation Needs: Not on file  Physical Activity: Not on file  Stress: Not on file  Social Connections: Not on file  Intimate Partner Violence: Not on file    Review of Systems: 12 system ROS is negative except as noted above.   Physical Exam: General:   Alert,  well-nourished, pleasant and cooperative in NAD Head:  Normocephalic  and atraumatic. Eyes:  Sclera clear, no icterus.   Conjunctiva pink. Ears:  Normal auditory acuity. Nose:  No deformity, discharge,  or lesions. Mouth:  No deformity or lesions.   Neck:  Supple; no masses or thyromegaly. Lungs:  Clear throughout to auscultation.   No wheezes. Heart:  Regular rate and rhythm; no murmurs. Abdomen:  Soft, central obesity, nontender, nondistended, normal bowel sounds, no rebound or guarding. No hepatosplenomegaly.   Rectal:  Deferred  Msk:  Symmetrical. No boney deformities LAD: No inguinal or umbilical LAD Extremities:  No clubbing or edema. Neurologic:  Alert and  oriented x4;  grossly nonfocal Skin:  Intact without significant lesions or rashes. Psych:  Alert and cooperative. Normal mood and affect.    Kathaleen Dudziak L. Tarri Glenn, MD, MPH 03/26/2021, 9:03 AM

## 2021-03-26 NOTE — Patient Instructions (Addendum)
It was my pleasure to provide care to you today. Based on our discussion, I am providing you with my recommendations below:  RECOMMENDATION(S):   Will call you once we have a date available.   COLONOSCOPY TIPS:  To reduce nausea and dehydration, stay well hydrated for 3-4 days prior to the exam.  To prevent skin/hemorrhoid irritation - prior to wiping, put A&Dointment or vaseline on the toilet paper. Keep a towel or pad on the bed.  BEFORE STARTING YOUR PREP, drink  64oz of clear liquids in the morning. This will help to flush the colon and will ensure you are well hydrated!!!!  NOTE - This is in addition to the fluids required for to complete your prep. Use of a flavored hard candy, such as grape Anise Salvo, can counteract some of the flavor of the prep and may prevent some nausea.    FOLLOW UP:  After your procedure, you will receive a call from my office staff regarding my recommendation for follow up.  BMI:  If you are age 24 or younger, your body mass index should be between 19-25. Your There is no height or weight on file to calculate BMI. If this is out of the aformentioned range listed, please consider follow up with your Primary Care Provider.   MY CHART:  The [facility] providers would like to encourage you to use [facility] to communicate with providers for non-urgent requests or questions.  Due to long hold times on the telephone, sending your provider a message by [facility] may be a faster and more efficient way to get a response.  Please allow 48 business hours for a response.  Please remember that this is for non-urgent requests.   Thank you for trusting me with your gastrointestinal care!    Thornton Park, MD, MPH

## 2021-04-01 ENCOUNTER — Telehealth: Payer: Self-pay

## 2021-04-01 NOTE — Telephone Encounter (Signed)
Called pt to schedule screening colon for May @ [facility]. States she is currently in class and will call back to schedule.  ?

## 2021-04-02 ENCOUNTER — Encounter: Payer: Self-pay | Admitting: Gastroenterology

## 2021-04-02 ENCOUNTER — Other Ambulatory Visit: Payer: Self-pay

## 2021-04-02 DIAGNOSIS — Z1211 Encounter for screening for malignant neoplasm of colon: Secondary | ICD-10-CM

## 2021-04-07 ENCOUNTER — Encounter: Payer: Self-pay | Admitting: Cardiology

## 2021-04-07 ENCOUNTER — Other Ambulatory Visit: Payer: Self-pay

## 2021-04-07 ENCOUNTER — Ambulatory Visit: Payer: Federal, State, Local not specified - PPO | Admitting: Cardiology

## 2021-04-07 VITALS — BP 150/94 | HR 83 | Ht 63.0 in | Wt 307.0 lb

## 2021-04-07 DIAGNOSIS — I10 Essential (primary) hypertension: Secondary | ICD-10-CM | POA: Diagnosis not present

## 2021-04-07 MED ORDER — IRBESARTAN 150 MG PO TABS
150.0000 mg | ORAL_TABLET | Freq: Every day | ORAL | 3 refills | Status: DC
Start: 2021-04-07 — End: 2021-04-23

## 2021-04-07 NOTE — Patient Instructions (Signed)
Medication Instructions:  ? ?Your physician has recommended you make the following change in your medication:  ? ? STOP taking Lisinopril. ? ?2.    STOP taking Triamterene Hydrochlorothiazide. ? ?3.  START taking Irbesartan 150 MG once a day. ?*If you need a refill on your cardiac medications before your next appointment, please call your pharmacy* ? ? ?Lab Work: ?None ordered ?If you have labs (blood work) drawn today and your tests are completely normal, you will receive your results only by: ?MyChart Message (if you have MyChart) OR ?A paper copy in the mail ?If you have any lab test that is abnormal or we need to change your treatment, we will call you to review the results. ? ? ?Testing/Procedures: ?None ordered ? ? ?Follow-Up: ?At [facility], you and your health needs are our priority.  As part of our continuing mission to provide you with exceptional heart care, we have created designated Provider Care Teams.  These Care Teams include your primary Cardiologist (physician) and Advanced Practice Providers (APPs -  Physician Assistants and Nurse Practitioners) who all work together to provide you with the care you need, when you need it. ? ?We recommend signing up for the patient portal called "MyChart".  Sign up information is provided on this After Visit Summary.  MyChart is used to connect with patients for Virtual Visits (Telemedicine).  Patients are able to view lab/test results, encounter notes, upcoming appointments, etc.  Non-urgent messages can be sent to your provider as well.   ?To learn more about what you can do with MyChart, go to [email or web address].   ? ?Your next appointment:   ?2-3 week(s) ? ?The format for your next appointment:   ?In Person ? ?Provider:   ? ?ONLY WITH ?Kate Sable, MD  ? ? ?Other Instructions ? ? ?

## 2021-04-07 NOTE — Progress Notes (Signed)
Cardiology Office Note:    Date:  04/07/2021   ID:  Sherry [name], DOB April 28, 1965, MRN [id number]  PCP:  Anne Ng, NP   [facility] Providers Cardiologist:  Debbe Odea, MD     Referring MD: Anne Ng, NP   Chief Complaint  Patient presents with   New Patient (Initial Visit)    Referred by PCP for 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. Meds reviewed verbally with patient.    Sherry [name] is a 56 y.o. female who is being seen today for the evaluation of hypertension at the request of Nche, Bonna Gains, NP.   History of Present Illness:    Sherry [name] is a 56 y.o. female with a hx of hypertension, OSA on CPAP, pituitary adenoma (s/p excision 1996-residual focus/neuro issues), who presents due to elevated BP.  Patient was diagnosed with hypertension in 2018.  She was initially started on amlodipine but developed muscle aches from this was stopped.  She eventually was started on lisinopril and Maxzide, she states developing facial acne which resolved in approximately 2 weeks after stopping lisinopril and Maxzide.  She has not been on any BP meds for roughly 5 to 6 months now.  Her blood pressure ranges from systolic 130s to 132G.  Patient had a pituitary tumor, resected in 1996, developed some neurological issues with lack of focus, memory impairment.  She is compliant with her CPAP mask, trying to eat healthier in order to lose weight.  She states liking sweets.  Past Medical History:  Diagnosis Date   Brain tumor (benign) (HCC) 1996   benign pituitary adenoma   Chickenpox    OSA (obstructive sleep apnea)     Past Surgical History:  Procedure Laterality Date   CRANIECTOMY / CRANIOTOMY FOR EXCISION OF BRAIN TUMOR  1996   pituitary adenoma   WISDOM TOOTH EXTRACTION      Current Medications: Current Meds  Medication Sig   bromocriptine (PARLODEL) 2.5 MG tablet TAKE ONE (1) TABLET BY MOUTH ONCE DAILY   Cholecalciferol (VITAMIN D3)  5000 units TABS Take by mouth.   irbesartan (AVAPRO) 150 MG tablet Take 1 tablet (150 mg total) by mouth daily.   Multiple Vitamins-Minerals (MULTIVITAMIN ADULT PO) Take by mouth.   Omega-3 Fatty Acids (FISH OIL CONCENTRATE) 300 MG CAPS Take 500 mg by mouth.   triamcinolone cream (KENALOG) 0.5 % APPLY 1 APPLICATION TOPICALLY TWICE DAILY   [DISCONTINUED] lisinopril (ZESTRIL) 20 MG tablet Take 20 mg by mouth daily. Increase to 40mg  if BP >140/80 after 1week   [DISCONTINUED] triamterene-hydrochlorothiazide (MAXZIDE-25) 37.5-25 MG tablet Take 0.5 tablets by mouth daily.     Allergies:   Aspirin, Amlodipine, and Vitamin d analogs   Social History   Socioeconomic History   Marital status: Married    Spouse name: Not on file   Number of children: 0   Years of education: Not on file   Highest education level: Not on file  Occupational History   Not on file  Tobacco Use   Smoking status: Never   Smokeless tobacco: Never  Vaping Use   Vaping Use: Never used  Substance and Sexual Activity   Alcohol use: Never   Drug use: Never   Sexual activity: Yes  Other Topics Concern   Not on file  Social History Narrative   Not on file   Social Determinants of Health   Financial Resource Strain: Not on file  Food Insecurity: Not on file  Transportation Needs: Not on file  Physical  Activity: Not on file  Stress: Not on file  Social Connections: Not on file     Family History: The patient's family history includes Breast cancer in her maternal aunt; CVA (age of onset: 42) in her maternal aunt; Cancer in her maternal aunt; Heart attack (age of onset: 52) in her father; Hypertension in her maternal aunt, maternal uncle, and mother; Leukemia in her maternal grandmother; Pulmonary embolism in her father; Stroke (age of onset: 26) in her maternal aunt. There is no history of Colon cancer, Esophageal cancer, Liver cancer, or Stomach cancer.  ROS:   Please see the history of present illness.      All other systems reviewed and are negative.  EKGs/Labs/Other Studies Reviewed:    The following studies were reviewed today:   EKG:  EKG is  ordered today.  The ekg ordered today demonstrates normal sinus rhythm, nonspecific T wave abnormalities  Recent Labs: 04/11/2020: Hemoglobin 13.0; Platelets 304 03/10/2021: BUN 10; Creatinine, Ser 0.76; Potassium 4.1; Sodium 140; TSH 0.48  Recent Lipid Panel    Component Value Date/Time   CHOL 185 03/10/2021 1003   CHOL 196 09/12/2019 1459   TRIG 82.0 03/10/2021 1003   HDL 58.50 03/10/2021 1003   HDL 49 09/12/2019 1459   CHOLHDL 3 03/10/2021 1003   VLDL 16.4 03/10/2021 1003   LDLCALC 110 (H) 03/10/2021 1003   LDLCALC 128 (H) 09/12/2019 1459     Risk Assessment/Calculations:          Physical Exam:    VS:  BP (!) 150/94 (BP Location: Left Arm, Patient Position: Sitting, Cuff Size: Large)   Pulse 83   Ht 5\' 3"  (1.6 m)   Wt (!) 307 lb (139.3 kg)   LMP 03/18/2021   SpO2 97%   BMI 54.38 kg/m     Wt Readings from Last 3 Encounters:  04/07/21 (!) 307 lb (139.3 kg)  03/26/21 (!) 307 lb 6.4 oz (139.4 kg)  03/10/21 (!) 303 lb 3.2 oz (137.5 kg)     GEN:  Well nourished, well developed in no acute distress HEENT: Normal NECK: No JVD; No carotid bruits LYMPHATICS: No lymphadenopathy CARDIAC: RRR, no murmurs, rubs, gallops RESPIRATORY:  Clear to auscultation without rales, wheezing or rhonchi  ABDOMEN: Soft, non-tender, non-distended MUSCULOSKELETAL:  No edema; No deformity  SKIN: Warm and dry NEUROLOGIC:  Alert and oriented x 3 PSYCHIATRIC:  Normal affect   ASSESSMENT:    1. Primary hypertension    PLAN:    In order of problems listed above:  Hypertension, did not tolerate lisinopril or Maxide in the past, developed acne.  Will pursue strategy of 1 BP medication at a time.  Start irbesartan 150 mg daily, check BP frequently at home.  Titrate irbesartan if BP not well controlled. Morbid obesity, low-calorie diet, weight  loss advised.  Encouraged to cut down sweets.  Follow-up in 2 to 3 weeks.       Medication Adjustments/Labs and Tests Ordered: Current medicines are reviewed at length with the patient today.  Concerns regarding medicines are outlined above.  Orders Placed This Encounter  Procedures   EKG 12-Lead   Meds ordered this encounter  Medications   irbesartan (AVAPRO) 150 MG tablet    Sig: Take 1 tablet (150 mg total) by mouth daily.    Dispense:  30 tablet    Refill:  3    Patient Instructions  Medication Instructions:   Your physician has recommended you make the following change in your medication:  STOP taking Lisinopril.  2.    STOP taking Triamterene Hydrochlorothiazide.  3.  START taking Irbesartan 150 MG once a day. *If you need a refill on your cardiac medications before your next appointment, please call your pharmacy*   Lab Work: None ordered If you have labs (blood work) drawn today and your tests are completely normal, you will receive your results only by: MyChart Message (if you have MyChart) OR A paper copy in the mail If you have any lab test that is abnormal or we need to change your treatment, we will call you to review the results.   Testing/Procedures: None ordered   Follow-Up: At [facility], you and your health needs are our priority.  As part of our continuing mission to provide you with exceptional heart care, we have created designated Provider Care Teams.  These Care Teams include your primary Cardiologist (physician) and Advanced Practice Providers (APPs -  Physician Assistants and Nurse Practitioners) who all work together to provide you with the care you need, when you need it.  We recommend signing up for the patient portal called "MyChart".  Sign up information is provided on this After Visit Summary.  MyChart is used to connect with patients for Virtual Visits (Telemedicine).  Patients are able to view lab/test results, encounter notes,  upcoming appointments, etc.  Non-urgent messages can be sent to your provider as well.   To learn more about what you can do with MyChart, go to [email or web address].    Your next appointment:   2-3 week(s)  The format for your next appointment:   In Person  Provider:    ONLY WITH Debbe Odea, MD    Other Instructions     Signed, Debbe Odea, MD  04/07/2021 9:47 AM    [facility]

## 2021-04-09 ENCOUNTER — Other Ambulatory Visit: Payer: Federal, State, Local not specified - PPO

## 2021-04-13 DIAGNOSIS — G4733 Obstructive sleep apnea (adult) (pediatric): Secondary | ICD-10-CM | POA: Diagnosis not present

## 2021-04-18 ENCOUNTER — Encounter: Payer: Self-pay | Admitting: Radiology

## 2021-04-23 ENCOUNTER — Ambulatory Visit: Payer: Federal, State, Local not specified - PPO | Admitting: Cardiology

## 2021-04-23 ENCOUNTER — Other Ambulatory Visit: Payer: Self-pay

## 2021-04-23 ENCOUNTER — Encounter: Payer: Self-pay | Admitting: Cardiology

## 2021-04-23 VITALS — BP 170/100 | HR 85 | Ht 63.0 in | Wt 307.0 lb

## 2021-04-23 DIAGNOSIS — I10 Essential (primary) hypertension: Secondary | ICD-10-CM

## 2021-04-23 MED ORDER — CHLORTHALIDONE 25 MG PO TABS: 25.0000 mg | ORAL_TABLET | Freq: Every day | ORAL | 3 refills | Status: DC

## 2021-04-23 NOTE — Patient Instructions (Signed)
Medication Instructions:  ? ?Your physician has recommended you make the following change in your medication:  ? ?  STOP taking Irbesartan. ? ?2.     START taking Chlorthalidone 25 MG once a day. ? ?*If you need a refill on your cardiac medications before your next appointment, please call your pharmacy* ? ? ?Lab Work: ? ?None ordered ? ?If you have labs (blood work) drawn today and your tests are completely normal, you will receive your results only by: ?MyChart Message (if you have MyChart) OR ?A paper copy in the mail ?If you have any lab test that is abnormal or we need to change your treatment, we will call you to review the results. ? ? ?Testing/Procedures: ? ?None ordered ? ? ?Follow-Up: ?At [facility], you and your health needs are our priority.  As part of our continuing mission to provide you with exceptional heart care, we have created designated Provider Care Teams.  These Care Teams include your primary Cardiologist (physician) and Advanced Practice Providers (APPs -  Physician Assistants and Nurse Practitioners) who all work together to provide you with the care you need, when you need it. ? ?We recommend signing up for the patient portal called "MyChart".  Sign up information is provided on this After Visit Summary.  MyChart is used to connect with patients for Virtual Visits (Telemedicine).  Patients are able to view lab/test results, encounter notes, upcoming appointments, etc.  Non-urgent messages can be sent to your provider as well.   ?To learn more about what you can do with MyChart, go to [email or web address].   ? ?Your next appointment:   ?1 month(s) ? ?The format for your next appointment:   ?In Person ? ?Provider:   ? ?ONLY WITH  ?Kate Sable, MD   BRING YOUR BP CUFF WITH YOU TO YOUR APPOINTMENT ? ? ?Other Instructions ? ? ?

## 2021-04-23 NOTE — Progress Notes (Signed)
?Cardiology Office Note:   ? ?Date:  04/23/2021  ? ?ID:  Sherry [name], DOB 07/18/1965, MRN [id number] ? ?PCP:  Flossie Buffy, NP ?  ?[facility] Providers ?Cardiologist:  Kate Sable, MD    ? ?Referring MD: Flossie Buffy, NP  ? ?Chief Complaint  ?Patient presents with  ? Other  ?  2-3 week follow up. Patient c.o retaining fluid in ankles. Patient states that since starting the Irbesartan she had more tired and her menstrual cycle has been lasting longer.  Meds reviewed verbally with patient.   ? ? ? ?History of Present Illness:   ? ?Sherry [name] is a 56 y.o. female with a hx of hypertension, OSA on CPAP, pituitary adenoma (s/p excision 1996-residual focus/neuro issues), who presents for follow-up.  Previously seen due to elevated BP and medication management. ? ?States being intolerant to several medications including amlodipine causing muscle aches, lisinopril and Maxzide causing facial acne.  Irbesartan was started after last visit.  Patient states having swollen ankles and abnormal menses with cycles lasting longer since starting irbesartan. ? ? ?Prior notes ?Echo 10/2019 EF 55 to 60%. ?Patient was diagnosed with hypertension in 2018.  She was initially started on amlodipine but developed muscle aches from this was stopped.  She eventually was started on lisinopril and Maxzide, she states developing facial acne which resolved in approximately 2 weeks after stopping lisinopril and Maxzide.   ? ?Patient had a pituitary tumor, resected in 1996, developed some neurological issues with lack of focus, memory impairment. ? ?Past Medical History:  ?Diagnosis Date  ? Brain tumor (benign) ([place]) 1996  ? benign pituitary adenoma  ? Chickenpox   ? OSA (obstructive sleep apnea)   ? ? ?Past Surgical History:  ?Procedure Laterality Date  ? CRANIECTOMY / [place] TUMOR  1996  ? pituitary adenoma  ? WISDOM TOOTH EXTRACTION    ? ? ?Current Medications: ?Current Meds  ?Medication Sig  ?  bromocriptine (PARLODEL) 2.5 MG tablet TAKE ONE (1) TABLET BY MOUTH ONCE DAILY  ? chlorthalidone (HYGROTON) 25 MG tablet Take 1 tablet (25 mg total) by mouth daily.  ? Cholecalciferol (VITAMIN D3) 5000 units TABS Take by mouth.  ? Multiple Vitamins-Minerals (MULTIVITAMIN ADULT PO) Take by mouth.  ? Omega-3 Fatty Acids (FISH OIL CONCENTRATE) 300 MG CAPS Take 500 mg by mouth.  ? triamcinolone cream (KENALOG) 0.5 % APPLY 1 APPLICATION TOPICALLY TWICE DAILY  ? [DISCONTINUED] irbesartan (AVAPRO) 150 MG tablet Take 1 tablet (150 mg total) by mouth daily.  ?  ? ?Allergies:   Aspirin, Amlodipine, and Vitamin d analogs  ? ?Social History  ? ?Socioeconomic History  ? Marital status: Married  ?  Spouse name: Not on file  ? Number of children: 0  ? Years of education: Not on file  ? Highest education level: Not on file  ?Occupational History  ? Not on file  ?Tobacco Use  ? Smoking status: Never  ? Smokeless tobacco: Never  ?Vaping Use  ? Vaping Use: Never used  ?Substance and Sexual Activity  ? Alcohol use: Never  ? Drug use: Never  ? Sexual activity: Yes  ?Other Topics Concern  ? Not on file  ?Social History Narrative  ? Not on file  ? ?Social Determinants of Health  ? ?Financial Resource Strain: Not on file  ?Food Insecurity: Not on file  ?Transportation Needs: Not on file  ?Physical Activity: Not on file  ?Stress: Not on file  ?Social Connections: Not on  file  ?  ? ?Family History: ?The patient's family history includes Breast cancer in her maternal aunt; CVA (age of onset: 60) in her maternal aunt; Cancer in her maternal aunt; Heart attack (age of onset: 75) in her father; Hypertension in her maternal aunt, maternal uncle, and mother; Leukemia in her maternal grandmother; Pulmonary embolism in her father; Stroke (age of onset: 72) in her maternal aunt. There is no history of Colon cancer, Esophageal cancer, Liver cancer, or Stomach cancer. ? ?ROS:   ?Please see the history of present illness.    ? All other systems reviewed  and are negative. ? ?EKGs/Labs/Other Studies Reviewed:   ? ?The following studies were reviewed today: ? ? ?EKG:  EKG not  ordered today.   ? ?Recent Labs: ?03/10/2021: BUN 10; Creatinine, Ser 0.76; Potassium 4.1; Sodium 140; TSH 0.48  ?Recent Lipid Panel ?   ?Component Value Date/Time  ? CHOL 185 03/10/2021 1003  ? CHOL 196 09/12/2019 1459  ? TRIG 82.0 03/10/2021 1003  ? HDL 58.50 03/10/2021 1003  ? HDL 49 09/12/2019 1459  ? CHOLHDL 3 03/10/2021 1003  ? VLDL 16.4 03/10/2021 1003  ? LDLCALC 110 (H) 03/10/2021 1003  ? [place] 128 (H) 09/12/2019 1459  ? ? ? ?Risk Assessment/Calculations:   ? ? ?    ? ?Physical Exam:   ? ?VS:  BP (!) 170/100 (BP Location: Left Arm, Patient Position: Sitting, Cuff Size: Large)   Pulse 85   Ht '5\' 3"'$  (1.6 m)   Wt (!) 307 lb (139.3 kg)   SpO2 98%   BMI 54.38 kg/m?    ? ?Wt Readings from Last 3 Encounters:  ?04/23/21 (!) 307 lb (139.3 kg)  ?04/07/21 (!) 307 lb (139.3 kg)  ?03/26/21 (!) 307 lb 6.4 oz (139.4 kg)  ?  ? ?GEN:  Well nourished, well developed in no acute distress ?HEENT: Normal ?NECK: No JVD; No carotid bruits ?LYMPHATICS: No lymphadenopathy ?CARDIAC: RRR, no murmurs, rubs, gallops ?RESPIRATORY:  Clear to auscultation without rales, wheezing or rhonchi  ?ABDOMEN: Soft, non-tender, non-distended ?MUSCULOSKELETAL:  No edema; No deformity  ?SKIN: Warm and dry ?NEUROLOGIC:  Alert and oriented x 3 ?PSYCHIATRIC:  Normal affect  ? ?ASSESSMENT:   ? ?1. Primary hypertension   ?2. Morbid obesity ([place])   ? ?PLAN:   ? ?In order of problems listed above: ? ?Hypertension, did not tolerate lisinopril, Maxide, irbesartan.  Start chlorthalidone 25 mg daily. ?Morbid obesity, low-calorie diet, weight loss recommended. ? ?Follow-up in 4 weeks. ? ?   ? ? ?Medication Adjustments/Labs and Tests Ordered: ?Current medicines are reviewed at length with the patient today.  Concerns regarding medicines are outlined above.  ?Orders Placed This Encounter  ?Procedures  ? EKG 12-Lead  ? ?Meds ordered this  encounter  ?Medications  ? chlorthalidone (HYGROTON) 25 MG tablet  ?  Sig: Take 1 tablet (25 mg total) by mouth daily.  ?  Dispense:  30 tablet  ?  Refill:  3  ? ? ?Patient Instructions  ?Medication Instructions:  ? ?Your physician has recommended you make the following change in your medication:  ? ?  STOP taking Irbesartan. ? ?2.     START taking Chlorthalidone 25 MG once a day. ? ?*If you need a refill on your cardiac medications before your next appointment, please call your pharmacy* ? ? ?Lab Work: ? ?None ordered ? ?If you have labs (blood work) drawn today and your tests are completely normal, you will receive your results only by: ?  MyChart Message (if you have MyChart) OR ?A paper copy in the mail ?If you have any lab test that is abnormal or we need to change your treatment, we will call you to review the results. ? ? ?Testing/Procedures: ? ?None ordered ? ? ?Follow-Up: ?At [facility], you and your health needs are our priority.  As part of our continuing mission to provide you with exceptional heart care, we have created designated Provider Care Teams.  These Care Teams include your primary Cardiologist (physician) and Advanced Practice Providers (APPs -  Physician Assistants and Nurse Practitioners) who all work together to provide you with the care you need, when you need it. ? ?We recommend signing up for the patient portal called "MyChart".  Sign up information is provided on this After Visit Summary.  MyChart is used to connect with patients for Virtual Visits (Telemedicine).  Patients are able to view lab/test results, encounter notes, upcoming appointments, etc.  Non-urgent messages can be sent to your provider as well.   ?To learn more about what you can do with MyChart, go to [email or web address].   ? ?Your next appointment:   ?1 month(s) ? ?The format for your next appointment:   ?In Person ? ?Provider:   ? ?ONLY WITH  ?Kate Sable, MD   BRING YOUR BP CUFF WITH YOU TO YOUR  APPOINTMENT ? ? ?Other Instructions ? ?  ? ?Signed, ?Kate Sable, MD  ?04/23/2021 11:04 AM    ?[place] ?

## 2021-05-14 DIAGNOSIS — G4733 Obstructive sleep apnea (adult) (pediatric): Secondary | ICD-10-CM | POA: Diagnosis not present

## 2021-05-25 ENCOUNTER — Telehealth: Payer: Self-pay | Admitting: Cardiology

## 2021-05-25 NOTE — Telephone Encounter (Signed)
Pt c/o medication issue: ? ?1. Name of Medication: chlorthalidone (HYGROTON) 25 MG tablet ? ?2. How are you currently taking this medication (dosage and times per day)? Has not started ? ?3. Are you having a reaction (difficulty breathing--STAT)? no ? ?4. What is your medication issue? Patient states her medication was changed and has not started the new one yet, because the other one took a long time to get out of her system.  ?

## 2021-05-25 NOTE — Telephone Encounter (Signed)
Spoke with patient regarding Chlorthalidone. ? ?Patient states she just started taking Chlorthalidone '25mg'$  daily today. She states she did not want to take this medication until the Irbesartan was out of her system. Patient wanted to let Dr. Garen Lah know that when she started taking Irebsartan she experienced vaginal bleeding and increased BP with systolic readings in the 795'L. She states this is why she waited to start Chlorthalidone. ? ?She has rescheduled her F/U appt for 07/02/21 to follow-up on how the Chlorthalidone is working for BP control. ? ?Will forward to Dr. Garen Lah for review. ?

## 2021-05-28 ENCOUNTER — Ambulatory Visit: Payer: Federal, State, Local not specified - PPO | Admitting: Cardiology

## 2021-06-16 ENCOUNTER — Encounter (HOSPITAL_COMMUNITY): Payer: Self-pay | Admitting: Gastroenterology

## 2021-06-16 DIAGNOSIS — G4733 Obstructive sleep apnea (adult) (pediatric): Secondary | ICD-10-CM | POA: Diagnosis not present

## 2021-06-18 ENCOUNTER — Telehealth: Payer: Self-pay | Admitting: Gastroenterology

## 2021-06-18 NOTE — Telephone Encounter (Signed)
Patient called this morning to cancel her hospital procedure appointment due to being terrified. Patient also states she prefers to have procedure done late June or July. Please advise.

## 2021-06-18 NOTE — Telephone Encounter (Signed)
Procedure has been canceled per pt request. Unfortunately, pt will be placed on Dr. Tarri Glenn wait list and will not be rescheduled until October. Pt will receive a call once the provider's schedule is available for scheduling.

## 2021-06-23 ENCOUNTER — Ambulatory Visit (HOSPITAL_COMMUNITY)
Admission: RE | Admit: 2021-06-23 | Payer: Federal, State, Local not specified - PPO | Source: Home / Self Care | Admitting: Gastroenterology

## 2021-06-23 SURGERY — COLONOSCOPY WITH PROPOFOL
Anesthesia: Monitor Anesthesia Care

## 2021-07-02 ENCOUNTER — Ambulatory Visit: Payer: Federal, State, Local not specified - PPO | Admitting: Cardiology

## 2021-07-02 ENCOUNTER — Encounter: Payer: Self-pay | Admitting: Cardiology

## 2021-07-02 VITALS — BP 132/86 | HR 82 | Ht 63.0 in | Wt 301.8 lb

## 2021-07-02 DIAGNOSIS — I10 Essential (primary) hypertension: Secondary | ICD-10-CM | POA: Diagnosis not present

## 2021-07-02 NOTE — Patient Instructions (Signed)
Medication Instructions:   Your physician recommends that you continue on your current medications as directed. Please refer to the Current Medication list given to you today.  *If you need a refill on your cardiac medications before your next appointment, please call your pharmacy*   Lab Work:  None ordered   Testing/Procedures:  None ordered   Follow-Up: At [facility], you and your health needs are our priority.  As part of our continuing mission to provide you with exceptional heart care, we have created designated Provider Care Teams.  These Care Teams include your primary Cardiologist (physician) and Advanced Practice Providers (APPs -  Physician Assistants and Nurse Practitioners) who all work together to provide you with the care you need, when you need it.  We recommend signing up for the patient portal called "MyChart".  Sign up information is provided on this After Visit Summary.  MyChart is used to connect with patients for Virtual Visits (Telemedicine).  Patients are able to view lab/test results, encounter notes, upcoming appointments, etc.  Non-urgent messages can be sent to your provider as well.   To learn more about what you can do with MyChart, go to [email or web address].    Your next appointment:   6 month(s)  The format for your next appointment:   In Person  Provider:   You may see Kate Sable, MD or one of the following Advanced Practice Providers on your designated Care Team:   Murray Hodgkins, NP Christell Faith, PA-C Cadence Kathlen Mody, [place]    Other Instructions   Important Information About Sugar

## 2021-07-02 NOTE — Progress Notes (Signed)
Cardiology Office Note:    Date:  07/02/2021   ID:  Sherry [name], DOB [birth year], MRN [id number]  PCP:  Flossie Buffy, NP   [facility] Providers Cardiologist:  Kate Sable, MD     Referring MD: Flossie Buffy, NP   No chief complaint on file.   History of Present Illness:    Sherry [name] is a 56 y.o. female with a hx of hypertension, OSA on CPAP, pituitary adenoma (s/p excision 1996-residual focus/neuro issues), who presents for follow-up.  Previously seen due to elevated BP and medication management.  Has tried several BP medications but these were all stopped due to side effects.  Medications tried included amlodipine, lisinopril, Maxzide, irbesartan.  Chlorthalidone was started after last visit.  Patient states having some spotting with menses since starting this.  Her blood pressure seems to be better.  She brought in blood pressure cuff at home today, home BP cuff was reading systolic of 417E.   Prior notes Echo 10/2019 EF 55 to 60%. Patient was diagnosed with hypertension in 2018.  She was initially started on amlodipine but developed muscle aches from this was stopped.  She eventually was started on lisinopril and Maxzide, she states developing facial acne which resolved in approximately 2 weeks after stopping lisinopril and Maxzide.    Irbesartan caused edema in her ankles.  Patient had a pituitary tumor, resected in 1996, developed some neurological issues with lack of focus, memory impairment.  Past Medical History:  Diagnosis Date   Brain tumor (benign) ([place]) 1996   benign pituitary adenoma   Chickenpox    OSA (obstructive sleep apnea)     Past Surgical History:  Procedure Laterality Date   CRANIECTOMY / CRANIOTOMY FOR EXCISION OF BRAIN TUMOR  1996   pituitary adenoma   WISDOM TOOTH EXTRACTION      Current Medications: Current Meds  Medication Sig   bromocriptine (PARLODEL) 2.5 MG tablet TAKE ONE (1) TABLET BY MOUTH ONCE DAILY    chlorthalidone (HYGROTON) 25 MG tablet Take 1 tablet (25 mg total) by mouth daily.   Cholecalciferol (VITAMIN D3) 5000 units TABS Take by mouth.   Multiple Vitamins-Minerals (MULTIVITAMIN ADULT PO) Take by mouth.   Omega-3 Fatty Acids (FISH OIL CONCENTRATE) 300 MG CAPS Take 500 mg by mouth.     Allergies:   Aspirin, Amlodipine, and Vitamin d analogs   Social History   Socioeconomic History   Marital status: Married    Spouse name: Not on file   Number of children: 0   Years of education: Not on file   Highest education level: Not on file  Occupational History   Not on file  Tobacco Use   Smoking status: Never   Smokeless tobacco: Never  Vaping Use   Vaping Use: Never used  Substance and Sexual Activity   Alcohol use: Never   Drug use: Never   Sexual activity: Yes  Other Topics Concern   Not on file  Social History Narrative   Not on file   Social Determinants of Health   Financial Resource Strain: Not on file  Food Insecurity: Not on file  Transportation Needs: Not on file  Physical Activity: Not on file  Stress: Not on file  Social Connections: Not on file     Family History: The patient's family history includes Breast cancer in her maternal aunt; CVA (age of onset: 72) in her maternal aunt; Cancer in her maternal aunt; Heart attack (age of onset: 74) in her father; Hypertension  in her maternal aunt, maternal uncle, and mother; Leukemia in her maternal grandmother; Pulmonary embolism in her father; Stroke (age of onset: 42) in her maternal aunt. There is no history of Colon cancer, Esophageal cancer, Liver cancer, or Stomach cancer.  ROS:   Please see the history of present illness.     All other systems reviewed and are negative.  EKGs/Labs/Other Studies Reviewed:    The following studies were reviewed today:   EKG:  EKG is ordered today.  EKG shows normal sinus rhythm.  Recent Labs: 03/10/2021: BUN 10; Creatinine, Ser 0.76; Potassium 4.1; Sodium 140; TSH  0.48  Recent Lipid Panel    Component Value Date/Time   CHOL 185 03/10/2021 1003   CHOL 196 09/12/2019 1459   TRIG 82.0 03/10/2021 1003   HDL 58.50 03/10/2021 1003   HDL 49 09/12/2019 1459   CHOLHDL 3 03/10/2021 1003   VLDL 16.4 03/10/2021 1003   LDLCALC 110 (H) 03/10/2021 1003   LDLCALC 128 (H) 09/12/2019 1459     Risk Assessment/Calculations:          Physical Exam:    VS:  BP 132/86   Pulse 82   Ht '5\' 3"'$  (1.6 m)   Wt (!) 301 lb 12.8 oz (136.9 kg)   LMP  (LMP Unknown) Comment: patient states that she's always bleeding.  SpO2 96%   BMI 53.46 kg/m     Wt Readings from Last 3 Encounters:  07/02/21 (!) 301 lb 12.8 oz (136.9 kg)  04/23/21 (!) 307 lb (139.3 kg)  04/07/21 (!) 307 lb (139.3 kg)     GEN:  Well nourished, well developed in no acute distress HEENT: Normal NECK: No JVD; No carotid bruits CARDIAC: RRR, no murmurs, rubs, gallops RESPIRATORY:  Clear to auscultation without rales, wheezing or rhonchi  ABDOMEN: Soft, non-tender, non-distended MUSCULOSKELETAL:  No edema; No deformity  SKIN: Warm and dry NEUROLOGIC:  Alert and oriented x 3 PSYCHIATRIC:  Normal affect   ASSESSMENT:    1. Primary hypertension   2. Morbid obesity ([place])     PLAN:    In order of problems listed above:  Hypertension, BP check using BP cuff from home was elevated with systolic of 283M.  Repeat blood pressure by myself and MA today showed systolics in the 629U.  Continue chlorthalidone 25 mg daily.  Did not tolerate lisinopril, Maxide, irbesartan.  Morbid obesity, low-calorie diet, weight loss recommended.  Follow-up in 6 months.  If BP controlled in 6 months, okay to follow-up with [place] as needed after.       Medication Adjustments/Labs and Tests Ordered: Current medicines are reviewed at length with the patient today.  Concerns regarding medicines are outlined above.  Orders Placed This Encounter  Procedures   EKG 12-Lead   No orders of the defined types were placed in  this encounter.   Patient Instructions  Medication Instructions:   Your physician recommends that you continue on your current medications as directed. Please refer to the Current Medication list given to you today.  *If you need a refill on your cardiac medications before your next appointment, please call your pharmacy*   Lab Work:  None ordered   Testing/Procedures:  None ordered   Follow-Up: At [facility], you and your health needs are our priority.  As part of our continuing mission to provide you with exceptional heart care, we have created designated Provider Care Teams.  These Care Teams include your primary Cardiologist (physician) and Advanced Practice Providers (APPs -  Physician Assistants and Nurse Practitioners) who all work together to provide you with the care you need, when you need it.  We recommend signing up for the patient portal called "MyChart".  Sign up information is provided on this After Visit Summary.  MyChart is used to connect with patients for Virtual Visits (Telemedicine).  Patients are able to view lab/test results, encounter notes, upcoming appointments, etc.  Non-urgent messages can be sent to your provider as well.   To learn more about what you can do with MyChart, go to [email or web address].    Your next appointment:   6 month(s)  The format for your next appointment:   In Person  Provider:   You may see Kate Sable, MD or one of the following Advanced Practice Providers on your designated Care Team:   Murray Hodgkins, NP Christell Faith, PA-C Cadence Kathlen Mody, [place]    Other Instructions   Important Information About Sugar         Signed, Kate Sable, MD  07/02/2021 9:31 AM    [email or web address]

## 2021-07-08 ENCOUNTER — Encounter: Payer: Self-pay | Admitting: Family Medicine

## 2021-07-08 ENCOUNTER — Telehealth (INDEPENDENT_AMBULATORY_CARE_PROVIDER_SITE_OTHER): Payer: Federal, State, Local not specified - PPO | Admitting: Family Medicine

## 2021-07-08 DIAGNOSIS — N939 Abnormal uterine and vaginal bleeding, unspecified: Secondary | ICD-10-CM | POA: Diagnosis not present

## 2021-07-08 MED ORDER — DOXYCYCLINE HYCLATE 100 MG PO CAPS: 100.0000 mg | ORAL_CAPSULE | Freq: Two times a day (BID) | ORAL | 0 refills | Status: DC

## 2021-07-08 NOTE — Progress Notes (Signed)
Still having bleeding, thinks it has something to do with her BP meds. Every time the change her meds, cause her BP to go higher and gets vaginal bleeding   When she stops the med, the bleeding does get better.   Stopped taking pills for one month to see how she would do, the bleeding did resolve, she has now started a new BP meds, and this time she has only had some spotting   Thinks maybe BP meds interacting bromocriptine  Discussed this also with Cardiologist  Bleeding has an odor as well and she can taste it

## 2021-07-08 NOTE — Progress Notes (Signed)
    GYNECOLOGY VIRTUAL VISIT ENCOUNTER NOTE  Provider location: [facility] [place] [facility]   Patient location: Home  I connected with Sherry [name] on 07/08/21 at  8:55 AM EDT by MyChart Video Encounter and verified that I am speaking with the correct person using two identifiers.   I discussed the limitations, risks, security and privacy concerns of performing an evaluation and management service virtually and the availability of in person appointments. I also discussed with the patient that there may be a patient responsible charge related to this service. The patient expressed understanding and agreed to proceed.   History:  Sherry [name] is a 56 y.o. G0P0000 female being evaluated today for f/u abnormal bleeding. Notes her BP meds seem to have her BP going up, and this leads to her bleeding worse. Had a late start to her menses and they have always been irregular. Previously declined EMB. She denies any abnormal vaginal discharge, pelvic pain or other concerns.       Past Medical History:  Diagnosis Date   Brain tumor (benign) ([place]) 1996   benign pituitary adenoma   Chickenpox    OSA (obstructive sleep apnea)    Past Surgical History:  Procedure Laterality Date   CRANIECTOMY / CRANIOTOMY FOR EXCISION OF BRAIN TUMOR  1996   pituitary adenoma   WISDOM TOOTH EXTRACTION     The following portions of the patient's history were reviewed and updated as appropriate: allergies, current medications, past family history, past medical history, past social history, past surgical history and problem list.   Health Maintenance:  Normal pap and negative HRHPV on 10/2020.  Normal mammogram on 12/02/2020.   Review of Systems:  Pertinent items noted in HPI and remainder of comprehensive ROS otherwise negative.  Physical Exam:   General:  Alert, oriented and cooperative. Patient appears to be in no acute distress.  Mental Status: Normal mood and affect. Normal behavior.  Normal judgment and thought content.   Respiratory: Normal respiratory effort, no problems with respiration noted  Rest of physical exam deferred due to type of encounter  Labs and Imaging No results found for this or any previous visit (from the past 336 hour(s)). No results found.     Assessment and Plan:     1. Abnormal uterine bleeding Thinks it might be related to her BP meds and they are interacting with her Bromocriptine. Consider repeat endometrial Biopsy--will schedule Trial of Doxycycline - doxycycline (VIBRAMYCIN) 100 MG capsule; Take 1 capsule (100 mg total) by mouth 2 (two) times daily.  Dispense: 20 capsule; Refill: 0       I discussed the assessment and treatment plan with the patient. The patient was provided an opportunity to ask questions and all were answered. The patient agreed with the plan and demonstrated an understanding of the instructions.   The patient was advised to call back or seek an in-person evaluation/go to the ED if the symptoms worsen or if the condition fails to improve as anticipated.  I provided 11 minutes of face-to-face time during this encounter.   [name], MD Center for [employer], [place]

## 2021-07-17 DIAGNOSIS — G4733 Obstructive sleep apnea (adult) (pediatric): Secondary | ICD-10-CM | POA: Diagnosis not present

## 2021-07-30 DIAGNOSIS — D352 Benign neoplasm of pituitary gland: Secondary | ICD-10-CM | POA: Diagnosis not present

## 2021-08-07 DIAGNOSIS — Z79899 Other long term (current) drug therapy: Secondary | ICD-10-CM | POA: Diagnosis not present

## 2021-08-07 DIAGNOSIS — D352 Benign neoplasm of pituitary gland: Secondary | ICD-10-CM | POA: Diagnosis not present

## 2021-08-07 DIAGNOSIS — I10 Essential (primary) hypertension: Secondary | ICD-10-CM | POA: Diagnosis not present

## 2021-08-07 DIAGNOSIS — D353 Benign neoplasm of craniopharyngeal duct: Secondary | ICD-10-CM | POA: Diagnosis not present

## 2021-08-16 DIAGNOSIS — G4733 Obstructive sleep apnea (adult) (pediatric): Secondary | ICD-10-CM | POA: Diagnosis not present

## 2021-09-01 ENCOUNTER — Telehealth: Payer: Self-pay

## 2021-09-01 NOTE — Telephone Encounter (Signed)
Left message for patient to call back to reschedule colon at [facility].

## 2021-09-02 ENCOUNTER — Other Ambulatory Visit: Payer: Self-pay | Admitting: Family Medicine

## 2021-09-02 ENCOUNTER — Ambulatory Visit: Payer: Federal, State, Local not specified - PPO | Admitting: Family Medicine

## 2021-09-02 ENCOUNTER — Encounter: Payer: Self-pay | Admitting: Family Medicine

## 2021-09-02 VITALS — BP 145/95 | HR 78 | Wt 310.0 lb

## 2021-09-02 DIAGNOSIS — Z6841 Body Mass Index (BMI) 40.0 and over, adult: Secondary | ICD-10-CM | POA: Diagnosis not present

## 2021-09-02 DIAGNOSIS — N939 Abnormal uterine and vaginal bleeding, unspecified: Secondary | ICD-10-CM | POA: Diagnosis not present

## 2021-09-02 DIAGNOSIS — I152 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: Secondary | ICD-10-CM

## 2021-09-02 NOTE — Progress Notes (Signed)
    Subjective:    Patient ID: Sherry [name] is a 56 y.o. female presenting with Vaginal Bleeding  on 09/02/2021  HPI: Reports some swelling in her LE. Also having abdominal bloating where her belly gets hard, she notes on the upper left side.[name] Here for endometrial biopsy due to abnormal bleeding. This has been on-going for some time and previously had a normal endometrial biopsy in 2020. U/S shows lining of 1.9 cm. She has declined repeat sampling in the last 4 visits. Notes that some days are lighter and some is heavier and has had to clean her bathroom floor due to heaviness and having clots. Was bleeding for the last 2 weeks. We have previously reviewed all treatment options and she has declined them all. Had late menarche and has not been proven to be menopausal as yet. She thinks her bleeding is related to medications she has been on. Has not taken her BP meds x a few days. Reports they give her back pain and urinary odor. Reports monitoring her BP at home and it is in the 130-140/90 range.  Review of Systems  Constitutional:  Negative for chills and fever.  Respiratory:  Negative for shortness of breath.   Cardiovascular:  Negative for chest pain.  Gastrointestinal:  Negative for abdominal pain, nausea and vomiting.  Genitourinary:  Negative for dysuria.  Skin:  Negative for rash.      Objective:    BP (!) 145/95   Pulse 78   Wt (!) 310 lb (140.6 kg)   BMI 54.91 kg/m  Physical Exam Exam conducted with a chaperone present.  Constitutional:      General: She is not in acute distress.    Appearance: She is well-developed.  HENT:     Head: Normocephalic and atraumatic.  Eyes:     General: No scleral icterus. Cardiovascular:     Rate and Rhythm: Normal rate.  Pulmonary:     Effort: Pulmonary effort is normal.  Abdominal:     Palpations: Abdomen is soft.  Musculoskeletal:     Cervical back: Neck supple.  Skin:    General: Skin is warm and dry.  Neurological:      Mental Status: She is alert and oriented to person, place, and time.    Procedure: Patient given informed consent, signed copy in the chart, time out was performed. Appropriate time out taken. . The patient was placed in the lithotomy position and the cervix brought into view with sterile speculum.  Portio of cervix cleansed x 2 with betadine swabs.  A tenaculum was placed in the anterior lip of the cervix.  The uterus was sounded for depth of 8 cm. A pipelle was introduced to into the uterus, suction created,  and an endometrial sample was obtained. All equipment was removed and accounted for.  The patient tolerated the procedure well.       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  [name], MD 09/02/2021 9:43 AM

## 2021-09-02 NOTE — Progress Notes (Signed)
Patient presents for Endometrial Bx due to AUB.     Pt is also concerned about weight gain and swelling. Notes with eating will have stomach discomfort Sherry [name] becomes "hard".

## 2021-09-02 NOTE — Telephone Encounter (Signed)
Left message for patient to call back  

## 2021-09-02 NOTE — Assessment & Plan Note (Signed)
Reminder to take her BP meds

## 2021-09-02 NOTE — Assessment & Plan Note (Signed)
At risk for endometrial hyperplasia/carcinoma

## 2021-09-08 ENCOUNTER — Telehealth: Payer: Self-pay | Admitting: *Deleted

## 2021-09-08 ENCOUNTER — Other Ambulatory Visit: Payer: Self-pay

## 2021-09-08 DIAGNOSIS — Z1211 Encounter for screening for malignant neoplasm of colon: Secondary | ICD-10-CM

## 2021-09-08 NOTE — Telephone Encounter (Signed)
-----   Message from Samuel Germany, RN sent at 09/07/2021 11:11 AM EDT -----  ----- Message ----- From: [name], MD Sent: 09/05/2021  10:54 PM EDT To: [facility] Clinical Pool  Her biopsy is negative--f/u for treatment options.

## 2021-09-08 NOTE — Telephone Encounter (Signed)
Called pt to inform her of results. Pt wanted to mae Dr Kennon Rounds aware to that she restarted her other BP pill [place].

## 2021-09-08 NOTE — Telephone Encounter (Signed)
Patient's colon at [facility] has been rescheduled to 11/23/21 at 8:30 am with Dr. Tarri Glenn. Patient has been provided verbal instructions, as well as updated written instructions that have been mailed home. She confirmed she is not on any blood thinners or diabetic. Amb ref placed. She has been advised to call back with any questions or concerns.

## 2021-09-15 DIAGNOSIS — G4733 Obstructive sleep apnea (adult) (pediatric): Secondary | ICD-10-CM | POA: Diagnosis not present

## 2021-09-17 ENCOUNTER — Encounter: Payer: Self-pay | Admitting: Nurse Practitioner

## 2021-09-17 ENCOUNTER — Ambulatory Visit: Payer: Federal, State, Local not specified - PPO | Admitting: Nurse Practitioner

## 2021-09-17 VITALS — BP 164/104 | HR 79 | Temp 98.4°F | Ht 63.0 in | Wt 303.2 lb

## 2021-09-17 DIAGNOSIS — N939 Abnormal uterine and vaginal bleeding, unspecified: Secondary | ICD-10-CM

## 2021-09-17 DIAGNOSIS — R739 Hyperglycemia, unspecified: Secondary | ICD-10-CM

## 2021-09-17 DIAGNOSIS — I152 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: Secondary | ICD-10-CM | POA: Diagnosis not present

## 2021-09-17 NOTE — Progress Notes (Signed)
Established Patient Visit  Patient: Sherry [name]   DOB: 04/23/1965   56 y.o. Female  MRN: [id number] Visit Date: 09/17/2021  Subjective:    Chief Complaint  Patient presents with   Acute Visit    C/o of swelling in both legs and stomach & 90% of her body w/ Vaginal bleeding with a bad smelling urine 3 weeks ago, says she feels better but wanted to mention it today.    HPI No problem-specific Assessment & Plan notes found for this encounter. Noticed increased vaginal bleeding with chlorthalidone. Has noticed same symptoms with previous medications (amlodipine, lisinopril, maxzide and irbesartan). She has hx of irregular uterine bleeding but describes as light. she noticed heavy bleeding with occassional clots 22month after starting chlorthalidone. She bled 07/15-07/19. This was associated with fatigue and lightheadness. She stopped medication 08/18/2021. Bleeding has decreased since then. She now has occassional spotting, which is normal. She had Uterine biopsy completed 09/02/2021: benign. Elevated BP and Le edema with discontinuation of chlorthalidone. BP Readings from Last 3 Encounters:  09/17/21 (!) 164/104  09/02/21 (!) 145/95  07/02/21 132/86   Repeat BP in office 150/98   Wt Readings from Last 3 Encounters:  09/17/21 (!) 303 lb 3.2 oz (137.5 kg)  09/02/21 (!) 310 lb (140.6 kg)  07/02/21 (!) 301 lb 12.8 oz (136.9 kg)    Reviewed medical, surgical, and social history today  Medications: Outpatient Medications Prior to Visit  Medication Sig   bromocriptine (PARLODEL) 2.5 MG tablet TAKE ONE (1) TABLET BY MOUTH ONCE DAILY   chlorthalidone (HYGROTON) 25 MG tablet Take 1 tablet (25 mg total) by mouth daily.   Cholecalciferol (VITAMIN D3) 5000 units TABS Take by mouth.   doxycycline (VIBRAMYCIN) 100 MG capsule Take 1 capsule (100 mg total) by mouth 2 (two) times daily.   Multiple Vitamins-Minerals (MULTIVITAMIN ADULT PO) Take by mouth.   Omega-3 Fatty Acids (FISH  OIL CONCENTRATE) 300 MG CAPS Take 500 mg by mouth.   triamcinolone cream (KENALOG) 0.5 % APPLY 1 APPLICATION TOPICALLY TWICE DAILY (Patient taking differently: as needed.)   No facility-administered medications prior to visit.   Reviewed past medical and social history.   ROS per HPI above      Objective:  BP (!) 164/104 (BP Location: Right Arm, Patient Position: Sitting, Cuff Size: Normal)   Pulse 79   Temp 98.4 F (36.9 C) (Temporal)   Ht '5\' 3"'$  (1.6 m)   Wt (!) 303 lb 3.2 oz (137.5 kg)   SpO2 97%   BMI 53.71 kg/m      Physical Exam Constitutional:      Appearance: She is obese.  Cardiovascular:     Rate and Rhythm: Normal rate and regular rhythm.     Pulses: Normal pulses.     Heart sounds: Normal heart sounds.  Pulmonary:     Effort: Pulmonary effort is normal.     Breath sounds: Normal breath sounds.  Musculoskeletal:     Right lower leg: Edema present.     Left lower leg: Edema present.  Neurological:     Mental Status: She is alert and oriented to person, place, and time.     No results found for any visits on 09/17/21.    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  Return in about 6 months (around 03/20/2022) for hyperlipidemia (fasting), HTN.     Wilfred Lacy, NP

## 2021-09-17 NOTE — Patient Instructions (Addendum)
Resume chlorthalidone F/up with cardiology and GYN Go to lab

## 2021-09-18 LAB — BASIC METABOLIC PANEL
BUN: 10 mg/dL (ref 6–23)
CO2: 23 mEq/L (ref 19–32)
Calcium: 9.3 mg/dL (ref 8.4–10.5)
Chloride: 106 mEq/L (ref 96–112)
Creatinine, Ser: 0.81 mg/dL (ref 0.40–1.20)
GFR: 81.03 mL/min (ref >60.00)
Glucose, Bld: 71 mg/dL (ref 70–99)
Potassium: 4.1 mEq/L (ref 3.5–5.1)
Sodium: 140 mEq/L (ref 135–145)

## 2021-09-18 LAB — CBC
HCT: 44 % (ref 36.0–46.0)
Hemoglobin: 14.3 g/dL (ref 12.0–15.0)
MCHC: 32.4 g/dL (ref 30.0–36.0)
MCV: 95.4 fl (ref 78.0–100.0)
Platelets: 302 10*3/uL (ref 150.0–400.0)
RBC: 4.61 Mil/uL (ref 3.87–5.11)
RDW: 15.7 % — ABNORMAL HIGH (ref 11.5–15.5)
WBC: 8.9 10*3/uL (ref 4.0–10.5)

## 2021-09-18 LAB — IRON,TIBC AND FERRITIN PANEL
%SAT: 17 % (calc) (ref 16–45)
Ferritin: 42 ng/mL (ref 16–232)
Iron: 56 ug/dL (ref 45–160)
TIBC: 328 mcg/dL (calc) (ref 250–450)

## 2021-09-18 LAB — HEMOGLOBIN A1C: Hgb A1c MFr Bld: 6.2 % (ref 4.6–6.5)

## 2021-09-20 ENCOUNTER — Encounter: Payer: Self-pay | Admitting: Nurse Practitioner

## 2021-11-13 ENCOUNTER — Other Ambulatory Visit: Payer: Self-pay

## 2021-11-13 ENCOUNTER — Encounter (HOSPITAL_COMMUNITY): Payer: Self-pay | Admitting: Gastroenterology

## 2021-11-23 ENCOUNTER — Other Ambulatory Visit: Payer: Self-pay

## 2021-11-23 ENCOUNTER — Ambulatory Visit (HOSPITAL_COMMUNITY)
Admission: RE | Admit: 2021-11-23 | Discharge: 2021-11-23 | Disposition: A | Discharge status: Home / Self Care | Payer: Federal, State, Local not specified - PPO | Source: Ambulatory Visit | Attending: Gastroenterology | Admitting: Gastroenterology

## 2021-11-23 ENCOUNTER — Encounter (HOSPITAL_COMMUNITY)
Admission: RE | Disposition: A | Discharge status: Home / Self Care | Payer: Self-pay | Source: Ambulatory Visit | Attending: Gastroenterology

## 2021-11-23 ENCOUNTER — Ambulatory Visit (HOSPITAL_COMMUNITY): Payer: Federal, State, Local not specified - PPO | Admitting: Anesthesiology

## 2021-11-23 ENCOUNTER — Encounter (HOSPITAL_COMMUNITY): Payer: Self-pay | Admitting: Gastroenterology

## 2021-11-23 DIAGNOSIS — Z79899 Other long term (current) drug therapy: Secondary | ICD-10-CM | POA: Insufficient documentation

## 2021-11-23 DIAGNOSIS — Z1211 Encounter for screening for malignant neoplasm of colon: Secondary | ICD-10-CM | POA: Diagnosis not present

## 2021-11-23 DIAGNOSIS — G4730 Sleep apnea, unspecified: Secondary | ICD-10-CM | POA: Diagnosis not present

## 2021-11-23 DIAGNOSIS — I10 Essential (primary) hypertension: Secondary | ICD-10-CM | POA: Diagnosis not present

## 2021-11-23 DIAGNOSIS — G4733 Obstructive sleep apnea (adult) (pediatric): Secondary | ICD-10-CM | POA: Diagnosis not present

## 2021-11-23 DIAGNOSIS — K621 Rectal polyp: Secondary | ICD-10-CM | POA: Diagnosis not present

## 2021-11-23 DIAGNOSIS — D128 Benign neoplasm of rectum: Secondary | ICD-10-CM | POA: Diagnosis not present

## 2021-11-23 DIAGNOSIS — Z1212 Encounter for screening for malignant neoplasm of rectum: Secondary | ICD-10-CM

## 2021-11-23 DIAGNOSIS — Z6841 Body Mass Index (BMI) 40.0 and over, adult: Secondary | ICD-10-CM | POA: Insufficient documentation

## 2021-11-23 DIAGNOSIS — K648 Other hemorrhoids: Secondary | ICD-10-CM | POA: Diagnosis not present

## 2021-11-23 DIAGNOSIS — D125 Benign neoplasm of sigmoid colon: Secondary | ICD-10-CM

## 2021-11-23 HISTORY — DX: Anemia, unspecified: D64.9

## 2021-11-23 HISTORY — DX: Essential (primary) hypertension: I10

## 2021-11-23 HISTORY — PX: POLYPECTOMY: SHX5525

## 2021-11-23 HISTORY — PX: COLONOSCOPY WITH PROPOFOL: SHX5780

## 2021-11-23 SURGERY — COLONOSCOPY WITH PROPOFOL
Anesthesia: Monitor Anesthesia Care

## 2021-11-23 MED ORDER — LACTATED RINGERS IV SOLN
INTRAVENOUS | Status: AC | PRN
  Administered 2021-11-23: 1000 mL via INTRAVENOUS

## 2021-11-23 MED ORDER — PROPOFOL 10 MG/ML IV BOLUS
INTRAVENOUS | Status: DC | PRN
  Administered 2021-11-23: 30 mg via INTRAVENOUS
  Administered 2021-11-23: 20 mg via INTRAVENOUS
  Administered 2021-11-23: 30 mg via INTRAVENOUS
  Administered 2021-11-23: 20 mg via INTRAVENOUS

## 2021-11-23 MED ORDER — PROPOFOL 500 MG/50ML IV EMUL
INTRAVENOUS | Status: AC
  Filled 2021-11-23: qty 50

## 2021-11-23 MED ORDER — ONDANSETRON HCL 4 MG/2ML IJ SOLN
INTRAMUSCULAR | Status: DC | PRN
  Administered 2021-11-23: 4 mg via INTRAVENOUS

## 2021-11-23 MED ORDER — LIDOCAINE 2% (20 MG/ML) 5 ML SYRINGE
INTRAMUSCULAR | Status: DC | PRN
  Administered 2021-11-23: 100 mg via INTRAVENOUS

## 2021-11-23 MED ORDER — PROPOFOL 500 MG/50ML IV EMUL
INTRAVENOUS | Status: DC | PRN
  Administered 2021-11-23: 125 ug/kg/min via INTRAVENOUS

## 2021-11-23 SURGICAL SUPPLY — 22 items

## 2021-11-23 NOTE — Transfer of Care (Signed)
Immediate Anesthesia Transfer of Care Note  Patient: Sherry [name]  Procedure(s) Performed: COLONOSCOPY WITH PROPOFOL POLYPECTOMY  Patient Location: PACU and Endoscopy Unit  Anesthesia Type:MAC  Level of Consciousness: awake, alert  and patient cooperative  Airway & Oxygen Therapy: Patient Spontanous Breathing and Patient connected to face mask oxygen  Post-op Assessment: Report given to RN and Post -op Vital signs reviewed and stable  Post vital signs: Reviewed and stable  Last Vitals:  Vitals Value Taken Time  BP 145/93 11/23/21 0912  Temp 36.6 C 11/23/21 0912  Pulse 86 11/23/21 0918  Resp 13 11/23/21 0918  SpO2 99 % 11/23/21 0918  Vitals shown include unvalidated device data.  Last Pain:  Vitals:   11/23/21 0912  TempSrc: Temporal  PainSc: 0-No pain         Complications: No notable events documented.

## 2021-11-23 NOTE — Anesthesia Procedure Notes (Signed)
Procedure Name: MAC Date/Time: 11/23/2021 8:37 AM  Performed by: Lollie Sails, CRNAPre-anesthesia Checklist: Patient identified, Emergency Drugs available, Suction available, Patient being monitored and Timeout performed Oxygen Delivery Method: Simple face mask Placement Confirmation: positive ETCO2

## 2021-11-23 NOTE — Op Note (Signed)
[facility] Patient Name: Sherry [name] Procedure Date: 11/23/2021 MRN: [id number] Attending MD: Thornton Park MD, MD Date of Birth: 1965-07-03 CSN: [id number] Age: 56 Admit Type: Outpatient Procedure:                Colonoscopy Indications:              Screening for colorectal malignant neoplasm, This                            is the patient's first colonoscopy                           No known family history of colon cancer or polyps Providers:                Thornton Park MD, MD, Dulcy Fanny, Theodora Blow, Technician Referring MD:              Medicines:                Monitored Anesthesia Care Complications:            No immediate complications. Estimated Blood Loss:     Estimated blood loss was minimal. Procedure:                Pre-Anesthesia Assessment:                           - Prior to the procedure, a History and Physical                            was performed, and patient medications and                            allergies were reviewed. The patient's tolerance of                            previous anesthesia was also reviewed. The risks                            and benefits of the procedure and the sedation                            options and risks were discussed with the patient.                            All questions were answered, and informed consent                            was obtained. Prior Anticoagulants: The patient has                            taken no previous anticoagulant or antiplatelet                            agents.  ASA Grade Assessment: III - A patient with                            severe systemic disease. After reviewing the risks                            and benefits, the patient was deemed in                            satisfactory condition to undergo the procedure.                           After obtaining informed consent, the colonoscope                             was passed under direct vision. Throughout the                            procedure, the patient's blood pressure, pulse, and                            oxygen saturations were monitored continuously. The                            CF-HQ190L ([id number]) Olympus colonoscope was                            introduced through the anus and advanced to the the                            cecum, identified by appendiceal orifice and                            ileocecal valve. A second forward view of the right                            colon was performed. The colonoscopy was performed                            with moderate difficulty due to the patient's                            respiratory instability (hypoxia) that responded to                            repositioning of her airway. The patient tolerated                            the procedure fairly well. The quality of the bowel                            preparation was good. The ileocecal valve,  appendiceal orifice, and rectum were photographed. Scope In: 8:42:48 AM Scope Out: 9:06:46 AM Scope Withdrawal Time: 0 hours 15 minutes 35 seconds  Total Procedure Duration: 0 hours 23 minutes 58 seconds  Findings:      The perianal and digital rectal examinations were normal.      A 3 mm polyp was found in the distal rectum. The polyp was sessile. The       polyp was removed with a cold snare. Resection and retrieval were       complete. Estimated blood loss was minimal.      The exam was otherwise without abnormality on direct and retroflexion       views except for internal hemorrhoids. Impression:               - One 3 mm polyp in the distal rectum, removed with                            a cold snare. Resected and retrieved.                           - Internal hemorrhoids.                           - The examination was otherwise normal on direct                            and retroflexion views. Moderate  Sedation:      Not Applicable - Patient had care per Anesthesia. Recommendation:           - Patient has a contact number available for                            emergencies. The signs and symptoms of potential                            delayed complications were discussed with the                            patient. Return to normal activities tomorrow.                            Written discharge instructions were provided to the                            patient.                           - Resume previous diet.                           - Continue present medications.                           - Await pathology results.                           - Repeat colonoscopy date to be determined after  pending pathology results are reviewed for                            surveillance.                           - Emerging evidence supports eating a diet of                            fruits, vegetables, grains, calcium, and yogurt                            while reducing red meat and alcohol may reduce the                            risk of colon cancer.                           - Thank you for allowing me to be involved in your                            colon cancer prevention. Procedure Code(s):        --- Professional ---                           [id number], Colonoscopy, flexible; with removal of                            tumor(s), polyp(s), or other lesion(s) by snare                            technique Diagnosis Code(s):        --- Professional ---                           Z12.11, Encounter for screening for malignant                            neoplasm of colon                           K62.1, Rectal polyp CPT copyright 2019 American [employer] Association. All rights reserved. The codes documented in this report are preliminary and upon coder review may  be revised to meet current compliance requirements. Thornton Park MD, MD 11/23/2021 9:15:29 AM This  report has been signed electronically. Number of Addenda: 0

## 2021-11-23 NOTE — Discharge Instructions (Signed)
YOU HAD AN ENDOSCOPIC PROCEDURE TODAY: Refer to the procedure report and other information in the discharge instructions given to you for any specific questions about what was found during the examination. If this information does not answer your questions, please call [place] office at [phone] to clarify.  ° °YOU SHOULD EXPECT: Some feelings of bloating in the abdomen. Passage of more gas than usual. Walking can help get rid of the air that was put into your GI tract during the procedure and reduce the bloating. If you had a lower endoscopy (such as a colonoscopy or flexible sigmoidoscopy) you may notice spotting of blood in your stool or on the toilet paper. Some abdominal soreness may be present for a day or two, also. ° °DIET: Your first meal following the procedure should be a light meal and then it is ok to progress to your normal diet. A half-sandwich or bowl of soup is an example of a good first meal. Heavy or fried foods are harder to digest and may make you feel nauseous or bloated. Drink plenty of fluids but you should avoid alcoholic beverages for 24 hours. If you had a esophageal dilation, please see attached instructions for diet.   ° °ACTIVITY: Your care partner should take you home directly after the procedure. You should plan to take it easy, moving slowly for the rest of the day. You can resume normal activity the day after the procedure however YOU SHOULD NOT DRIVE, use power tools, machinery or perform tasks that involve climbing or major physical exertion for 24 hours (because of the sedation medicines used during the test).  ° °SYMPTOMS TO REPORT IMMEDIATELY: °A gastroenterologist can be reached at any hour. Please call [phone]  for any of the following symptoms:  °Following lower endoscopy (colonoscopy, flexible sigmoidoscopy) °Excessive amounts of blood in the stool  °Significant tenderness, worsening of abdominal pains  °Swelling of the abdomen that is new, acute  °Fever of 100° or  higher  °Following upper endoscopy (EGD, EUS, ERCP, esophageal dilation) °Vomiting of blood or coffee ground material  °New, significant abdominal pain  °New, significant chest pain or pain under the shoulder blades  °Painful or persistently difficult swallowing  °New shortness of breath  °Black, tarry-looking or red, bloody stools ° °FOLLOW UP:  °If any biopsies were taken you will be contacted by phone or by letter within the next 1-3 weeks. Call [phone]  if you have not heard about the biopsies in 3 weeks.  °Please also call with any specific questions about appointments or follow up tests. ° °

## 2021-11-23 NOTE — Anesthesia Postprocedure Evaluation (Signed)
Anesthesia Post Note  Patient: Sherry [name]  Procedure(s) Performed: COLONOSCOPY WITH PROPOFOL POLYPECTOMY     Patient location during evaluation: PACU Anesthesia Type: MAC Level of consciousness: awake and alert Pain management: pain level controlled Vital Signs Assessment: post-procedure vital signs reviewed and stable Respiratory status: spontaneous breathing, nonlabored ventilation and respiratory function stable Cardiovascular status: blood pressure returned to baseline and stable Postop Assessment: no apparent nausea or vomiting Anesthetic complications: no   No notable events documented.  Last Vitals:  Vitals:   11/23/21 1000 11/23/21 1010  BP: (!) 193/99 (!) 188/103  Pulse: 79 77  Resp: 19 18  Temp:    SpO2: 100% 99%    Last Pain:  Vitals:   11/23/21 1010  TempSrc:   PainSc: 0-No pain                 Lynda Rainwater

## 2021-11-23 NOTE — H&P (Signed)
Referring Provider: No ref. provider found Primary Care Physician:  Nche, Charlene Brooke, NP   Indication for Colonoscopy:  Colon Cancer screening   IMPRESSION:  Need for colon cancer screening BMI 54.45  PLAN: Screening colonoscopy   HPI: Sherry [name] is a 56 y.o. female referred by NP Nche for colon cancer screening.  She has hypertension, obstructive sleep apnea on CPAP, prior removal of a benign pituitary adenoma in 1996, and a history of abnormal uterine bleeding. She is a Therapist, nutritional. Her husband accompanies her to this appointment.  No baseline GI symptoms. She has recently gained weight. She notes following a high fiber diet.   There is no known family history of colon cancer or polyps. No family history of stomach cancer or other GI malignancy. No family history of inflammatory bowel disease or celiac.    Past Medical History:  Diagnosis Date   Anemia    hx of years ago   Brain tumor (benign) ([place]) 1996   benign pituitary adenoma   Chickenpox    Hypertension    OSA (obstructive sleep apnea)    cpap night seizures    Past Surgical History:  Procedure Laterality Date   TRANSPHENOIDAL PITUITARY RESECTION  1996   benign pituitary adenoma   WISDOM TOOTH EXTRACTION       No current facility-administered medications for this encounter.    Allergies as of 09/08/2021 - Review Complete 09/02/2021  Allergen Reaction Noted   Aspirin Rash, Nausea And Vomiting, Shortness Of Breath, and Hives 08/10/2011   Amlodipine Other (See Comments) 01/06/2018   Shellfish allergy Swelling 09/02/2021   Vitamin d analogs Other (See Comments) 03/10/2021    Family History  Problem Relation Age of Onset   Hypertension Mother    Heart attack Father 10       result of DVT   Pulmonary embolism Father    Hypertension Maternal Aunt    Cancer Maternal Aunt        breast cancer   CVA Maternal Aunt 70   Stroke Maternal Aunt 70   Breast cancer Maternal Aunt    Hypertension Maternal  Uncle    Leukemia Maternal Grandmother    Colon cancer Neg Hx    Esophageal cancer Neg Hx    Liver cancer Neg Hx    Stomach cancer Neg Hx     Social History   Socioeconomic History   Marital status: Married    Spouse name: Not on file   Number of children: 0   Years of education: Not on file   Highest education level: Not on file  Occupational History   Not on file  Tobacco Use   Smoking status: Never   Smokeless tobacco: Never  Vaping Use   Vaping Use: Never used  Substance and Sexual Activity   Alcohol use: Never   Drug use: Never   Sexual activity: Yes  Other Topics Concern   Not on file  Social History Narrative   Not on file   Social Determinants of Health   Financial Resource Strain: Not on file  Food Insecurity: Not on file  Transportation Needs: Not on file  Physical Activity: Not on file  Stress: Not on file  Social Connections: Not on file  Intimate Partner Violence: Not on file    Review of Systems: 12 system ROS is negative except as noted above.   Physical Exam: General:   Alert,  well-nourished, pleasant and cooperative in NAD Head:  Normocephalic and atraumatic. Eyes:  Sclera  clear, no icterus.   Conjunctiva pink. Ears:  Normal auditory acuity. Nose:  No deformity, discharge,  or lesions. Mouth:  No deformity or lesions.   Neck:  Supple; no masses or thyromegaly. Lungs:  Clear throughout to auscultation.   No wheezes. Heart:  Regular rate and rhythm; no murmurs. Abdomen:  Soft, central obesity, nontender, nondistended, normal bowel sounds, no rebound or guarding. No hepatosplenomegaly.   Rectal:  Deferred  Msk:  Symmetrical. No boney deformities LAD: No inguinal or umbilical LAD Extremities:  No clubbing or edema. Neurologic:  Alert and  oriented x4;  grossly nonfocal Skin:  Intact without significant lesions or rashes. Psych:  Alert and cooperative. Normal mood and affect.    [place] Chelf L. Tarri Glenn, MD, MPH 11/23/2021, 7:38 AM

## 2021-11-23 NOTE — Anesthesia Preprocedure Evaluation (Signed)
Anesthesia Evaluation  Patient identified by MRN, date of birth, ID band Patient awake    Reviewed: Allergy & Precautions, NPO status , Patient's Chart, lab work & pertinent test results  Airway Mallampati: II  TM Distance: >3 FB Neck ROM: Full    Dental no notable dental hx.    Pulmonary sleep apnea ,    Pulmonary exam normal breath sounds clear to auscultation       Cardiovascular hypertension, Pt. on medications negative cardio ROS Normal cardiovascular exam Rhythm:Regular Rate:Normal     Neuro/Psych negative neurological ROS  negative psych ROS   GI/Hepatic negative GI ROS, Neg liver ROS,   Endo/Other  Morbid obesity  Renal/GU negative Renal ROS  negative genitourinary   Musculoskeletal negative musculoskeletal ROS (+)   Abdominal (+) + obese,   Peds negative pediatric ROS (+)  Hematology negative hematology ROS (+)   Anesthesia Other Findings   Reproductive/Obstetrics negative OB ROS                             Anesthesia Physical Anesthesia Plan  ASA: 3  Anesthesia Plan: MAC   Post-op Pain Management: Minimal or no pain anticipated   Induction: Intravenous  PONV Risk Score and Plan: 2 and Ondansetron, Midazolam and Treatment may vary due to age or medical condition  Airway Management Planned: Simple Face Mask  Additional Equipment:   Intra-op Plan:   Post-operative Plan:   Informed Consent: I have reviewed the patients History and Physical, chart, labs and discussed the procedure including the risks, benefits and alternatives for the proposed anesthesia with the patient or authorized representative who has indicated his/her understanding and acceptance.     Dental advisory given  Plan Discussed with: CRNA  Anesthesia Plan Comments:         Anesthesia Quick Evaluation

## 2021-11-24 LAB — SURGICAL PATHOLOGY

## 2021-11-25 ENCOUNTER — Encounter (HOSPITAL_COMMUNITY): Payer: Self-pay | Admitting: Gastroenterology

## 2021-12-02 ENCOUNTER — Ambulatory Visit: Payer: Federal, State, Local not specified - PPO | Admitting: Family Medicine

## 2021-12-04 ENCOUNTER — Encounter: Payer: Self-pay | Admitting: Gastroenterology

## 2021-12-08 ENCOUNTER — Other Ambulatory Visit: Payer: Self-pay

## 2021-12-08 MED ORDER — CHLORTHALIDONE 25 MG PO TABS: 25.0000 mg | ORAL_TABLET | Freq: Every day | ORAL | 0 refills | Status: DC

## 2021-12-14 DIAGNOSIS — G4733 Obstructive sleep apnea (adult) (pediatric): Secondary | ICD-10-CM | POA: Diagnosis not present

## 2022-01-07 ENCOUNTER — Ambulatory Visit: Payer: Federal, State, Local not specified - PPO | Attending: Cardiology | Admitting: Cardiology

## 2022-01-07 ENCOUNTER — Encounter: Payer: Self-pay | Admitting: Cardiology

## 2022-01-07 VITALS — BP 144/92 | HR 80 | Ht 63.0 in | Wt 302.4 lb

## 2022-01-07 DIAGNOSIS — I10 Essential (primary) hypertension: Secondary | ICD-10-CM

## 2022-01-07 MED ORDER — CHLORTHALIDONE 25 MG PO TABS: 25.0000 mg | ORAL_TABLET | Freq: Every day | ORAL | 3 refills | Status: DC

## 2022-01-07 NOTE — Progress Notes (Signed)
Cardiology Office Note:    Date:  01/07/2022   ID:  Sherry [name], DOB 03/26/1965, MRN [id number]  PCP:  Flossie Buffy, NP   [facility] Providers Cardiologist:  Kate Sable, MD     Referring MD: Flossie Buffy, NP   Chief Complaint  Patient presents with   Follow-up    6 month Follow up, had stopped and resumed HTN medications, after procedure began high readings again    History of Present Illness:    Sherry [name] is a 55 y.o. female with a hx of hypertension, OSA on CPAP, pituitary adenoma (s/p excision 1996-residual focus/neuro issues), who presents for follow-up.  Previously seen due to elevated BP and medication management.  Being seen for difficult to control blood pressures, and medication management.  Tolerating chlorthalidone 25 mg daily, has tried several other medications but not tolerating due to side effects.  For some reason, she stopped taking chlorthalidone for about a month, blood pressures worsened with systolics in the 502D.  She resumed chlorthalidone, blood pressures have average in the 741O to 878M systolic.  She would not want to add any medication at this time.  States having occasional fatigue but tolerating current dose of chlorthalidone.   Prior notes Echo 10/2019 EF 55 to 60%. Patient was diagnosed with hypertension in 2018.  She was initially started on amlodipine but developed muscle aches from this was stopped.  She eventually was started on lisinopril and Maxzide, she states developing facial acne which resolved in approximately 2 weeks after stopping lisinopril and Maxzide.  Irbesartan caused edema in her ankles.  Patient had a pituitary tumor, resected in 1996, developed some neurological issues with lack of focus, memory impairment.  Past Medical History:  Diagnosis Date   Anemia    hx of years ago   Brain tumor (benign) ([place]) 1996   benign pituitary adenoma   Chickenpox    Hypertension    OSA (obstructive sleep apnea)     cpap night seizures    Past Surgical History:  Procedure Laterality Date   COLONOSCOPY WITH PROPOFOL N/A 11/23/2021   Procedure: COLONOSCOPY WITH PROPOFOL;  Surgeon: Thornton Park, MD;  Location: [facility];  Service: Gastroenterology;  Laterality: N/A;   POLYPECTOMY  11/23/2021   Procedure: POLYPECTOMY;  Surgeon: Thornton Park, MD;  Location: [facility];  Service: Gastroenterology;;   TRANSPHENOIDAL PITUITARY RESECTION  1996   benign pituitary adenoma   WISDOM TOOTH EXTRACTION      Current Medications: Current Meds  Medication Sig   bromocriptine (PARLODEL) 2.5 MG tablet Take 2.5 mg by mouth at bedtime.   Cholecalciferol (VITAMIN D) 50 MCG (2000 UT) tablet Take 2,000 Units by mouth at bedtime.   Multiple Vitamins-Minerals (MULTIVITAMIN ADULT PO) Take 1 tablet by mouth at bedtime.   Omega-3 Fatty Acids (MINI OMEGA-3 BURP-LESS) 540 MG CAPS Take 540 mg by mouth at bedtime.   triamcinolone cream (KENALOG) 0.5 % APPLY 1 APPLICATION TOPICALLY TWICE DAILY (Patient taking differently: Apply 1 Application topically as needed (eczema).)   [DISCONTINUED] chlorthalidone (HYGROTON) 25 MG tablet Take 1 tablet (25 mg total) by mouth daily.     Allergies:   Aspirin, Amlodipine, Shellfish allergy, and Vitamin d analogs   Social History   Socioeconomic History   Marital status: Married    Spouse name: Not on file   Number of children: 0   Years of education: Not on file   Highest education level: Not on file  Occupational History   Not on file  Tobacco Use   Smoking status: Never   Smokeless tobacco: Never  Vaping Use   Vaping Use: Never used  Substance and Sexual Activity   Alcohol use: Never   Drug use: Never   Sexual activity: Yes  Other Topics Concern   Not on file  Social History Narrative   Not on file   Social Determinants of Health   Financial Resource Strain: Not on file  Food Insecurity: Not on file  Transportation Needs: Not on file  Physical Activity:  Not on file  Stress: Not on file  Social Connections: Not on file     Family History: The patient's family history includes Breast cancer in her maternal aunt; CVA (age of onset: 32) in her maternal aunt; Cancer in her maternal aunt; Heart attack (age of onset: 46) in her father; Hypertension in her maternal aunt, maternal uncle, and mother; Leukemia in her maternal grandmother; Pulmonary embolism in her father; Stroke (age of onset: 44) in her maternal aunt. There is no history of Colon cancer, Esophageal cancer, Liver cancer, or Stomach cancer.  ROS:   Please see the history of present illness.     All other systems reviewed and are negative.  EKGs/Labs/Other Studies Reviewed:    The following studies were reviewed today:   EKG:  EKG is ordered today.  EKG shows normal sinus rhythm.  Recent Labs: 03/10/2021: TSH 0.48 09/17/2021: BUN 10; Creatinine, Ser 0.81; Hemoglobin 14.3; Platelets 302.0; Potassium 4.1; Sodium 140  Recent Lipid Panel    Component Value Date/Time   CHOL 185 03/10/2021 1003   CHOL 196 09/12/2019 1459   TRIG 82.0 03/10/2021 1003   HDL 58.50 03/10/2021 1003   HDL 49 09/12/2019 1459   CHOLHDL 3 03/10/2021 1003   VLDL 16.4 03/10/2021 1003   LDLCALC 110 (H) 03/10/2021 1003   LDLCALC 128 (H) 09/12/2019 1459     Risk Assessment/Calculations:          Physical Exam:    VS:  BP (!) 144/92 (BP Location: Left Arm, Patient Position: Sitting, Cuff Size: Large)   Pulse 80   Ht '5\' 3"'$  (1.6 m)   Wt (!) 302 lb 6.4 oz (137.2 kg)   SpO2 99%   BMI 53.57 kg/m     Wt Readings from Last 3 Encounters:  01/07/22 (!) 302 lb 6.4 oz (137.2 kg)  11/23/21 (!) 303 lb 2.1 oz (137.5 kg)  09/17/21 (!) 303 lb 3.2 oz (137.5 kg)     GEN:  Well nourished, well developed in no acute distress HEENT: Normal NECK: No JVD; No carotid bruits CARDIAC: RRR, no murmurs, rubs, gallops RESPIRATORY:  Clear to auscultation without rales, wheezing or rhonchi  ABDOMEN: Soft, non-tender,  non-distended MUSCULOSKELETAL:  No edema; No deformity  SKIN: Warm and dry NEUROLOGIC:  Alert and oriented x 3 PSYCHIATRIC:  Normal affect   ASSESSMENT:    1. Primary hypertension   2. Morbid obesity ([place])    PLAN:    In order of problems listed above:  Hypertension, BP 361W to 431V systolic.  Patient will does not want to add any new medication at this time.  Continue chlorthalidone 25 mg daily.  May consider clonidine if BP stays elevated.   Did not tolerate lisinopril, Maxide, irbesartan, norvasc.  Morbid obesity, low-calorie diet, weight loss recommended.  Follow-up in 6 months.       Medication Adjustments/Labs and Tests Ordered: Current medicines are reviewed at length with the patient today.  Concerns regarding medicines are outlined  above.  Orders Placed This Encounter  Procedures   EKG 12-Lead   Meds ordered this encounter  Medications   chlorthalidone (HYGROTON) 25 MG tablet    Sig: Take 1 tablet (25 mg total) by mouth daily.    Dispense:  90 tablet    Refill:  3    Patient Instructions  Medication Instructions:   Your physician recommends that you continue on your current medications as directed. Please refer to the Current Medication list given to you today.  *If you need a refill on your cardiac medications before your next appointment, please call your pharmacy*   Lab Work:  None Ordered  If you have labs (blood work) drawn today and your tests are completely normal, you will receive your results only by: [place] (if you have MyChart) OR A paper copy in the mail If you have any lab test that is abnormal or we need to change your treatment, we will call you to review the results.   Testing/Procedures:  None Ordered   Follow-Up: At [facility], you and your health needs are our priority.  As part of our continuing mission to provide you with exceptional heart care, we have created designated Provider Care Teams.  These Care  Teams include your primary Cardiologist (physician) and Advanced Practice Providers (APPs -  Physician Assistants and Nurse Practitioners) who all work together to provide you with the care you need, when you need it.  We recommend signing up for the patient portal called "MyChart".  Sign up information is provided on this After Visit Summary.  MyChart is used to connect with patients for Virtual Visits (Telemedicine).  Patients are able to view lab/test results, encounter notes, upcoming appointments, etc.  Non-urgent messages can be sent to your provider as well.   To learn more about what you can do with MyChart, go to [email or web address].    Your next appointment:   6 month(s)  The format for your next appointment:   In Person  Provider:   Kate Sable, MD ONLY       Signed, Kate Sable, MD  01/07/2022 10:25 AM    [place]

## 2022-01-07 NOTE — Patient Instructions (Signed)
Medication Instructions:   Your physician recommends that you continue on your current medications as directed. Please refer to the Current Medication list given to you today.  *If you need a refill on your cardiac medications before your next appointment, please call your pharmacy*   Lab Work:  None Ordered  If you have labs (blood work) drawn today and your tests are completely normal, you will receive your results only by: [place] (if you have MyChart) OR A paper copy in the mail If you have any lab test that is abnormal or we need to change your treatment, we will call you to review the results.   Testing/Procedures:  None Ordered   Follow-Up: At [facility], you and your health needs are our priority.  As part of our continuing mission to provide you with exceptional heart care, we have created designated Provider Care Teams.  These Care Teams include your primary Cardiologist (physician) and Advanced Practice Providers (APPs -  Physician Assistants and Nurse Practitioners) who all work together to provide you with the care you need, when you need it.  We recommend signing up for the patient portal called "MyChart".  Sign up information is provided on this After Visit Summary.  MyChart is used to connect with patients for Virtual Visits (Telemedicine).  Patients are able to view lab/test results, encounter notes, upcoming appointments, etc.  Non-urgent messages can be sent to your provider as well.   To learn more about what you can do with MyChart, go to [email or web address].    Your next appointment:   6 month(s)  The format for your next appointment:   In Person  Provider:   Kate Sable, MD ONLY

## 2022-01-11 NOTE — Patient Instructions (Signed)

## 2022-01-11 NOTE — Progress Notes (Unsigned)
PATIENT: Sherry [name] DOB: 04/14/1965  REASON FOR VISIT: follow up HISTORY FROM: patient  No chief complaint on file.    HISTORY OF PRESENT ILLNESS:  01/12/21 ALL: Sherry [name] returns for follow up for OSA on CPAP.   01/13/2021 ALL: Sherry [name] returns for follow up for OSA on CPAP. She reports that she is doing very well. She reports CPAP has been the best thing that has ever happened to her. She sleeps very well. She doesn't have trouble with insomnia any longer. She is sleeping well. No concerns with machine or supplies.   BP has been elevated at home. Usually around 140-150/90's. She recently stopped lisinopril and Maxide and reports that readings are unchanged. She feels better off these medications. She is followed closely by PCP. She was last seen 07/2020.     02/20/2019 ALL:  Sherry [name] is a 56 y.o. female here today for follow up.  She is doing very well on CPAP therapy.  She reports that she cannot sleep without her CPAP machine.  She notes significant improvements in sleep quality as well as increased energy.  She is working on lifestyle changes.  She has recently started monitoring her intake of simple sugars.  She has lost at least 5 to 10 pounds.  She is feeling much more energized.  She feels that mental clarity is sharper.  She denies any seizure like activity.  She is followed by endocrinology.  Compliance report dated 01/20/2019 through 02/18/2019 reveals that she use CPAP every night for compliance of 100%.  Every night she used CPAP greater than 4 hours for compliance of 100%.  Average usage was 7 hours and 56 minutes.  Residual AHI was 1.3 on 5 to 13 cm of water and EPR of 3.  There was no significant leak noted.  BP 133/85 WT 300lbs.   History (copied from [place] note on 05/03/2018)  Sherry [name] is a 56 y.o. female with recent diagnosis of OSA and CPAP initiation. She was initially scheduled for face-to-face office visit today at this time for  initial CPAP compliance visit but due to [place], face-to-face office visit rescheduled for non-face-to-face telephone visit.     CPAP compliance report from 04/03/2018 -05/02/2018 shows 30 out of 30 usage days with 30 days greater than 4 hours for 100% compliance.  Average usage 9 hours and 1 minute with residual AHI 1.9.  Leaks in the 95th percentile 17.7.  Pressure in the 95th percentile 11.8 with minimum pressure 5 cm H2O and max pressure 13 cm H2O with a EPR 3.   She reports doing exceptionally well on CPAP with overall improvement of her energy level and sleep quality at night.  She denies any additional symptoms of vertigo or dizziness as she was having these symptoms prior to initiating CPAP.  She feels as though she is more energized during the day and is planning on getting into an exercise/workout routine as she had difficulty prior due to lack of energy.  She does have concerns regarding her current DME company [employer] as she has been experiencing difficulties reaching them and will have to make multiple phone calls.     Epworth Sleepiness Scale: 5/24 (today's visit) Epworth Sleepiness Scale: 11/24 (12/21/2017)   Of note, during titration study EEG abnormalities identified and discussion with Dr. Brett Fairy regarding potentially initiating AED but decided on using CPAP first as she does have a history of pituitary adenoma.  She states she followed up with her endocrinologist who recommended  not initiating AED unless repeat sleep study or EEG obtained to assess for additional seizure activity or abnormalities.   REVIEW OF SYSTEMS: Out of a complete 14 system review of symptoms, the patient complains only of the following symptoms, none and all other reviewed systems are negative.  ESS: 7  ALLERGIES: Allergies  Allergen Reactions   Aspirin Rash, Nausea And Vomiting, Shortness Of Breath and Hives   Amlodipine Other (See Comments)    Muscle pain   Shellfish Allergy Swelling   Vitamin D  Analogs Other (See Comments)    Prescription strength pt counldn't focus, had the shakes and her cognitive skills were off balance     HOME MEDICATIONS: Outpatient Medications Prior to Visit  Medication Sig Dispense Refill   bromocriptine (PARLODEL) 2.5 MG tablet Take 2.5 mg by mouth at bedtime.     chlorthalidone (HYGROTON) 25 MG tablet Take 1 tablet (25 mg total) by mouth daily. 90 tablet 3   Cholecalciferol (VITAMIN D) 50 MCG (2000 UT) tablet Take 2,000 Units by mouth at bedtime.     doxycycline (VIBRAMYCIN) 100 MG capsule Take 1 capsule (100 mg total) by mouth 2 (two) times daily. (Patient not taking: Reported on 11/18/2021) 20 capsule 0   Multiple Vitamins-Minerals (MULTIVITAMIN ADULT PO) Take 1 tablet by mouth at bedtime.     Omega-3 Fatty Acids (MINI OMEGA-3 BURP-LESS) 540 MG CAPS Take 540 mg by mouth at bedtime.     triamcinolone cream (KENALOG) 0.5 % APPLY 1 APPLICATION TOPICALLY TWICE DAILY (Patient taking differently: Apply 1 Application topically as needed (eczema).) 454 g 1   No facility-administered medications prior to visit.    PAST MEDICAL HISTORY: Past Medical History:  Diagnosis Date   Anemia    hx of years ago   Brain tumor (benign) ([place]) 1996   benign pituitary adenoma   Chickenpox    Hypertension    OSA (obstructive sleep apnea)    cpap night seizures    PAST SURGICAL HISTORY: Past Surgical History:  Procedure Laterality Date   COLONOSCOPY WITH PROPOFOL N/A 11/23/2021   Procedure: COLONOSCOPY WITH PROPOFOL;  Surgeon: Thornton Park, MD;  Location: [facility];  Service: Gastroenterology;  Laterality: N/A;   POLYPECTOMY  11/23/2021   Procedure: POLYPECTOMY;  Surgeon: Thornton Park, MD;  Location: [facility];  Service: Gastroenterology;;   TRANSPHENOIDAL PITUITARY RESECTION  1996   benign pituitary adenoma   WISDOM TOOTH EXTRACTION      FAMILY HISTORY: Family History  Problem Relation Age of Onset   Hypertension Mother    Heart attack  Father 22       result of DVT   Pulmonary embolism Father    Hypertension Maternal Aunt    Cancer Maternal Aunt        breast cancer   CVA Maternal Aunt 70   Stroke Maternal Aunt 70   Breast cancer Maternal Aunt    Hypertension Maternal Uncle    Leukemia Maternal Grandmother    Colon cancer Neg Hx    Esophageal cancer Neg Hx    Liver cancer Neg Hx    Stomach cancer Neg Hx     SOCIAL HISTORY: Social History   Socioeconomic History   Marital status: Married    Spouse name: Not on file   Number of children: 0   Years of education: Not on file   Highest education level: Not on file  Occupational History   Not on file  Tobacco Use   Smoking status: Never   Smokeless tobacco:  Never  Vaping Use   Vaping Use: Never used  Substance and Sexual Activity   Alcohol use: Never   Drug use: Never   Sexual activity: Yes  Other Topics Concern   Not on file  Social History Narrative   Not on file   Social Determinants of Health   Financial Resource Strain: Not on file  Food Insecurity: Not on file  Transportation Needs: Not on file  Physical Activity: Not on file  Stress: Not on file  Social Connections: Not on file  Intimate Partner Violence: Not on file     PHYSICAL EXAM  There were no vitals filed for this visit.  There is no height or weight on file to calculate BMI.  Generalized: Well developed, in no acute distress  Cardiology: normal rate and rhythm, no murmur noted Respiratory: clear to auscultation bilaterally  Neurological examination  Mentation: Alert oriented to time, place, history taking. Follows all commands speech and language fluent Cranial nerve II-XII: Pupils were equal round reactive to light. Extraocular movements were full, visual field were full  Motor: The motor testing reveals 5 over 5 strength of all 4 extremities. Good symmetric motor tone is noted throughout.  Gait and station: Gait is normal.    DIAGNOSTIC DATA (LABS, IMAGING,  TESTING) - I reviewed patient records, labs, notes, testing and imaging myself where available.      No data to display           Lab Results  Component Value Date   WBC 8.9 09/17/2021   HGB 14.3 09/17/2021   HCT 44.0 09/17/2021   MCV 95.4 09/17/2021   PLT 302.0 09/17/2021      Component Value Date/Time   NA 140 09/17/2021 1409   NA 143 10/21/2020 1138   K 4.1 09/17/2021 1409   CL 106 09/17/2021 1409   CO2 23 09/17/2021 1409   GLUCOSE 71 09/17/2021 1409   BUN 10 09/17/2021 1409   BUN 9 10/21/2020 1138   CREATININE 0.81 09/17/2021 1409   CALCIUM 9.3 09/17/2021 1409   PROT 6.9 09/12/2019 1459   ALBUMIN 3.9 09/12/2019 1459   AST 14 09/12/2019 1459   ALT 18 09/12/2019 1459   ALKPHOS 96 09/12/2019 1459   BILITOT 0.3 09/12/2019 1459   GFRNONAA 79 10/25/2019 0903   GFRAA 91 10/25/2019 0903   Lab Results  Component Value Date   CHOL 185 03/10/2021   HDL 58.50 03/10/2021   LDLCALC 110 (H) 03/10/2021   TRIG 82.0 03/10/2021   CHOLHDL 3 03/10/2021   Lab Results  Component Value Date   HGBA1C 6.2 09/17/2021   Lab Results  Component Value Date   VITAMINB12 924 06/15/2018   Lab Results  Component Value Date   TSH 0.48 03/10/2021     ASSESSMENT AND PLAN 56 y.o. year old female  has a past medical history of Anemia, Brain tumor (benign) ([place]) (1996), Chickenpox, Hypertension, and OSA (obstructive sleep apnea). here with   No diagnosis found.     Sherry [name] is doing well on CPAP therapy. Compliance report reveals excellent compliance. She was encouraged to continue using CPAP nightly and for greater than 4 hours each night. We will update supply orders as indicated. Risks of untreated sleep apnea review and education materials provided. I have reviewed her BP reading and educated her on the importance of BP management. She is asymptomatic, today. Red flag warnings reviewed and she is aware of when to seek emergency medical attention. Education material provided  in AVS. She will contact PCP to discuss asap. Healthy lifestyle habits encouraged. She will follow up in 1 year, sooner if needed. She verbalizes understanding and agreement with this plan.    No orders of the defined types were placed in this encounter.     No orders of the defined types were placed in this encounter.      Debbora Presto, FNP-C 01/11/2022, 4:14 PM [facility] [address], [place], [place] [address] [phone]

## 2022-01-13 ENCOUNTER — Encounter: Payer: Self-pay | Admitting: Family Medicine

## 2022-01-13 ENCOUNTER — Telehealth: Payer: Self-pay

## 2022-01-13 ENCOUNTER — Ambulatory Visit: Payer: Federal, State, Local not specified - PPO | Admitting: Family Medicine

## 2022-01-13 VITALS — BP 165/107 | HR 91 | Ht 63.0 in | Wt 306.5 lb

## 2022-01-13 DIAGNOSIS — G4733 Obstructive sleep apnea (adult) (pediatric): Secondary | ICD-10-CM

## 2022-01-27 ENCOUNTER — Ambulatory Visit (INDEPENDENT_AMBULATORY_CARE_PROVIDER_SITE_OTHER): Payer: Federal, State, Local not specified - PPO | Admitting: Family Medicine

## 2022-01-27 ENCOUNTER — Encounter: Payer: Self-pay | Admitting: Family Medicine

## 2022-01-27 VITALS — BP 133/88 | HR 89 | Wt 305.0 lb

## 2022-01-27 DIAGNOSIS — N939 Abnormal uterine and vaginal bleeding, unspecified: Secondary | ICD-10-CM | POA: Diagnosis not present

## 2022-01-27 NOTE — Progress Notes (Signed)
   Subjective:    Patient ID: Sherry [name] is a 56 y.o. female presenting with Follow-up  on 01/27/2022  HPI: Here for f/u after long h/o AUB. Noted to have prolactinoma and on Bromocriptine. Late menarche and f/h late menopause. She is s/p EMB x 2 and both were normal. Reports that she is feeling better. Has seen Neurology and she needs to sleep more. She is sleeping 8hrs/night. She is feeling fatigues. She is no longer bleeding so heavily. She thinks she is pre-menopausal. She does report that she is having regular cycles and having them monthly. It is not heavy and is darker and has an associated odor.  She is starting to have some hot flashes.  Review of Systems  Constitutional:  Negative for chills and fever.  Respiratory:  Negative for shortness of breath.   Cardiovascular:  Negative for chest pain.  Gastrointestinal:  Negative for abdominal pain, nausea and vomiting.  Genitourinary:  Negative for dysuria.  Skin:  Negative for rash.      Objective:    BP (!) 150/94   Pulse 97   Wt (!) 305 lb (138.3 kg)   LMP 01/20/2022 (Approximate)   BMI 54.03 kg/m  Physical Exam Exam conducted with a chaperone present.  Constitutional:      General: She is not in acute distress.    Appearance: She is well-developed.  HENT:     Head: Normocephalic and atraumatic.  Eyes:     General: No scleral icterus. Cardiovascular:     Rate and Rhythm: Normal rate.  Pulmonary:     Effort: Pulmonary effort is normal.  Abdominal:     Palpations: Abdomen is soft.  Musculoskeletal:     Cervical back: Neck supple.  Skin:    General: Skin is warm and dry.  Neurological:     Mental Status: She is alert and oriented to person, place, and time.         Assessment & Plan:   Problem List Items Addressed This Visit       Unprioritized   Abnormal uterine bleeding - Primary    Seems to have improved on its own--suspect peri-menopause. Consider D & C with further abnormal bleeding.        Return in about 6 months (around 07/29/2022).  [name], MD 01/27/2022 10:10 AM

## 2022-01-27 NOTE — Assessment & Plan Note (Signed)
Seems to have improved on its own--suspect peri-menopause. Consider D & C with further abnormal bleeding.

## 2022-01-27 NOTE — Progress Notes (Signed)
RGYN patient presents for F/U today per notes.   Last seen 09/02/21 for AUB.  Had Endometrial Bx that was WNL .  Pt states she has not had any irregular bleeding anymore.

## 2022-02-13 DIAGNOSIS — G4733 Obstructive sleep apnea (adult) (pediatric): Secondary | ICD-10-CM | POA: Diagnosis not present

## 2022-03-19 DIAGNOSIS — G4733 Obstructive sleep apnea (adult) (pediatric): Secondary | ICD-10-CM | POA: Diagnosis not present

## 2022-03-29 ENCOUNTER — Other Ambulatory Visit
Admission: RE | Admit: 2022-03-29 | Discharge: 2022-03-29 | Disposition: A | Discharge status: Home / Self Care | Payer: Federal, State, Local not specified - PPO | Attending: Endocrinology | Admitting: Endocrinology

## 2022-03-29 DIAGNOSIS — D352 Benign neoplasm of pituitary gland: Secondary | ICD-10-CM | POA: Diagnosis not present

## 2022-03-30 LAB — PROLACTIN: Prolactin: 12 ng/mL (ref 3.6–25.2)

## 2022-04-02 DIAGNOSIS — D352 Benign neoplasm of pituitary gland: Secondary | ICD-10-CM | POA: Diagnosis not present

## 2022-04-02 DIAGNOSIS — I10 Essential (primary) hypertension: Secondary | ICD-10-CM | POA: Diagnosis not present

## 2022-04-17 DIAGNOSIS — G4733 Obstructive sleep apnea (adult) (pediatric): Secondary | ICD-10-CM | POA: Diagnosis not present

## 2022-05-18 DIAGNOSIS — G4733 Obstructive sleep apnea (adult) (pediatric): Secondary | ICD-10-CM | POA: Diagnosis not present

## 2022-06-18 DIAGNOSIS — G4733 Obstructive sleep apnea (adult) (pediatric): Secondary | ICD-10-CM | POA: Diagnosis not present

## 2022-07-08 ENCOUNTER — Encounter: Payer: Self-pay | Admitting: Family Medicine

## 2022-07-08 ENCOUNTER — Ambulatory Visit: Payer: Federal, State, Local not specified - PPO | Admitting: Family Medicine

## 2022-07-08 VITALS — BP 144/87 | HR 89 | Wt 306.0 lb

## 2022-07-08 DIAGNOSIS — N939 Abnormal uterine and vaginal bleeding, unspecified: Secondary | ICD-10-CM

## 2022-07-08 MED ORDER — MEDROXYPROGESTERONE ACETATE 10 MG PO TABS: 10.0000 mg | ORAL_TABLET | Freq: Every day | ORAL | 1 refills | Status: DC

## 2022-07-08 NOTE — Assessment & Plan Note (Signed)
Discussed options at length. Given her history and on-going nature, will check u/s and proceed with D & C with hysteroscopy and HTA. Will need progesterone x 4 weeks prior to procedure. Risks include but are not limited to bleeding, infection, injury to surrounding structures, including bowel, bladder and ureters, blood clots, and death.  Likelihood of success is high.

## 2022-07-08 NOTE — Progress Notes (Signed)
RGYN pt here for F/U.  Pt had bleeding with clots after last visit in 01/2022. Pt has log og bleeding Last episode of heavy bleeding was 06/28/22  Notes fatigue,bloating.  No bleeding today.

## 2022-07-08 NOTE — Progress Notes (Signed)
   Subjective:    Patient ID: Sherry [name] is a 57 y.o. female presenting with Follow-up  on 07/08/2022  HPI: Patient has h/o AUB that is long standing. She has h/o prolactinoma and late menarche and FH of late menopause. EMB neg x 2 last in 8/23. She thought she was in peri-menopause. When tapers off has rotten smell. Reports a lot of bloating. Bled in 12.23-- Bled 2/9-3/26 Bled through April and in May--had blood clots during this time.  Review of Systems  Constitutional:  Negative for chills and fever.  Respiratory:  Negative for shortness of breath.   Cardiovascular:  Negative for chest pain.  Gastrointestinal:  Negative for abdominal pain, nausea and vomiting.  Genitourinary:  Negative for dysuria.  Skin:  Negative for rash.      Objective:    BP (!) 144/87   Pulse 89   Wt (!) 306 lb (138.8 kg)   BMI 54.21 kg/m  Physical Exam Exam conducted with a chaperone present.  Constitutional:      General: She is not in acute distress.    Appearance: She is well-developed.  HENT:     Head: Normocephalic and atraumatic.  Eyes:     General: No scleral icterus. Cardiovascular:     Rate and Rhythm: Normal rate.  Pulmonary:     Effort: Pulmonary effort is normal.  Abdominal:     Palpations: Abdomen is soft.  Musculoskeletal:     Cervical back: Neck supple.  Skin:    General: Skin is warm and dry.  Neurological:     Mental Status: She is alert and oriented to person, place, and time.         Assessment & Plan:   Problem List Items Addressed This Visit       Unprioritized   Abnormal uterine bleeding - Primary    Discussed options at length. Given her history and on-going nature, will check u/s and proceed with D & C with hysteroscopy and HTA. Will need progesterone x 4 weeks prior to procedure. Risks include but are not limited to bleeding, infection, injury to surrounding structures, including bowel, bladder and ureters, blood clots, and death.  Likelihood of  success is high.       Relevant Medications   medroxyPROGESTERone (PROVERA) 10 MG tablet   Other Relevant Orders   US PELVIC COMPLETE WITH TRANSVAGINAL   CBC   TSH   Follicle stimulating hormone     Return in about 3 months (around 10/08/2022) for postop check.  Reva Bores, MD 07/08/2022 8:58 AM

## 2022-07-09 ENCOUNTER — Ambulatory Visit: Payer: Federal, State, Local not specified - PPO | Attending: Cardiology | Admitting: Cardiology

## 2022-07-09 ENCOUNTER — Encounter: Payer: Self-pay | Admitting: Cardiology

## 2022-07-09 VITALS — BP 144/98 | HR 84 | Wt 309.2 lb

## 2022-07-09 DIAGNOSIS — I10 Essential (primary) hypertension: Secondary | ICD-10-CM

## 2022-07-09 LAB — CBC
Hematocrit: 47 % — ABNORMAL HIGH (ref 34.0–46.6)
Hemoglobin: 15.4 g/dL (ref 11.1–15.9)
MCH: 30.8 pg (ref 26.6–33.0)
MCHC: 32.8 g/dL (ref 31.5–35.7)
MCV: 94 fL (ref 79–97)
Platelets: 335 10*3/uL (ref 150–450)
RBC: 5 x10E6/uL (ref 3.77–5.28)
RDW: 13.8 % (ref 11.7–15.4)
WBC: 12.9 10*3/uL — ABNORMAL HIGH (ref 3.4–10.8)

## 2022-07-09 LAB — TSH: TSH: 1.08 u[IU]/mL (ref 0.450–4.500)

## 2022-07-09 LAB — FOLLICLE STIMULATING HORMONE: FSH: 16.8 m[IU]/mL

## 2022-07-09 NOTE — Progress Notes (Signed)
Cardiology Office Note:    Date:  07/09/2022   ID:  Sherry [name], DOB [birth year], MRN [id number]  PCP:  Anne Ng, NP   [facility] Providers Cardiologist:  Debbe Odea, MD     Referring MD: Anne Ng, NP   Chief Complaint  Patient presents with   Follow-up    Patient denies new or acute cardiac problems/concerns today.      History of Present Illness:    Sherry [name] is a 57 y.o. female with a hx of hypertension, OSA on CPAP, pituitary adenoma (s/p excision 1996-residual focus/neuro issues), who presents for follow-up.   Compliant with chlorthalidone as prescribed, states taking chlorthalidone at night prior to going to sleep.  Has been able to tolerate medication with no significant adverse effects.  Blood pressures have improved currently in the 130s to 140s systolic.  Prior notes Echo 10/2019 EF 55 to 60%. Patient was diagnosed with hypertension in 2018.  She was initially started on amlodipine but developed muscle aches from this was stopped.  She eventually was started on lisinopril and Maxzide, she states developing facial acne which resolved in approximately 2 weeks after stopping lisinopril and Maxzide.  Irbesartan caused edema in her ankles.  Patient had a pituitary tumor, resected in 1996, developed some neurological issues with lack of focus, memory impairment.  Past Medical History:  Diagnosis Date   Anemia    hx of years ago   Brain tumor (benign) (HCC) 1996   benign pituitary adenoma   Chickenpox    Hypertension    OSA (obstructive sleep apnea)    cpap night seizures    Past Surgical History:  Procedure Laterality Date   COLONOSCOPY WITH PROPOFOL N/A 11/23/2021   Procedure: COLONOSCOPY WITH PROPOFOL;  Surgeon: Tressia Danas, MD;  Location: [facility];  Service: Gastroenterology;  Laterality: N/A;   POLYPECTOMY  11/23/2021   Procedure: POLYPECTOMY;  Surgeon: Tressia Danas, MD;  Location: [facility];  Service:  Gastroenterology;;   TRANSPHENOIDAL PITUITARY RESECTION  1996   benign pituitary adenoma   WISDOM TOOTH EXTRACTION      Current Medications: Current Meds  Medication Sig   bromocriptine (PARLODEL) 2.5 MG tablet Take 2.5 mg by mouth at bedtime.   chlorthalidone (HYGROTON) 25 MG tablet Take 1 tablet (25 mg total) by mouth daily.   Cholecalciferol (VITAMIN D) 50 MCG (2000 UT) tablet Take 2,000 Units by mouth at bedtime.   doxycycline (VIBRAMYCIN) 100 MG capsule Take 1 capsule (100 mg total) by mouth 2 (two) times daily.   medroxyPROGESTERone (PROVERA) 10 MG tablet Take 1 tablet (10 mg total) by mouth daily.   Multiple Vitamins-Minerals (MULTIVITAMIN ADULT PO) Take 1 tablet by mouth at bedtime.   Omega-3 Fatty Acids (MINI OMEGA-3 BURP-LESS) 540 MG CAPS Take 540 mg by mouth at bedtime.   triamcinolone cream (KENALOG) 0.5 % APPLY 1 APPLICATION TOPICALLY TWICE DAILY (Patient taking differently: Apply 1 Application topically as needed (eczema).)     Allergies:   Aspirin, Amlodipine, Shellfish allergy, and Vitamin d analogs   Social History   Socioeconomic History   Marital status: Married    Spouse name: Not on file   Number of children: 0   Years of education: Not on file   Highest education level: Not on file  Occupational History   Not on file  Tobacco Use   Smoking status: Never   Smokeless tobacco: Never  Vaping Use   Vaping Use: Never used  Substance and Sexual Activity   Alcohol  use: Never   Drug use: Never   Sexual activity: Yes  Other Topics Concern   Not on file  Social History Narrative   Not on file   Social Determinants of Health   Financial Resource Strain: Not on file  Food Insecurity: Not on file  Transportation Needs: Not on file  Physical Activity: Not on file  Stress: Not on file  Social Connections: Not on file     Family History: The patient's family history includes Breast cancer in her maternal aunt; CVA (age of onset: 45) in her maternal aunt;  Cancer in her maternal aunt; Heart attack (age of onset: 72) in her father; Hypertension in her maternal aunt, maternal uncle, and mother; Leukemia in her maternal grandmother; Pulmonary embolism in her father; Stroke (age of onset: 62) in her maternal aunt. There is no history of Colon cancer, Esophageal cancer, Liver cancer, or Stomach cancer.  ROS:   Please see the history of present illness.     All other systems reviewed and are negative.  EKGs/Labs/Other Studies Reviewed:    The following studies were reviewed today:   EKG:  EKG is ordered today.  EKG shows normal sinus rhythm.  Recent Labs: 09/17/2021: BUN 10; Creatinine, Ser 0.81; Potassium 4.1; Sodium 140 07/08/2022: Hemoglobin 15.4; Platelets 335; TSH 1.080  Recent Lipid Panel    Component Value Date/Time   CHOL 185 03/10/2021 1003   CHOL 196 09/12/2019 1459   TRIG 82.0 03/10/2021 1003   HDL 58.50 03/10/2021 1003   HDL 49 09/12/2019 1459   CHOLHDL 3 03/10/2021 1003   VLDL 16.4 03/10/2021 1003   LDLCALC 110 (H) 03/10/2021 1003   LDLCALC 128 (H) 09/12/2019 1459     Risk Assessment/Calculations:          Physical Exam:    VS:  BP (!) 144/98 (BP Location: Left Arm, Patient Position: Sitting, Cuff Size: Large)   Pulse 84   Wt (!) 309 lb 3.2 oz (140.3 kg)   SpO2 97%   BMI 54.77 kg/m     Wt Readings from Last 3 Encounters:  07/09/22 (!) 309 lb 3.2 oz (140.3 kg)  07/08/22 (!) 306 lb (138.8 kg)  01/27/22 (!) 305 lb (138.3 kg)     GEN:  Well nourished, well developed in no acute distress HEENT: Normal NECK: No JVD; No carotid bruits CARDIAC: RRR, no murmurs, rubs, gallops RESPIRATORY:  Clear to auscultation without rales, wheezing or rhonchi  ABDOMEN: Soft, non-tender, non-distended MUSCULOSKELETAL:  No edema; No deformity  SKIN: Warm and dry NEUROLOGIC:  Alert and oriented x 3 PSYCHIATRIC:  Normal affect   ASSESSMENT:    1. Primary hypertension   2. Morbid obesity (HCC)     PLAN:    In order of  problems listed above:  Hypertension, BP much improved. 130s to 140s systolic.  Declined adding any new medication at this time.  Continue chlorthalidone 25 mg daily.  May consider clonidine if BP stays elevated.   Did not tolerate lisinopril, Maxide, irbesartan, norvasc.  Morbid obesity, low-calorie diet, weight loss recommended.  Follow-up in 12 months.       Medication Adjustments/Labs and Tests Ordered: Current medicines are reviewed at length with the patient today.  Concerns regarding medicines are outlined above.  Orders Placed This Encounter  Procedures   EKG 12-Lead   No orders of the defined types were placed in this encounter.   Patient Instructions  Medication Instructions:   Your physician recommends that you continue on your  current medications as directed. Please refer to the Current Medication list given to you today.  *If you need a refill on your cardiac medications before your next appointment, please call your pharmacy*   Lab Work:  None Ordered  If you have labs (blood work) drawn today and your tests are completely normal, you will receive your results only by: MyChart Message (if you have MyChart) OR A paper copy in the mail If you have any lab test that is abnormal or we need to change your treatment, we will call you to review the results.   Testing/Procedures:  None Ordered   Follow-Up: At [facility], you and your health needs are our priority.  As part of our continuing mission to provide you with exceptional heart care, we have created designated Provider Care Teams.  These Care Teams include your primary Cardiologist (physician) and Advanced Practice Providers (APPs -  Physician Assistants and Nurse Practitioners) who all work together to provide you with the care you need, when you need it.  We recommend signing up for the patient portal called "MyChart".  Sign up information is provided on this After Visit Summary.  MyChart is used  to connect with patients for Virtual Visits (Telemedicine).  Patients are able to view lab/test results, encounter notes, upcoming appointments, etc.  Non-urgent messages can be sent to your provider as well.   To learn more about what you can do with MyChart, go to [email or web address].    Your next appointment:   12 month(s)  Provider:   You may see Debbe Odea, MD or one of the following Advanced Practice Providers on your designated Care Team:   Nicolasa Ducking, NP Eula Listen, PA-C Cadence Fransico Michael, PA-C Charlsie Quest, NP   Signed, Debbe Odea, MD  07/09/2022 9:15 AM    [facility]

## 2022-07-09 NOTE — Patient Instructions (Signed)
Medication Instructions:   Your physician recommends that you continue on your current medications as directed. Please refer to the Current Medication list given to you today.  *If you need a refill on your cardiac medications before your next appointment, please call your pharmacy*   Lab Work:  None Ordered  If you have labs (blood work) drawn today and your tests are completely normal, you will receive your results only by: MyChart Message (if you have MyChart) OR A paper copy in the mail If you have any lab test that is abnormal or we need to change your treatment, we will call you to review the results.   Testing/Procedures:  None Ordered    Follow-Up: At [facility], you and your health needs are our priority.  As part of our continuing mission to provide you with exceptional heart care, we have created designated Provider Care Teams.  These Care Teams include your primary Cardiologist (physician) and Advanced Practice Providers (APPs -  Physician Assistants and Nurse Practitioners) who all work together to provide you with the care you need, when you need it.  We recommend signing up for the patient portal called "MyChart".  Sign up information is provided on this After Visit Summary.  MyChart is used to connect with patients for Virtual Visits (Telemedicine).  Patients are able to view lab/test results, encounter notes, upcoming appointments, etc.  Non-urgent messages can be sent to your provider as well.   To learn more about what you can do with MyChart, go to [email or web address].    Your next appointment:   12 month(s)  Provider:   You may see Brian Agbor-Etang, MD or one of the following Advanced Practice Providers on your designated Care Team:   Christopher Berge, NP Ryan Dunn, PA-C Cadence Furth, PA-C Sheri Hammock, NP  

## 2022-07-19 DIAGNOSIS — G4733 Obstructive sleep apnea (adult) (pediatric): Secondary | ICD-10-CM | POA: Diagnosis not present

## 2022-07-27 ENCOUNTER — Encounter: Payer: Self-pay | Admitting: Nurse Practitioner

## 2022-07-27 ENCOUNTER — Ambulatory Visit: Payer: Federal, State, Local not specified - PPO | Admitting: Nurse Practitioner

## 2022-07-27 VITALS — BP 122/83 | HR 82 | Temp 98.6°F | Resp 16 | Ht 63.0 in | Wt 309.4 lb

## 2022-07-27 DIAGNOSIS — E1169 Type 2 diabetes mellitus with other specified complication: Secondary | ICD-10-CM | POA: Diagnosis not present

## 2022-07-27 DIAGNOSIS — D500 Iron deficiency anemia secondary to blood loss (chronic): Secondary | ICD-10-CM | POA: Diagnosis not present

## 2022-07-27 DIAGNOSIS — E876 Hypokalemia: Secondary | ICD-10-CM

## 2022-07-27 DIAGNOSIS — E229 Hyperfunction of pituitary gland, unspecified: Secondary | ICD-10-CM | POA: Diagnosis not present

## 2022-07-27 DIAGNOSIS — E7800 Pure hypercholesterolemia, unspecified: Secondary | ICD-10-CM

## 2022-07-27 DIAGNOSIS — I152 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: Secondary | ICD-10-CM | POA: Diagnosis not present

## 2022-07-27 DIAGNOSIS — D353 Benign neoplasm of craniopharyngeal duct: Secondary | ICD-10-CM | POA: Diagnosis not present

## 2022-07-27 DIAGNOSIS — D72829 Elevated white blood cell count, unspecified: Secondary | ICD-10-CM

## 2022-07-27 DIAGNOSIS — R7303 Prediabetes: Secondary | ICD-10-CM

## 2022-07-27 DIAGNOSIS — L2082 Flexural eczema: Secondary | ICD-10-CM

## 2022-07-27 DIAGNOSIS — D352 Benign neoplasm of pituitary gland: Secondary | ICD-10-CM | POA: Diagnosis not present

## 2022-07-27 DIAGNOSIS — E785 Hyperlipidemia, unspecified: Secondary | ICD-10-CM

## 2022-07-27 DIAGNOSIS — R1013 Epigastric pain: Secondary | ICD-10-CM | POA: Diagnosis not present

## 2022-07-27 DIAGNOSIS — E119 Type 2 diabetes mellitus without complications: Secondary | ICD-10-CM | POA: Insufficient documentation

## 2022-07-27 DIAGNOSIS — Z91013 Allergy to seafood: Secondary | ICD-10-CM

## 2022-07-27 LAB — HEMOGLOBIN A1C: Hgb A1c MFr Bld: 6.5 % (ref 4.6–6.5)

## 2022-07-27 LAB — LIPID PANEL
Cholesterol: 220 mg/dL — ABNORMAL HIGH (ref 0–200)
HDL: 53.7 mg/dL (ref >39.00)
LDL Cholesterol: 143 mg/dL — ABNORMAL HIGH (ref 0–99)
NonHDL: 166.7
Total CHOL/HDL Ratio: 4
Triglycerides: 117 mg/dL (ref 0.0–149.0)
VLDL: 23.4 mg/dL (ref 0.0–40.0)

## 2022-07-27 LAB — IBC + FERRITIN
Ferritin: 49 ng/mL (ref 10.0–291.0)
Iron: 89 ug/dL (ref 42–145)
Saturation Ratios: 24.7 % (ref 20.0–50.0)
TIBC: 359.8 ug/dL (ref 250.0–450.0)
Transferrin: 257 mg/dL (ref 212.0–360.0)

## 2022-07-27 LAB — BASIC METABOLIC PANEL
BUN: 10 mg/dL (ref 6–23)
CO2: 30 mEq/L (ref 19–32)
Calcium: 9.8 mg/dL (ref 8.4–10.5)
Chloride: 100 mEq/L (ref 96–112)
Creatinine, Ser: 0.78 mg/dL (ref 0.40–1.20)
GFR: 84.28 mL/min (ref >60.00)
Glucose, Bld: 100 mg/dL — ABNORMAL HIGH (ref 70–99)
Potassium: 3.2 mEq/L — ABNORMAL LOW (ref 3.5–5.1)
Sodium: 140 mEq/L (ref 135–145)

## 2022-07-27 MED ORDER — EPINEPHRINE 0.3 MG/0.3ML IJ SOAJ: 0.3000 mg | INTRAMUSCULAR | 0 refills | Status: AC | PRN

## 2022-07-27 MED ORDER — TRIAMCINOLONE ACETONIDE 0.5 % EX CREA: TOPICAL_CREAM | CUTANEOUS | 1 refills | Status: DC

## 2022-07-27 NOTE — Patient Instructions (Addendum)
Go to lab [facility] to use antacid as needed. If negative H. Pylori, will need to decrease fish oil dose to 2-3x/week. Maintain current medications Increase daily exercise: of walking

## 2022-07-27 NOTE — Assessment & Plan Note (Signed)
Check hgbA1c: 6.5% recommend appointment with nutritionist to help with your diet. Are you ok with referral to nutritionist? I also recommend daily exercise: daily

## 2022-07-27 NOTE — Progress Notes (Signed)
Established Patient Visit  Patient: Sherry [name]   DOB: [birth year]   57 y.o. Female  MRN: [id number] Visit Date: 07/27/2022  Subjective:    Chief Complaint  Patient presents with   Follow-up   HPI Accompanied by husband.  Benign neoplasm of pituitary gland and craniopharyngeal duct (HCC) Under the care of neurology-Amy Lomax NP and endocrinology-Dr. Janeal Holmes Current use of bromocriptine  Flexural eczema Refill on triamcinolone cream  Hyperpituitarism (HCC) Under the care of endocrinology: Dr. 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 Azucena Cecil BP Readings from Last 3 Encounters:  07/27/22 122/83  07/09/22 (!) 144/98  07/08/22 (!) 144/87    Repeat BMP  Dyspepsia Postprandial ABDOMEN pain, nausea and bloating, heartburn Takes fish oil supplement for last 1yrs.(Use due to dry eye) No constipation and No diarrhea Use of maalox prn  Possibly due to use of fish oil supplement? Check H.pylori today start use of prilosec or pepcid x 14days   Iron deficiency anemia due to chronic blood loss Repeat iron panel and cbc  Prediabetes Check hgbA1c  Wt Readings from Last 3 Encounters:  07/27/22 (!) 309 lb 6.4 oz (140.3 kg)  07/09/22 (!) 309 lb 3.2 oz (140.3 kg)  07/08/22 (!) 306 lb (138.8 kg)    Reviewed medical, surgical, and social history today  Medications: Outpatient Medications Prior to Visit  Medication Sig   bromocriptine (PARLODEL) 2.5 MG tablet Take 2.5 mg by mouth at bedtime.   chlorthalidone (HYGROTON) 25 MG tablet Take 1 tablet (25 mg total) by mouth daily.   Cholecalciferol (VITAMIN D) 50 MCG (2000 UT) tablet Take 2,000 Units by mouth at bedtime.   doxycycline (VIBRAMYCIN) 100 MG capsule Take 1 capsule (100 mg total) by mouth 2 (two) times daily.   medroxyPROGESTERone (PROVERA) 10 MG tablet Take 1 tablet (10 mg total) by mouth daily.    Multiple Vitamins-Minerals (MULTIVITAMIN ADULT PO) Take 1 tablet by mouth at bedtime.   Omega-3 Fatty Acids (MINI OMEGA-3 BURP-LESS) 540 MG CAPS Take 540 mg by mouth at bedtime.   [DISCONTINUED] triamcinolone cream (KENALOG) 0.5 % APPLY 1 APPLICATION TOPICALLY TWICE DAILY (Patient taking differently: Apply 1 Application topically as needed (eczema).)   No facility-administered medications prior to visit.   Reviewed past medical and social history.   ROS per HPI above      Objective:  BP 122/83 (BP Location: Left Arm, Patient Position: Sitting, Cuff Size: Large)   Pulse 82   Temp 98.6 F (37 C) (Temporal)   Resp 16   Ht 5\' 3"  (1.6 m)   Wt (!) 309 lb 6.4 oz (140.3 kg)   SpO2 96%   BMI 54.81 kg/m      Physical Exam Cardiovascular:     Rate and Rhythm: Normal rate and regular rhythm.     Pulses: Normal pulses.     Heart sounds: Normal heart sounds.  Pulmonary:     Effort: Pulmonary effort is normal.     Breath sounds: Normal breath sounds.  Musculoskeletal:     Right lower leg: Edema present.     Left lower leg: Edema present.  Neurological:     Mental Status: She is alert and oriented to person, place, and time.     No results found for any visits on 07/27/22.    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 Azucena Cecil BP Readings from Last 3 Encounters:  07/27/22 122/83  07/09/22 (!) 144/98  07/08/22 (!) 144/87    Repeat BMP      Relevant Medications   EPINEPHrine (EPIPEN 2-PAK) 0.3 mg/0.3 mL IJ SOAJ injection   Other Relevant Orders   Basic metabolic panel     Endocrine   Benign neoplasm of pituitary gland and craniopharyngeal duct (HCC)    Under the care of neurology-Amy Lomax NP and endocrinology-Dr. Janeal Holmes Current use of bromocriptine      Hyperpituitarism (HCC)    Under the  care of endocrinology: Dr. Janeal Holmes Current use of Bromicriptine        Musculoskeletal and Integument   Flexural eczema    Refill on triamcinolone cream      Relevant Medications   triamcinolone cream (KENALOG) 0.5 %     Other   Dyspepsia    Postprandial ABDOMEN pain, nausea and bloating, heartburn Takes fish oil supplement for last 20yrs.(Use due to dry eye) No constipation and No diarrhea Use of maalox prn  Possibly due to use of fish oil supplement? Check H.pylori today start use of prilosec or pepcid x 14days       Relevant Orders   H. pylori breath test   Iron deficiency anemia due to chronic blood loss    Repeat iron panel and cbc      Relevant Orders   IBC + Ferritin   Prediabetes    Check hgbA1c      Relevant Orders   Hemoglobin A1c   Other Visit Diagnoses     Leukocytosis, unspecified type    -  Primary   Shellfish allergy       Elevated LDL cholesterol level       Relevant Orders   Lipid panel      Return in about 3 months (around 10/27/2022) for CPE (fasting).     Alysia Penna, NP

## 2022-07-27 NOTE — Assessment & Plan Note (Signed)
Under the care of endocrinology: Dr. Janeal Holmes Current use of Bromicriptine

## 2022-07-27 NOTE — Assessment & Plan Note (Signed)
Under the care of neurology-Amy Lomax NP and endocrinology-Dr. Janeal Holmes Current use of bromocriptine

## 2022-07-27 NOTE — Assessment & Plan Note (Signed)
Postprandial ABDOMEN pain, nausea and bloating, heartburn Takes fish oil supplement for last 73yrs.(Use due to dry eye) No constipation and No diarrhea Use of maalox prn  Possibly due to use of fish oil supplement? Check H.pylori today start use of prilosec or pepcid x 14days

## 2022-07-27 NOTE — Assessment & Plan Note (Signed)
Bp at goal with chlorthialidone Under the care of cardiology-Dr. Azucena Cecil BP Readings from Last 3 Encounters:  07/27/22 122/83  07/09/22 (!) 144/98  07/08/22 (!) 144/87    Repeat BMP

## 2022-07-27 NOTE — Assessment & Plan Note (Signed)
Refill on triamcinolone cream

## 2022-07-27 NOTE — Assessment & Plan Note (Signed)
Repeat iron panel and cbc 

## 2022-07-29 DIAGNOSIS — E1169 Type 2 diabetes mellitus with other specified complication: Secondary | ICD-10-CM | POA: Insufficient documentation

## 2022-07-29 MED ORDER — POTASSIUM CHLORIDE CRYS ER 20 MEQ PO TBCR: 20.0000 meq | EXTENDED_RELEASE_TABLET | Freq: Every day | ORAL | 0 refills | Status: DC

## 2022-07-29 NOTE — Addendum Note (Signed)
Addended by: Alysia Penna L on: 07/29/2022 10:14 AM   Modules accepted: Orders

## 2022-07-29 NOTE — Progress Notes (Signed)
Abnormal: hgbA1c at 6.5%: this means you are considered diabetic at this time. I recommend appointment with nutritionist to help with your diet. Are you ok with referral to nutritionist? I also recommend daily exercise: daily Mild decline in potassium due to use of chlorthalidone. Sent potassium supplement Persistent abnormal lipid panel: your risk of developing cardiovascular disease over the next 73yrs is at 12.4%. this is high, so I recommend starting atorvastatin 10mg  in PM. Are you ok with this prescription? Normal renal function Schedule 3months f/up appointment with me

## 2022-07-29 NOTE — Assessment & Plan Note (Signed)
Persistent abnormal lipid panel: your risk of developing cardiovascular disease over the next 25yrs is at 12.4%. this is high, so I recommend starting atorvastatin 10mg  in PM

## 2022-07-30 DIAGNOSIS — G5782 Other specified mononeuropathies of left lower limb: Secondary | ICD-10-CM | POA: Diagnosis not present

## 2022-08-02 ENCOUNTER — Telehealth: Payer: Self-pay | Admitting: Family Medicine

## 2022-08-02 NOTE — Telephone Encounter (Signed)
Patient called asking about the medication that was sent in on her 6/6 visit with you (Provera), she is wanting to know if the medication is a necessity for her prior to her procedure and if so is it going to interact with the Bromocriptine that she already takes.    Thank you   Please advise

## 2022-08-03 ENCOUNTER — Ambulatory Visit
Admission: RE | Admit: 2022-08-03 | Discharge: 2022-08-03 | Disposition: A | Discharge status: Home / Self Care | Payer: Federal, State, Local not specified - PPO | Source: Ambulatory Visit | Attending: Family Medicine | Admitting: Family Medicine

## 2022-08-03 DIAGNOSIS — N939 Abnormal uterine and vaginal bleeding, unspecified: Secondary | ICD-10-CM

## 2022-08-03 DIAGNOSIS — D259 Leiomyoma of uterus, unspecified: Secondary | ICD-10-CM | POA: Diagnosis not present

## 2022-08-03 LAB — H. PYLORI BREATH TEST: H. pylori Breath Test: NOT DETECTED

## 2022-08-03 NOTE — Telephone Encounter (Signed)
Pt advised and verbalized understanding.

## 2022-08-09 ENCOUNTER — Telehealth: Payer: Self-pay | Admitting: *Deleted

## 2022-08-09 NOTE — Telephone Encounter (Signed)
Pt called and left a voicemail, that she did not want to go forward with the procedure as she was not comfortable with the medication too. She is still bleeding but not as bad and wants to watch it for a few months. Will inform Dr Shawnie Pons.

## 2022-08-18 DIAGNOSIS — G4733 Obstructive sleep apnea (adult) (pediatric): Secondary | ICD-10-CM | POA: Diagnosis not present

## 2022-09-09 DIAGNOSIS — G609 Hereditary and idiopathic neuropathy, unspecified: Secondary | ICD-10-CM | POA: Insufficient documentation

## 2022-09-16 DIAGNOSIS — G4733 Obstructive sleep apnea (adult) (pediatric): Secondary | ICD-10-CM | POA: Diagnosis not present

## 2022-09-18 DIAGNOSIS — G4733 Obstructive sleep apnea (adult) (pediatric): Secondary | ICD-10-CM | POA: Diagnosis not present

## 2022-09-21 ENCOUNTER — Encounter: Payer: Federal, State, Local not specified - PPO | Admitting: Nurse Practitioner

## 2022-10-17 DIAGNOSIS — G4733 Obstructive sleep apnea (adult) (pediatric): Secondary | ICD-10-CM | POA: Diagnosis not present

## 2022-10-25 ENCOUNTER — Other Ambulatory Visit
Admission: RE | Admit: 2022-10-25 | Discharge: 2022-10-25 | Disposition: A | Discharge status: Home / Self Care | Payer: Federal, State, Local not specified - PPO | Attending: Endocrinology | Admitting: Endocrinology

## 2022-10-25 DIAGNOSIS — D352 Benign neoplasm of pituitary gland: Secondary | ICD-10-CM | POA: Diagnosis not present

## 2022-10-25 DIAGNOSIS — D353 Benign neoplasm of craniopharyngeal duct: Secondary | ICD-10-CM | POA: Diagnosis not present

## 2022-10-25 LAB — CORTISOL: Cortisol, Plasma: 11.3 ug/dL

## 2022-10-25 LAB — TSH: TSH: 1.641 u[IU]/mL (ref 0.350–4.500)

## 2022-10-26 LAB — INSULIN-LIKE GROWTH FACTOR: Somatomedin C: 177 ng/mL (ref 60–207)

## 2022-10-26 LAB — PROLACTIN: Prolactin: 11.4 ng/mL (ref 3.6–25.2)

## 2022-10-28 ENCOUNTER — Ambulatory Visit (INDEPENDENT_AMBULATORY_CARE_PROVIDER_SITE_OTHER): Payer: Federal, State, Local not specified - PPO | Admitting: Nurse Practitioner

## 2022-10-28 ENCOUNTER — Encounter: Payer: Self-pay | Admitting: Nurse Practitioner

## 2022-10-28 VITALS — BP 156/97 | HR 83 | Temp 98.2°F | Ht 63.0 in | Wt 301.4 lb

## 2022-10-28 DIAGNOSIS — Z0001 Encounter for general adult medical examination with abnormal findings: Secondary | ICD-10-CM | POA: Diagnosis not present

## 2022-10-28 DIAGNOSIS — I152 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: Secondary | ICD-10-CM | POA: Diagnosis not present

## 2022-10-28 DIAGNOSIS — E1169 Type 2 diabetes mellitus with other specified complication: Secondary | ICD-10-CM | POA: Diagnosis not present

## 2022-10-28 DIAGNOSIS — R1013 Epigastric pain: Secondary | ICD-10-CM | POA: Diagnosis not present

## 2022-10-28 DIAGNOSIS — E876 Hypokalemia: Secondary | ICD-10-CM | POA: Diagnosis not present

## 2022-10-28 DIAGNOSIS — Z794 Long term (current) use of insulin: Secondary | ICD-10-CM

## 2022-10-28 DIAGNOSIS — E785 Hyperlipidemia, unspecified: Secondary | ICD-10-CM | POA: Diagnosis not present

## 2022-10-28 DIAGNOSIS — Z1231 Encounter for screening mammogram for malignant neoplasm of breast: Secondary | ICD-10-CM

## 2022-10-28 LAB — POCT GLYCOSYLATED HEMOGLOBIN (HGB A1C)
HbA1c POC (<> result, manual entry): 6.3 % (ref 4.0–5.6)
HbA1c, POC (controlled diabetic range): 6.3 % (ref 0.0–7.0)
HbA1c, POC (prediabetic range): 6.3 % (ref 5.7–6.4)
Hemoglobin A1C: 6.3 % — AB (ref 4.0–5.6)

## 2022-10-28 LAB — MICROALBUMIN / CREATININE URINE RATIO
Creatinine,U: 172.4 mg/dL
Microalb Creat Ratio: 0.6 mg/g (ref 0.0–30.0)
Microalb, Ur: 1 mg/dL (ref 0.0–1.9)

## 2022-10-28 NOTE — Assessment & Plan Note (Addendum)
Reports resolved epigastric and LUQ discomfort, was associated with nausea and bloating which have also resolved. She thinks this was due to chlorthalidone. She now takes med EOD. Denies any constipation or diarrhea Last colonoscopy 2023: RECTUM, POLYPECTOMY:Tubular adenoma. No high grade dysplasia or malignancy.

## 2022-10-28 NOTE — Patient Instructions (Signed)
Go to lab Continue Heart healthy diet and daily exercise. Maintain current medications.  Preventive Care 55-57 Years Old, Female Preventive care refers to lifestyle choices and visits with your health care provider that can promote health and wellness. Preventive care visits are also called wellness exams. What can I expect for my preventive care visit? Counseling Your health care provider may ask you questions about your: Medical history, including: Past medical problems. Family medical history. Pregnancy history. Current health, including: Menstrual cycle. Method of birth control. Emotional well-being. Home life and relationship well-being. Sexual activity and sexual health. Lifestyle, including: Alcohol, nicotine or tobacco, and drug use. Access to firearms. Diet, exercise, and sleep habits. Work and work Astronomer. Sunscreen use. Safety issues such as seatbelt and bike helmet use. Physical exam Your health care provider will check your: Height and weight. These may be used to calculate your BMI (body mass index). BMI is a measurement that tells if you are at a healthy weight. Waist circumference. This measures the distance around your waistline. This measurement also tells if you are at a healthy weight and may help predict your risk of certain diseases, such as type 2 diabetes and high blood pressure. Heart rate and blood pressure. Body temperature. Skin for abnormal spots. What immunizations do I need?  Vaccines are usually given at various ages, according to a schedule. Your health care provider will recommend vaccines for you based on your age, medical history, and lifestyle or other factors, such as travel or where you work. What tests do I need? Screening Your health care provider may recommend screening tests for certain conditions. This may include: Lipid and cholesterol levels. Diabetes screening. This is done by checking your blood sugar (glucose) after you  have not eaten for a while (fasting). Pelvic exam and Pap test. Hepatitis B test. Hepatitis C test. HIV (human immunodeficiency virus) test. STI (sexually transmitted infection) testing, if you are at risk. Lung cancer screening. Colorectal cancer screening. Mammogram. Talk with your health care provider about when you should start having regular mammograms. This may depend on whether you have a family history of breast cancer. BRCA-related cancer screening. This may be done if you have a family history of breast, ovarian, tubal, or peritoneal cancers. Bone density scan. This is done to screen for osteoporosis. Talk with your health care provider about your test results, treatment options, and if necessary, the need for more tests. Follow these instructions at home: Eating and drinking  Eat a diet that includes fresh fruits and vegetables, whole grains, lean protein, and low-fat dairy products. Take vitamin and mineral supplements as recommended by your health care provider. Do not drink alcohol if: Your health care provider tells you not to drink. You are pregnant, may be pregnant, or are planning to become pregnant. If you drink alcohol: Limit how much you have to 0-1 drink a day. Know how much alcohol is in your drink. In the [place] one drink equals one 12 oz bottle of beer (355 mL), one 5 oz glass of wine (148 mL), or one 1 oz glass of hard liquor (44 mL). Lifestyle Brush your teeth every morning and night with fluoride toothpaste. Floss one time each day. Exercise for at least 30 minutes 5 or more days each week. Do not use any products that contain nicotine or tobacco. These products include cigarettes, chewing tobacco, and vaping devices, such as e-cigarettes. If you need help quitting, ask your health care provider. Do not use drugs. If you are  sexually active, practice safe sex. Use a condom or other form of protection to prevent STIs. If you do not wish to become pregnant, use  a form of birth control. If you plan to become pregnant, see your health care provider for a prepregnancy visit. Take aspirin only as told by your health care provider. Make sure that you understand how much to take and what form to take. Work with your health care provider to find out whether it is safe and beneficial for you to take aspirin daily. Find healthy ways to manage stress, such as: Meditation, yoga, or listening to music. Journaling. Talking to a trusted person. Spending time with friends and family. Minimize exposure to UV radiation to reduce your risk of skin cancer. Safety Always wear your seat belt while driving or riding in a vehicle. Do not drive: If you have been drinking alcohol. Do not ride with someone who has been drinking. When you are tired or distracted. While texting. If you have been using any mind-altering substances or drugs. Wear a helmet and other protective equipment during sports activities. If you have firearms in your house, make sure you follow all gun safety procedures. Seek help if you have been physically or sexually abused. What's next? Visit your health care provider once a year for an annual wellness visit. Ask your health care provider how often you should have your eyes and teeth checked. Stay up to date on all vaccines. This information is not intended to replace advice given to you by your health care provider. Make sure you discuss any questions you have with your health care provider. Document Revised: 07/16/2020 Document Reviewed: 07/16/2020 Elsevier Patient Education  2024 [employer].

## 2022-10-28 NOTE — Progress Notes (Signed)
Complete physical exam  Patient: Sherry [name]   DOB: [birth year]   57 y.o. Female  MRN: [id number] Visit Date: 10/28/2022  Subjective:    Chief Complaint  Patient presents with   Annual Exam    With fasting lab work, no concerns    Accompanied by Husband  Sherry [name] is a 57 y.o. female who presents today for a complete physical exam. She reports consuming a general diet.  Occasional walking  She generally feels well. She reports sleeping well. She does have additional problems to discuss today.  Vision:No Dental:Yes STD Screen:No  BP Readings from Last 3 Encounters:  10/28/22 (!) 156/97  07/27/22 122/83  07/09/22 (!) 144/98   Wt Readings from Last 3 Encounters:  10/28/22 (!) 301 lb 6.4 oz (136.7 kg)  07/27/22 (!) 309 lb 6.4 oz (140.3 kg)  07/09/22 (!) 309 lb 3.2 oz (140.3 kg)   Most recent fall risk assessment:    10/28/2022    8:29 AM  Fall Risk   Falls in the past year? 1  Number falls in past yr: 1  Injury with Fall? 0  Risk for fall due to : History of fall(s)  Follow up Falls evaluation completed   Depression screen:Yes - No Depression  Most recent depression screenings:    10/28/2022    9:08 AM 07/27/2022   10:04 AM  PHQ 2/9 Scores  PHQ - 2 Score 2 0  PHQ- 9 Score 3    HPI  DM (diabetes mellitus) (HCC) Repeat hgbA1c: 6.3% improved Normal foot exam today Collected UACr today Advised to maintain low carb/low sugar diet and daily exercise Advised to schedule DIABETES eye exam   Dyspepsia Reports resolved epigastric and LUQ discomfort, was associated with nausea and bloating which have also resolved. She thinks this was due to chlorthalidone. She now takes med EOD. Denies any constipation or diarrhea Last colonoscopy 2023: RECTUM, POLYPECTOMY:Tubular adenoma. No high grade dysplasia or malignancy.     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 BP Readings from Last 3 Encounters:  10/28/22 (!) 156/97  07/27/22 122/83  07/09/22 (!) 144/98    Advised to f/up with cardiology   Past Medical History:  Diagnosis Date   Anemia    hx of years ago   Brain tumor (benign) (HCC) 1996   benign pituitary adenoma   Chickenpox    Hypertension    OSA (obstructive sleep apnea)    cpap night seizures   Past Surgical History:  Procedure Laterality Date   COLONOSCOPY WITH PROPOFOL N/A 11/23/2021   Procedure: COLONOSCOPY WITH PROPOFOL;  Surgeon: Tressia Danas, MD;  Location: [facility];  Service: Gastroenterology;  Laterality: N/A;   POLYPECTOMY  11/23/2021   Procedure: POLYPECTOMY;  Surgeon: Tressia Danas, MD;  Location: [facility];  Service: Gastroenterology;;   TRANSPHENOIDAL PITUITARY RESECTION  1996   benign pituitary adenoma   WISDOM TOOTH EXTRACTION     Social History   Socioeconomic History   Marital status: Married    Spouse name: Not on file   Number of children: 0   Years of education: Not on file   Highest education level: Not on file  Occupational History   Not on file  Tobacco Use   Smoking status: Never   Smokeless tobacco: Never  Vaping Use   Vaping status: Never Used  Substance and Sexual Activity   Alcohol  use: Never   Drug use: Never   Sexual activity: Yes  Other Topics Concern   Not on file  Social History Narrative   Not on file   Social Determinants of Health   Financial Resource Strain: Not on file  Food Insecurity: Not on file  Transportation Needs: Not on file  Physical Activity: Not on file  Stress: Not on file  Social Connections: Not on file  Intimate Partner Violence: Not on file   Family Status  Relation Name Status   Mother  Alive   Father  Deceased   Mat Aunt  Alive   Mat Uncle  Alive   MGM  Deceased   MGF  Deceased   PGM  Deceased   PGF  Deceased   Neg Hx  (Not Specified)  No partnership data on file   Family History   Problem Relation Age of Onset   Hypertension Mother    Heart attack Father 24       result of DVT   Pulmonary embolism Father    Hypertension Maternal Aunt    Cancer Maternal Aunt        breast cancer   CVA Maternal Aunt 73   Stroke Maternal Aunt 45   Breast cancer Maternal Aunt    Hypertension Maternal Uncle    Leukemia Maternal Grandmother    Colon cancer Neg Hx    Esophageal cancer Neg Hx    Liver cancer Neg Hx    Stomach cancer Neg Hx    Allergies  Allergen Reactions   Aspirin Rash, Nausea And Vomiting, Shortness Of Breath and Hives   Amlodipine Other (See Comments)    Muscle pain   Vitamin D Analogs Other (See Comments)    Prescription strength pt counldn't focus, had the shakes and her cognitive skills were off balance    Shellfish Allergy Swelling and Rash    Patient Care Team: Arturo Sofranko, Bonna Gains, NP as PCP - General (Internal Medicine) Debbe Odea, MD as PCP - Cardiology (Cardiology)   Medications: Outpatient Medications Prior to Visit  Medication Sig   bromocriptine (PARLODEL) 2.5 MG tablet Take 2.5 mg by mouth at bedtime.   chlorthalidone (HYGROTON) 25 MG tablet Take 1 tablet (25 mg total) by mouth daily. (Patient taking differently: Take 25 mg by mouth every other day.)   Cholecalciferol (VITAMIN D) 50 MCG (2000 UT) tablet Take 2,000 Units by mouth at bedtime.   EPINEPHrine (EPIPEN 2-PAK) 0.3 mg/0.3 mL IJ SOAJ injection Inject 0.3 mg into the muscle as needed for anaphylaxis.   Multiple Vitamins-Minerals (MULTIVITAMIN ADULT PO) Take 1 tablet by mouth at bedtime.   Omega-3 Fatty Acids (MINI OMEGA-3 BURP-LESS) 540 MG CAPS Take 540 mg by mouth at bedtime.   triamcinolone cream (KENALOG) 0.5 % APPLY 1 APPLICATION TOPICALLY TWICE DAILY   [DISCONTINUED] doxycycline (VIBRAMYCIN) 100 MG capsule Take 1 capsule (100 mg total) by mouth 2 (two) times daily. (Patient not taking: Reported on 10/28/2022)   [DISCONTINUED] medroxyPROGESTERone (PROVERA) 10 MG tablet Take 1  tablet (10 mg total) by mouth daily. (Patient not taking: Reported on 10/28/2022)   [DISCONTINUED] potassium chloride SA (KLOR-CON M) 20 MEQ tablet Take 1 tablet (20 mEq total) by mouth daily. (Patient not taking: Reported on 10/28/2022)   No facility-administered medications prior to visit.   Review of Systems  Constitutional:  Negative for activity change, appetite change and unexpected weight change.  Respiratory: Negative.    Cardiovascular: Negative.   Gastrointestinal: Negative.   Endocrine: Negative for  cold intolerance and heat intolerance.  Genitourinary: Negative.   Musculoskeletal: Negative.   Skin: Negative.   Neurological: Negative.   Hematological: Negative.   Psychiatric/Behavioral:  Negative for behavioral problems, decreased concentration, dysphoric mood, hallucinations, self-injury, sleep disturbance and suicidal ideas. The patient is not nervous/anxious.    Last metabolic panel Lab Results  Component Value Date   GLUCOSE 100 (H) 07/27/2022   NA 140 07/27/2022   K 3.2 (L) 07/27/2022   CL 100 07/27/2022   CO2 30 07/27/2022   BUN 10 07/27/2022   CREATININE 0.78 07/27/2022   GFR 84.28 07/27/2022   CALCIUM 9.8 07/27/2022   PROT 6.9 09/12/2019   ALBUMIN 3.9 09/12/2019   LABGLOB 3.0 09/12/2019   AGRATIO 1.3 09/12/2019   BILITOT 0.3 09/12/2019   ALKPHOS 96 09/12/2019   AST 14 09/12/2019   ALT 18 09/12/2019   Last lipids Lab Results  Component Value Date   CHOL 220 (H) 07/27/2022   HDL 53.70 07/27/2022   LDLCALC 143 (H) 07/27/2022   TRIG 117.0 07/27/2022   CHOLHDL 4 07/27/2022   Last hemoglobin A1c Lab Results  Component Value Date   HGBA1C 6.3 (A) 10/28/2022   HGBA1C 6.3 10/28/2022   HGBA1C 6.3 10/28/2022   HGBA1C 6.3 10/28/2022   Last thyroid functions Lab Results  Component Value Date   TSH 1.641 10/25/2022   Last vitamin D Lab Results  Component Value Date   VD25OH 61.4 09/12/2019      Objective:  BP (!) 156/97 (BP Location: Right Arm)    Pulse 83   Temp 98.2 F (36.8 C)   Ht 5\' 3"  (1.6 m)   Wt (!) 301 lb 6.4 oz (136.7 kg)   SpO2 97%   BMI 53.39 kg/m     Physical Exam Vitals and nursing note reviewed.  Constitutional:      General: She is not in acute distress. HENT:     Right Ear: Tympanic membrane, ear canal and external ear normal.     Left Ear: Tympanic membrane, ear canal and external ear normal.     Nose: Nose normal.  Eyes:     Extraocular Movements: Extraocular movements intact.     Conjunctiva/sclera: Conjunctivae normal.     Pupils: Pupils are equal, round, and reactive to light.  Neck:     Thyroid: No thyroid mass, thyromegaly or thyroid tenderness.  Cardiovascular:     Rate and Rhythm: Normal rate and regular rhythm.     Pulses: Normal pulses.     Heart sounds: Normal heart sounds.  Pulmonary:     Effort: Pulmonary effort is normal.     Breath sounds: Normal breath sounds.  Abdominal:     General: Bowel sounds are normal.     Palpations: Abdomen is soft.  Musculoskeletal:        General: Normal range of motion.     Cervical back: Normal range of motion and neck supple.     Right lower leg: No edema.     Left lower leg: No edema.  Lymphadenopathy:     Cervical: No cervical adenopathy.  Skin:    General: Skin is warm and dry.  Neurological:     Mental Status: She is alert and oriented to person, place, and time.     Cranial Nerves: No cranial nerve deficit.  Psychiatric:        Mood and Affect: Mood normal.        Behavior: Behavior normal.  Thought Content: Thought content normal.      Results for orders placed or performed in visit on 10/28/22  POCT glycosylated hemoglobin (Hb A1C)  Result Value Ref Range   Hemoglobin A1C 6.3 (A) 4.0 - 5.6 %   HbA1c POC (<> result, manual entry) 6.3 4.0 - 5.6 %   HbA1c, POC (prediabetic range) 6.3 5.7 - 6.4 %   HbA1c, POC (controlled diabetic range) 6.3 0.0 - 7.0 %      Assessment & Plan:    Routine Health Maintenance and Physical  Exam  Immunization History  Administered Date(s) Administered   Tdap 09/22/2018   Health Maintenance  Topic Date Due   OPHTHALMOLOGY EXAM  Never done   Diabetic kidney evaluation - Urine ACR  Never done   COVID-19 Vaccine (1 - 2023-24 season) 11/13/2022 (Originally 10/03/2022)   Zoster Vaccines- Shingrix (1 of 2) 01/27/2023 (Originally 02/04/2015)   INFLUENZA VACCINE  05/02/2023 (Originally 09/02/2022)   Hepatitis C Screening  07/27/2023 (Originally 02/04/1983)   MAMMOGRAM  12/03/2022   HEMOGLOBIN A1C  04/27/2023   Diabetic kidney evaluation - eGFR measurement  07/27/2023   FOOT EXAM  10/28/2023   Cervical Cancer Screening (HPV/Pap Cotest)  10/21/2025   DTaP/Tdap/Td (2 - Td or Tdap) 09/21/2028   Colonoscopy  11/24/2031   HIV Screening  Completed   HPV VACCINES  Aged Out   Discussed health benefits of physical activity, and encouraged her to engage in regular exercise appropriate for her age and condition.  Problem List Items Addressed This Visit     DM (diabetes mellitus) (HCC)    Repeat hgbA1c: 6.3% improved Normal foot exam today Collected UACr today Advised to maintain low carb/low sugar diet and daily exercise Advised to schedule DIABETES eye exam       Relevant Orders   Microalbumin / creatinine urine ratio   POCT glycosylated hemoglobin (Hb A1C) (Completed)   Ambulatory referral to Ophthalmology   Dyspepsia    Reports resolved epigastric and LUQ discomfort, was associated with nausea and bloating which have also resolved. She thinks this was due to chlorthalidone. She now takes med EOD. Denies any constipation or diarrhea Last colonoscopy 2023: RECTUM, POLYPECTOMY:Tubular adenoma. No high grade dysplasia or malignancy.         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 BP Readings from Last 3 Encounters:  10/28/22 (!) 156/97  07/27/22 122/83  07/09/22 (!) 144/98    Advised to f/up with cardiology      Other Visit Diagnoses     Encounter for preventative adult health care exam with abnormal findings    -  Primary   Relevant Orders   Comprehensive metabolic panel   Breast cancer screening by mammogram       Relevant Orders   MM 3D SCREENING MAMMOGRAM BILATERAL BREAST      Return in about 3 months (around 01/27/2023) for DM, hyperlipidemia (fasting).     Alysia Penna, NP

## 2022-10-28 NOTE — Assessment & Plan Note (Addendum)
Repeat lipid panel: Continuous increase in total cholesterol and LDL. This means you are at 25% risk of developing heart disease in the next 71yrs. I strongly recommend to start crestor 10mg  daily. New rx sent.

## 2022-10-28 NOTE — Assessment & Plan Note (Signed)
BP continues to be elevated due to med non compliance She has decreased chlorthalidone dose to EOD due to dyspepsia with daily dose. BP Readings from Last 3 Encounters:  10/28/22 (!) 156/97  07/27/22 122/83  07/09/22 (!) 144/98    Advised to f/up with cardiology

## 2022-10-28 NOTE — Assessment & Plan Note (Addendum)
Repeat hgbA1c: 6.3% improved Normal foot exam today Collected UACr today Advised to maintain low carb/low sugar diet and daily exercise Advised to schedule DIABETES eye exam

## 2022-10-30 DIAGNOSIS — D353 Benign neoplasm of craniopharyngeal duct: Secondary | ICD-10-CM | POA: Diagnosis not present

## 2022-10-30 DIAGNOSIS — I10 Essential (primary) hypertension: Secondary | ICD-10-CM | POA: Diagnosis not present

## 2022-10-30 DIAGNOSIS — D352 Benign neoplasm of pituitary gland: Secondary | ICD-10-CM | POA: Diagnosis not present

## 2022-11-02 ENCOUNTER — Other Ambulatory Visit
Admission: RE | Admit: 2022-11-02 | Discharge: 2022-11-02 | Disposition: A | Discharge status: Home / Self Care | Payer: Federal, State, Local not specified - PPO | Attending: Nurse Practitioner | Admitting: Nurse Practitioner

## 2022-11-02 DIAGNOSIS — E785 Hyperlipidemia, unspecified: Secondary | ICD-10-CM | POA: Diagnosis not present

## 2022-11-02 DIAGNOSIS — E1169 Type 2 diabetes mellitus with other specified complication: Secondary | ICD-10-CM | POA: Insufficient documentation

## 2022-11-02 DIAGNOSIS — Z0001 Encounter for general adult medical examination with abnormal findings: Secondary | ICD-10-CM | POA: Insufficient documentation

## 2022-11-02 LAB — COMPREHENSIVE METABOLIC PANEL
ALT: 18 U/L (ref 0–44)
AST: 19 U/L (ref 15–41)
Albumin: 3.5 g/dL (ref 3.5–5.0)
Alkaline Phosphatase: 76 U/L (ref 38–126)
Anion gap: 11 (ref 5–15)
BUN: 10 mg/dL (ref 6–20)
CO2: 25 mmol/L (ref 22–32)
Calcium: 9.1 mg/dL (ref 8.9–10.3)
Chloride: 101 mmol/L (ref 98–111)
Creatinine, Ser: 0.92 mg/dL (ref 0.44–1.00)
GFR, Estimated: >60 mL/min (ref >60)
Glucose, Bld: 94 mg/dL (ref 70–99)
Potassium: 3.3 mmol/L — ABNORMAL LOW (ref 3.5–5.1)
Sodium: 137 mmol/L (ref 135–145)
Total Bilirubin: 0.8 mg/dL (ref 0.3–1.2)
Total Protein: 7.4 g/dL (ref 6.5–8.1)

## 2022-11-02 LAB — LIPID PANEL
Cholesterol: 224 mg/dL — ABNORMAL HIGH (ref 0–200)
HDL: 57 mg/dL (ref >40)
LDL Cholesterol: 150 mg/dL — ABNORMAL HIGH (ref 0–99)
Total CHOL/HDL Ratio: 3.9 {ratio}
Triglycerides: 87 mg/dL (ref <150)
VLDL: 17 mg/dL (ref 0–40)

## 2022-11-02 MED ORDER — POTASSIUM CHLORIDE CRYS ER 20 MEQ PO TBCR
20.0000 meq | EXTENDED_RELEASE_TABLET | Freq: Every day | ORAL | 5 refills | Status: DC
Start: 2022-11-02 — End: 2023-07-07

## 2022-11-02 MED ORDER — ROSUVASTATIN CALCIUM 10 MG PO TABS
10.0000 mg | ORAL_TABLET | Freq: Every day | ORAL | 3 refills | Status: DC
Start: 2022-11-02 — End: 2023-07-07

## 2022-11-02 NOTE — Addendum Note (Signed)
Addended by: Yehuda Savannah on: 11/02/2022 09:32 AM   Modules accepted: Orders

## 2022-11-02 NOTE — Addendum Note (Signed)
Addended by: Alysia Penna L on: 11/02/2022 04:15 PM   Modules accepted: Orders

## 2022-11-16 DIAGNOSIS — G4733 Obstructive sleep apnea (adult) (pediatric): Secondary | ICD-10-CM | POA: Diagnosis not present

## 2022-11-24 NOTE — Patient Instructions (Incomplete)

## 2022-11-24 NOTE — Progress Notes (Unsigned)
PATIENT: Sherry [name] DOB: 1965/03/18  REASON FOR VISIT: follow up HISTORY FROM: patient  No chief complaint on file.    HISTORY OF PRESENT ILLNESS:  11/25/2022 ALL: Sherry [name] returns for follow up for OSA on CPAP. She continues do to well on therapy. She is using CPAP nightly for about 7-8 hours, on average. She denies concerns with machine or supplies.     01/12/22 ALL: Sherry [name] returns for follow up for OSA on CPAP. She continues to do very well with therapy. She loves using CPAP. She admits that she falls asleep for a couple hours then wakes up and starts therapy. She notes improvement in sleep quality when using CPAP. She denies concerns with machine or supplies.   She has been working with cardiology for HTN. She feels chlorthalidone works best to manage readings. Typical home readings are 135-144/80's. She has not taken medication this morning. She is asymptomatic. She does have chronic fatigue and feels she has a harder time focusing at times.     01/13/2021 ALL: Sherry [name] returns for follow up for OSA on CPAP. She reports that she is doing very well. She reports CPAP has been the best thing that has ever happened to her. She sleeps very well. She doesn't have trouble with insomnia any longer. She is sleeping well. No concerns with machine or supplies.   BP has been elevated at home. Usually around 140-150/90's. She recently stopped lisinopril and Maxide and reports that readings are unchanged. She feels better off these medications. She is followed closely by PCP. She was last seen 07/2020.     02/20/2019 ALL:  Sherry [name] is a 57 y.o. female here today for follow up.  She is doing very well on CPAP therapy.  She reports that she cannot sleep without her CPAP machine.  She notes significant improvements in sleep quality as well as increased energy.  She is working on lifestyle changes.  She has recently started monitoring her intake of simple sugars.  She has lost at  least 5 to 10 pounds.  She is feeling much more energized.  She feels that mental clarity is sharper.  She denies any seizure like activity.  She is followed by endocrinology.  Compliance report dated 01/20/2019 through 02/18/2019 reveals that she use CPAP every night for compliance of 100%.  Every night she used CPAP greater than 4 hours for compliance of 100%.  Average usage was 7 hours and 56 minutes.  Residual AHI was 1.3 on 5 to 13 cm of water and EPR of 3.  There was no significant leak noted.  BP 133/85 WT 300lbs.   History (copied from Shanda Bumps Mccue's note on 05/03/2018)  Sherry [name] is a 57 y.o. female with recent diagnosis of OSA and CPAP initiation. She was initially scheduled for face-to-face office visit today at this time for initial CPAP compliance visit but due to COVID19, face-to-face office visit rescheduled for non-face-to-face telephone visit.     CPAP compliance report from 04/03/2018 -05/02/2018 shows 30 out of 30 usage days with 30 days greater than 4 hours for 100% compliance.  Average usage 9 hours and 1 minute with residual AHI 1.9.  Leaks in the 95th percentile 17.7.  Pressure in the 95th percentile 11.8 with minimum pressure 5 cm H2O and max pressure 13 cm H2O with a EPR 3.   She reports doing exceptionally well on CPAP with overall improvement of her energy level and sleep quality at night.  She denies any  additional symptoms of vertigo or dizziness as she was having these symptoms prior to initiating CPAP.  She feels as though she is more energized during the day and is planning on getting into an exercise/workout routine as she had difficulty prior due to lack of energy.  She does have concerns regarding her current DME company [employer] as she has been experiencing difficulties reaching them and will have to make multiple phone calls.     Epworth Sleepiness Scale: 5/24 (today's visit) Epworth Sleepiness Scale: 11/24 (12/21/2017)   Of note, during titration study EEG  abnormalities identified and discussion with Dr. Vickey Huger regarding potentially initiating AED but decided on using CPAP first as she does have a history of pituitary adenoma.  She states she followed up with her endocrinologist who recommended not initiating AED unless repeat sleep study or EEG obtained to assess for additional seizure activity or abnormalities.   REVIEW OF SYSTEMS: Out of a complete 14 system review of symptoms, the patient complains only of the following symptoms, fatigue, difficulty focusing and all other reviewed systems are negative.  ESS: 11/24, previously 7/24  ALLERGIES: Allergies  Allergen Reactions   Aspirin Rash, Nausea And Vomiting, Shortness Of Breath and Hives   Amlodipine Other (See Comments)    Muscle pain   Vitamin D Analogs Other (See Comments)    Prescription strength pt counldn't focus, had the shakes and her cognitive skills were off balance    Shellfish Allergy Swelling and Rash    HOME MEDICATIONS: Outpatient Medications Prior to Visit  Medication Sig Dispense Refill   bromocriptine (PARLODEL) 2.5 MG tablet Take 2.5 mg by mouth at bedtime.     chlorthalidone (HYGROTON) 25 MG tablet Take 1 tablet (25 mg total) by mouth daily. (Patient taking differently: Take 25 mg by mouth every other day.) 90 tablet 3   Cholecalciferol (VITAMIN D) 50 MCG (2000 UT) tablet Take 2,000 Units by mouth at bedtime.     EPINEPHrine (EPIPEN 2-PAK) 0.3 mg/0.3 mL IJ SOAJ injection Inject 0.3 mg into the muscle as needed for anaphylaxis. 2 each 0   Multiple Vitamins-Minerals (MULTIVITAMIN ADULT PO) Take 1 tablet by mouth at bedtime.     Omega-3 Fatty Acids (MINI OMEGA-3 BURP-LESS) 540 MG CAPS Take 540 mg by mouth at bedtime.     potassium chloride SA (KLOR-CON M) 20 MEQ tablet Take 1 tablet (20 mEq total) by mouth daily. 30 tablet 5   rosuvastatin (CRESTOR) 10 MG tablet Take 1 tablet (10 mg total) by mouth at bedtime. 90 tablet 3   triamcinolone cream (KENALOG) 0.5 % APPLY 1  APPLICATION TOPICALLY TWICE DAILY 454 g 1   No facility-administered medications prior to visit.    PAST MEDICAL HISTORY: Past Medical History:  Diagnosis Date   Anemia    hx of years ago   Brain tumor (benign) (HCC) 1996   benign pituitary adenoma   Chickenpox    Hypertension    OSA (obstructive sleep apnea)    cpap night seizures    PAST SURGICAL HISTORY: Past Surgical History:  Procedure Laterality Date   COLONOSCOPY WITH PROPOFOL N/A 11/23/2021   Procedure: COLONOSCOPY WITH PROPOFOL;  Surgeon: Tressia Danas, MD;  Location: [facility];  Service: Gastroenterology;  Laterality: N/A;   POLYPECTOMY  11/23/2021   Procedure: POLYPECTOMY;  Surgeon: Tressia Danas, MD;  Location: [facility];  Service: Gastroenterology;;   TRANSPHENOIDAL PITUITARY RESECTION  1996   benign pituitary adenoma   WISDOM TOOTH EXTRACTION      FAMILY HISTORY:  Family History  Problem Relation Age of Onset   Hypertension Mother    Heart attack Father 49       result of DVT   Pulmonary embolism Father    Hypertension Maternal Aunt    Cancer Maternal Aunt        breast cancer   CVA Maternal Aunt 48   Stroke Maternal Aunt 26   Breast cancer Maternal Aunt    Hypertension Maternal Uncle    Leukemia Maternal Grandmother    Colon cancer Neg Hx    Esophageal cancer Neg Hx    Liver cancer Neg Hx    Stomach cancer Neg Hx     SOCIAL HISTORY: Social History   Socioeconomic History   Marital status: Married    Spouse name: Not on file   Number of children: 0   Years of education: Not on file   Highest education level: Not on file  Occupational History   Not on file  Tobacco Use   Smoking status: Never   Smokeless tobacco: Never  Vaping Use   Vaping status: Never Used  Substance and Sexual Activity   Alcohol use: Never   Drug use: Never   Sexual activity: Yes  Other Topics Concern   Not on file  Social History Narrative   Not on file   Social Determinants of Health    Financial Resource Strain: Not on file  Food Insecurity: Not on file  Transportation Needs: Not on file  Physical Activity: Not on file  Stress: Not on file  Social Connections: Not on file  Intimate Partner Violence: Not on file     PHYSICAL EXAM  There were no vitals filed for this visit.   There is no height or weight on file to calculate BMI.  Generalized: Well developed, in no acute distress  Cardiology: normal rate and rhythm, no murmur noted Respiratory: clear to auscultation bilaterally  Neurological examination  Mentation: Alert oriented to time, place, history taking. Follows all commands speech and language fluent Cranial nerve II-XII: Pupils were equal round reactive to light. Extraocular movements were full, visual field were full  Motor: The motor testing reveals 5 over 5 strength of all 4 extremities. Good symmetric motor tone is noted throughout.  Gait and station: Gait is normal.    DIAGNOSTIC DATA (LABS, IMAGING, TESTING) - I reviewed patient records, labs, notes, testing and imaging myself where available.      No data to display           Lab Results  Component Value Date   WBC 12.9 (H) 07/08/2022   HGB 15.4 07/08/2022   HCT 47.0 (H) 07/08/2022   MCV 94 07/08/2022   PLT 335 07/08/2022      Component Value Date/Time   NA 137 11/02/2022 0949   NA 143 10/21/2020 1138   K 3.3 (L) 11/02/2022 0949   CL 101 11/02/2022 0949   CO2 25 11/02/2022 0949   GLUCOSE 94 11/02/2022 0949   BUN 10 11/02/2022 0949   BUN 9 10/21/2020 1138   CREATININE 0.92 11/02/2022 0949   CALCIUM 9.1 11/02/2022 0949   PROT 7.4 11/02/2022 0949   PROT 6.9 09/12/2019 1459   ALBUMIN 3.5 11/02/2022 0949   ALBUMIN 3.9 09/12/2019 1459   AST 19 11/02/2022 0949   ALT 18 11/02/2022 0949   ALKPHOS 76 11/02/2022 0949   BILITOT 0.8 11/02/2022 0949   BILITOT 0.3 09/12/2019 1459   GFRNONAA >60 11/02/2022 0949   GFRAA 91 10/25/2019  0865   Lab Results  Component Value Date    CHOL 224 (H) 11/02/2022   HDL 57 11/02/2022   LDLCALC 150 (H) 11/02/2022   TRIG 87 11/02/2022   CHOLHDL 3.9 11/02/2022   Lab Results  Component Value Date   HGBA1C 6.3 (A) 10/28/2022   HGBA1C 6.3 10/28/2022   HGBA1C 6.3 10/28/2022   HGBA1C 6.3 10/28/2022   Lab Results  Component Value Date   VITAMINB12 924 06/15/2018   Lab Results  Component Value Date   TSH 1.641 10/25/2022     ASSESSMENT AND PLAN 57 y.o. year old female  has a past medical history of Anemia, Brain tumor (benign) (HCC) (1996), Chickenpox, Hypertension, and OSA (obstructive sleep apnea). here with   No diagnosis found.   Karmel Bowen is doing well on CPAP therapy. Compliance report reveals optimum compliance. She was encouraged to continue using CPAP nightly and for greater than 4 hours each night. We will update supply orders as indicated. Risks of untreated sleep apnea review and education materials provided. I have reviewed her BP reading and educated her on the importance of BP management. She is asymptomatic, today. Red flag warnings reviewed and she is aware of when to seek emergency medical attention. Education material provided in AVS. She will contact PCP to discuss asap. Consider neurocognitive evaluation if needed. Healthy lifestyle habits encouraged. She will follow up in 1 year, sooner if needed. She verbalizes understanding and agreement with this plan.    No orders of the defined types were placed in this encounter.     No orders of the defined types were placed in this encounter.      Shawnie Dapper, FNP-C 11/24/2022, 4:25 PM [facility] [address], [address] [place], [place] [address] [phone]

## 2022-11-25 ENCOUNTER — Encounter: Payer: Self-pay | Admitting: Family Medicine

## 2022-11-25 ENCOUNTER — Ambulatory Visit: Payer: Federal, State, Local not specified - PPO | Admitting: Family Medicine

## 2022-11-25 VITALS — BP 143/94 | HR 81 | Ht 63.0 in | Wt 293.0 lb

## 2022-11-25 DIAGNOSIS — G4733 Obstructive sleep apnea (adult) (pediatric): Secondary | ICD-10-CM | POA: Diagnosis not present

## 2022-11-29 ENCOUNTER — Telehealth: Payer: Self-pay | Admitting: Family Medicine

## 2022-11-29 NOTE — Telephone Encounter (Signed)
Pt has called to give better clarity re: what she may have relayed regarding her usage of the CPAP.  Pt  states the CPAP has helped her tremendously, so much so that on the days she shows as only using the CPAP 3- 5 hours its because she may have fallen asleep before putting the machine on. Please call pt to discuss if better clarity is needed.

## 2022-12-01 ENCOUNTER — Ambulatory Visit
Admission: RE | Admit: 2022-12-01 | Discharge: 2022-12-01 | Disposition: A | Discharge status: Home / Self Care | Payer: Federal, State, Local not specified - PPO | Source: Ambulatory Visit | Attending: Nurse Practitioner

## 2022-12-01 DIAGNOSIS — Z1231 Encounter for screening mammogram for malignant neoplasm of breast: Secondary | ICD-10-CM

## 2022-12-15 DIAGNOSIS — G4733 Obstructive sleep apnea (adult) (pediatric): Secondary | ICD-10-CM | POA: Diagnosis not present

## 2022-12-19 DIAGNOSIS — G4733 Obstructive sleep apnea (adult) (pediatric): Secondary | ICD-10-CM | POA: Diagnosis not present

## 2023-01-14 DIAGNOSIS — G4733 Obstructive sleep apnea (adult) (pediatric): Secondary | ICD-10-CM | POA: Diagnosis not present

## 2023-01-20 ENCOUNTER — Ambulatory Visit: Payer: Federal, State, Local not specified - PPO | Admitting: Family Medicine

## 2023-02-14 DIAGNOSIS — G4733 Obstructive sleep apnea (adult) (pediatric): Secondary | ICD-10-CM | POA: Diagnosis not present

## 2023-03-09 ENCOUNTER — Other Ambulatory Visit: Payer: Self-pay

## 2023-03-09 MED ORDER — CHLORTHALIDONE 25 MG PO TABS: 25.0000 mg | ORAL_TABLET | ORAL | 0 refills | Status: DC

## 2023-03-09 NOTE — Telephone Encounter (Signed)
 Requested Prescriptions   Signed Prescriptions Disp Refills   chlorthalidone  (HYGROTON ) 25 MG tablet 45 tablet 0    Sig: Take 1 tablet (25 mg total) by mouth every other day.    Authorizing Provider: DARLISS ROGUE    Ordering User: CLAUDENE POWELL CROME   Last office visit: 07/09/22 with plan to f/u 12 months. next office visit: none/active recall

## 2023-04-22 DIAGNOSIS — G4733 Obstructive sleep apnea (adult) (pediatric): Secondary | ICD-10-CM | POA: Diagnosis not present

## 2023-05-23 DIAGNOSIS — G4733 Obstructive sleep apnea (adult) (pediatric): Secondary | ICD-10-CM | POA: Diagnosis not present

## 2023-05-24 DIAGNOSIS — D352 Benign neoplasm of pituitary gland: Secondary | ICD-10-CM | POA: Diagnosis not present

## 2023-06-22 DIAGNOSIS — G4733 Obstructive sleep apnea (adult) (pediatric): Secondary | ICD-10-CM | POA: Diagnosis not present

## 2023-07-07 ENCOUNTER — Ambulatory Visit: Admitting: Family Medicine

## 2023-07-07 ENCOUNTER — Encounter: Payer: Self-pay | Admitting: Family Medicine

## 2023-07-07 VITALS — BP 147/89 | HR 87 | Wt 291.4 lb

## 2023-07-07 DIAGNOSIS — I152 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: Secondary | ICD-10-CM | POA: Diagnosis not present

## 2023-07-07 DIAGNOSIS — N939 Abnormal uterine and vaginal bleeding, unspecified: Secondary | ICD-10-CM | POA: Diagnosis not present

## 2023-07-07 NOTE — Assessment & Plan Note (Signed)
 Seems to be winding down and will hopefully become fully menopausal soon.

## 2023-07-07 NOTE — Progress Notes (Signed)
 Follow up: Was given Provera  on 07/08/22- Pt did not take    "Issue took care of itself"- AUB has stopped

## 2023-07-07 NOTE — Assessment & Plan Note (Signed)
Declines medication

## 2023-07-07 NOTE — Progress Notes (Signed)
   Subjective:    Patient ID: Sherry [name] is a 58 y.o. female presenting with Follow-up  on 07/07/2023  HPI: Still having some spotting and is very light. She has had 2 EMBs which are negative. Her last FSH was in the 20s.  She continues to take bromocriptine for prolactinoma.  Review of Systems  Constitutional:  Negative for chills and fever.  Respiratory:  Negative for shortness of breath.   Cardiovascular:  Negative for chest pain.  Gastrointestinal:  Negative for abdominal pain, nausea and vomiting.  Genitourinary:  Negative for dysuria.  Skin:  Negative for rash.      Objective:    BP (!) 147/89   Pulse 87   Wt 291 lb 6.4 oz (132.2 kg)   BMI 51.62 kg/m  Physical Exam Exam conducted with a chaperone present.  Constitutional:      General: She is not in acute distress.    Appearance: She is well-developed.  HENT:     Head: Normocephalic and atraumatic.  Eyes:     General: No scleral icterus. Cardiovascular:     Rate and Rhythm: Normal rate.  Pulmonary:     Effort: Pulmonary effort is normal.  Abdominal:     Palpations: Abdomen is soft.  Musculoskeletal:     Cervical back: Neck supple.  Skin:    General: Skin is warm and dry.  Neurological:     Mental Status: She is alert and oriented to person, place, and time.         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      Abnormal uterine bleeding - Primary   Seems to be winding down and will hopefully become fully menopausal soon.        Return in about 4 months (around 11/06/2023) for a CPE.  Granville Layer, MD 07/07/2023 9:03 AM

## 2023-07-27 ENCOUNTER — Ambulatory Visit: Admitting: Student

## 2023-07-28 ENCOUNTER — Ambulatory Visit: Admitting: Student

## 2023-08-22 ENCOUNTER — Other Ambulatory Visit
Admission: RE | Admit: 2023-08-22 | Discharge: 2023-08-22 | Disposition: A | Discharge status: Home / Self Care | Source: Ambulatory Visit | Attending: Endocrinology | Admitting: Endocrinology

## 2023-08-22 DIAGNOSIS — D352 Benign neoplasm of pituitary gland: Secondary | ICD-10-CM | POA: Diagnosis not present

## 2023-08-23 LAB — PROLACTIN: Prolactin: 9.8 ng/mL (ref 3.6–25.2)

## 2023-08-24 DIAGNOSIS — D353 Benign neoplasm of craniopharyngeal duct: Secondary | ICD-10-CM | POA: Diagnosis not present

## 2023-08-24 DIAGNOSIS — D352 Benign neoplasm of pituitary gland: Secondary | ICD-10-CM | POA: Diagnosis not present

## 2023-08-24 DIAGNOSIS — I10 Essential (primary) hypertension: Secondary | ICD-10-CM | POA: Diagnosis not present

## 2023-09-14 ENCOUNTER — Ambulatory Visit: Attending: Nurse Practitioner | Admitting: Nurse Practitioner

## 2023-09-14 ENCOUNTER — Encounter: Payer: Self-pay | Admitting: Nurse Practitioner

## 2023-09-14 VITALS — BP 160/98 | HR 82 | Ht 63.0 in | Wt 292.4 lb

## 2023-09-14 DIAGNOSIS — I10 Essential (primary) hypertension: Secondary | ICD-10-CM | POA: Diagnosis not present

## 2023-09-14 NOTE — Patient Instructions (Signed)
 Medication Instructions:  The current medical regimen is effective;  continue present plan and medications as directed. Please refer to the Current Medication list given to you today.   *If you need a refill on your cardiac medications before your next appointment, please call your pharmacy*  Follow-Up: At [facility], you and your health needs are our priority.  As part of our continuing mission to provide you with exceptional heart care, our providers are all part of one team.  This team includes your primary Cardiologist (physician) and Advanced Practice Providers or APPs (Physician Assistants and Nurse Practitioners) who all work together to provide you with the care you need, when you need it.  Your next appointment:   1 month(s)  Provider:   Lonni Meager, NP    We recommend signing up for the patient portal called MyChart.  Sign up information is provided on this After Visit Summary.  MyChart is used to connect with patients for Virtual Visits (Telemedicine).  Patients are able to view lab/test results, encounter notes, upcoming appointments, etc.  Non-urgent messages can be sent to your provider as well.   To learn more about what you can do with MyChart, go to [email or web address].   Other Instructions Take BP/HR at home and bring log to next appointment.

## 2023-09-14 NOTE — Progress Notes (Signed)
 Office Visit    Patient Name: Sherry [name] Date of Encounter: 09/14/2023  Primary Care Provider:  Katheen Roselie Rockford, NP Primary Cardiologist:  Sherry Cave, MD    Chief Complaint    58 y.o. female with a history of hypertension, obesity, obstructive sleep apnea on CPAP, and pituitary adenoma status post excision in 1996, who presents for hypertension follow-up.  Past Medical History   Subjective   Past Medical History:  Diagnosis Date   Anemia    hx of years ago   Brain tumor (benign) (HCC) 1996   benign pituitary adenoma   Chickenpox    Diastolic dysfunction    a. 10/2019 Echo: EF 55-60%, no rwma, GrI DD, nl RV fxn, RVSP 34.109mmHg. Mildly dil RA. Triv MR.   Hypertension    OSA (obstructive sleep apnea)    cpap night seizures   Past Surgical History:  Procedure Laterality Date   COLONOSCOPY WITH PROPOFOL  N/A 11/23/2021   Procedure: COLONOSCOPY WITH PROPOFOL ;  Surgeon: Sherry Iha, MD;  Location: [facility];  Service: Gastroenterology;  Laterality: N/A;   POLYPECTOMY  11/23/2021   Procedure: POLYPECTOMY;  Surgeon: Sherry Iha, MD;  Location: [facility];  Service: Gastroenterology;;   TRANSPHENOIDAL PITUITARY RESECTION  1996   benign pituitary adenoma   WISDOM TOOTH EXTRACTION      Allergies  Allergies  Allergen Reactions   Aspirin Rash, Nausea And Vomiting, Shortness Of Breath and Hives   Amlodipine  Other (See Comments)    Muscle pain   Chlorthalidone      Lack of focus   Irbesartan      Ankle swelling; prolonged menstruation   Lisinopril      acne   Maxzide [Hydrochlorothiazide-Triamterene ]     acne   Vitamin D Analogs Other (See Comments)    Prescription strength pt counldn't focus, had the shakes and her cognitive skills were off balance    Shellfish Allergy Swelling and Rash       History of Present Illness      58 y.o. y/o female with a history of hypertension, obesity, obstructive sleep apnea on CPAP, and pituitary adenoma  status post excision in 1996.  Following excision of pituitary adenoma, she developed neurological issues with lack of focus and memory impairment.  She was diagnosed with hypertension in 2018 and was initially placed on amlodipine  but developed myalgias and this was stopped.  She was later started on lisinopril  and Maxide but developed worsening facial acne  that resolved following discontinuation of lisinopril  and Maxide.  She established care with Dr. Cave in March 2023 due to untreated hypertension and prior drug intolerances.  She was placed on irbesartan  150 mg daily which she felt resulted in changes to her menstruation and also ankle swelling.  This was discontinued and she was placed on chlorthalidone .   Sherry [name] was tolerating chlorthalidone  at her June 2024 follow-up visit.  Blood pressure was moderately elevated at 144/98 but trending in the 130s to 140s at home and no medication changes were made.  Unfortunately, she has since discontinued chlorthalidone  stating that it was making her feel unfocused.  Further, she did not think that it was making a significant difference in her blood pressure, which she tracks regularly at home.  She frequently trends in the 160 range.  She is careful with processed food intake and does not add salt to food.  For the most part, she leans vegetarian.  She does not routinely exercise, something which she plans to change.  She continues to  teach clarinet and saxophone, which she enjoys.  She denies chest pain, dyspnea, palpitations, PND, orthopnea, dizziness, syncope, or early satiety.  She does experience ankle swelling, which she thinks has improved despite stopping chlorthalidone . Objective   Home Medications    Current Outpatient Medications  Medication Sig Dispense Refill   bromocriptine (PARLODEL) 2.5 MG tablet Take 2.5 mg by mouth at bedtime.     Cholecalciferol (VITAMIN D) 50 MCG (2000 UT) tablet Take 2,000 Units by mouth at bedtime.      EPINEPHrine  (EPIPEN  2-PAK) 0.3 mg/0.3 mL IJ SOAJ injection Inject 0.3 mg into the muscle as needed for anaphylaxis. 2 each 0   Multiple Vitamins-Minerals (MULTIVITAMIN ADULT PO) Take 1 tablet by mouth at bedtime.     Omega-3 Fatty Acids (MINI OMEGA-3 BURP-LESS) 540 MG CAPS Take 540 mg by mouth at bedtime.     triamcinolone  cream (KENALOG ) 0.5 % APPLY 1 APPLICATION TOPICALLY TWICE DAILY 454 g 1   No current facility-administered medications for this visit.     Physical Exam    VS:  BP (!) 160/98   Pulse 82   Ht 5' 3 (1.6 m)   Wt 292 lb 6.4 oz (132.6 kg)   SpO2 97%   BMI 51.80 kg/m  , BMI Body mass index is 51.8 kg/m.    Vitals:   09/14/23 0944 09/14/23 1200  BP: (!) 150/84 (!) 160/98  Pulse: 82   SpO2: 97%           GEN: Well nourished, well developed, in no acute distress. HEENT: normal. Neck: Supple, no JVD, carotid bruits, or masses. Cardiac: RRR, 1/6 systolic murmur at the upper sternal borders, no rubs or gallops. No clubbing, cyanosis, 1+ bilateral ankle edema.  Radials 2+/PT 2+ and equal bilaterally.  Respiratory:  Respirations regular and unlabored, clear to auscultation bilaterally. GI: Soft, nontender, nondistended, BS + x 4. MS: no deformity or atrophy. Skin: warm and dry, no rash. Neuro:  Strength and sensation are intact. Psych: Normal affect.  Accessory Clinical Findings    ECG personally reviewed by me today - EKG Interpretation Date/Time:  Wednesday September 14 2023 09:56:39 EDT Ventricular Rate:  82 PR Interval:  130 QRS Duration:  80 QT Interval:  386 QTC Calculation: 450 R Axis:   -3  Text Interpretation: Normal sinus rhythm Minimal voltage criteria for LVH, may be normal variant ( R in aVL ) Cannot rule out Anterior infarct Confirmed by Sherry [name] [phone]) on 09/14/2023 10:07:28 AM  - no acute changes.  Lab Results  Component Value Date   WBC 12.9 (H) 07/08/2022   HGB 15.4 07/08/2022   HCT 47.0 (H) 07/08/2022   MCV 94 07/08/2022   PLT  335 07/08/2022   Lab Results  Component Value Date   CREATININE 0.92 11/02/2022   BUN 10 11/02/2022   NA 137 11/02/2022   K 3.3 (L) 11/02/2022   CL 101 11/02/2022   CO2 25 11/02/2022   Lab Results  Component Value Date   ALT 18 11/02/2022   AST 19 11/02/2022   ALKPHOS 76 11/02/2022   BILITOT 0.8 11/02/2022   Lab Results  Component Value Date   CHOL 224 (H) 11/02/2022   HDL 57 11/02/2022   LDLCALC 150 (H) 11/02/2022   TRIG 87 11/02/2022   CHOLHDL 3.9 11/02/2022    Lab Results  Component Value Date   HGBA1C 6.3 (A) 10/28/2022   HGBA1C 6.3 10/28/2022   HGBA1C 6.3 10/28/2022   HGBA1C 6.3 10/28/2022  Lab Results  Component Value Date   TSH 1.641 10/25/2022       Assessment & Plan    1.  Primary hypertension: Historically difficult to manage in the setting of multiple drug intolerances (amlodipine , irbesartan , lisinopril , Maxide) though she was most recently reasonably managed on chlorthalidone  therapy.  Unfortunately, since her last visit, she discontinued chlorthalidone  due to feeling that it contributed to ongoing loss of focus.  Following discontinuation, she feels that her mental sharpness has improved and she does not wish to resume.  Unfortunately, blood pressure is elevated on 2 consecutive recordings today and upon review of her home blood pressure trends, she typically runs in the 150-160 range as well.  We discussed options for management including potentially initiating an alternate medication such as carvedilol or clonidine patch.  She is a Engineer, mining and does not think that she would tolerate clonidine due to side effect of dry mouth.  At this time, she is not interested in trying carvedilol and says that as of today, she plans to initiate an exercise regimen and focus on weight loss.  We agreed to a trial of lifestyle modifications but that if she continues to trend greater than 140 at home, that she should notify us  and have a low threshold to  accept a prescription for carvedilol.  We agreed to follow-up in the office in 1 month to reevaluate and discuss.  2.  Morbid obesity: Playing a role in her hypertension.  We had a discussion regarding lifestyle modifications today including at least 30 minutes of exercise 5 days a week with 2 to 3 days a week of resistance training, avoidance of processed foods, and focus on a more vegetarian based diet.  She plans to begin exercising 30 minutes daily.  She notes that she already is more or less a pescatarian and tries to avoid processed food.  She would benefit from GLP-1 a therapy, but based on prior intolerances, would not likely tolerate typical side effects.  3.  Disposition: Follow-up in clinic in 1 month to reevaluate blood pressure.  Lonni Meager, NP 09/14/2023, 12:12 PM

## 2023-10-11 ENCOUNTER — Telehealth: Payer: Self-pay

## 2023-10-11 NOTE — Telephone Encounter (Signed)
 Copied from CRM #[id number]. Topic: Clinical - Request for Lab/Test Order >> Oct 11, 2023  9:23 AM Franky GRADE wrote: Reason for CRM: Patient completed a 7 day fast and would like to have labs drawn for a full panel. >> Oct 11, 2023  9:27 AM Franky GRADE wrote: If labs are approved, if we could fax it to her. Fax number [phone]

## 2023-10-11 NOTE — Telephone Encounter (Signed)
 Called patient and informed her of Charlotte's comments. She thanked me for calling and stated that she realized that she mixed up Roselie and her Endocrinologist. I thanked her for taking my call. All questions (if any) were answered.

## 2023-10-21 ENCOUNTER — Ambulatory Visit: Attending: Nurse Practitioner | Admitting: Nurse Practitioner

## 2023-10-21 ENCOUNTER — Encounter: Payer: Self-pay | Admitting: Nurse Practitioner

## 2023-10-21 VITALS — BP 152/90 | HR 86 | Ht 63.0 in | Wt 281.4 lb

## 2023-10-21 DIAGNOSIS — I10 Essential (primary) hypertension: Secondary | ICD-10-CM | POA: Diagnosis not present

## 2023-10-21 NOTE — Patient Instructions (Signed)
 Medication Instructions:  No changes *If you need a refill on your cardiac medications before your next appointment, please call your pharmacy*  Lab Work: None ordered If you have labs (blood work) drawn today and your tests are completely normal, you will receive your results only by: MyChart Message (if you have MyChart) OR A paper copy in the mail If you have any lab test that is abnormal or we need to change your treatment, we will call you to review the results.  Testing/Procedures: None ordered  Follow-Up: At [facility], you and your health needs are our priority.  As part of our continuing mission to provide you with exceptional heart care, our providers are all part of one team.  This team includes your primary Cardiologist (physician) and Advanced Practice Providers or APPs (Physician Assistants and Nurse Practitioners) who all work together to provide you with the care you need, when you need it.  Your next appointment:   2 month(s)  Provider:   You may see Redell Cave, MD or one of the following Advanced Practice Providers on your designated Care Team:   Lonni Meager, NP

## 2023-10-21 NOTE — Progress Notes (Signed)
 Office Visit    Patient Name: Sherry [name] Date of Encounter: 10/21/2023  Primary Care Provider:  Katheen Roselie Rockford, NP Primary Cardiologist:  Redell Cave, MD    Chief Complaint    58 y.o. female with a history of hypertension, obesity, obstructive sleep apnea on CPAP, and pituitary adenoma status post excision in 1996, who presents for hypertension follow-up.   Past Medical History   Subjective   Past Medical History:  Diagnosis Date   Anemia    hx of years ago   Brain tumor (benign) (HCC) 1996   benign pituitary adenoma   Chickenpox    Diastolic dysfunction    a. 10/2019 Echo: EF 55-60%, no rwma, GrI DD, nl RV fxn, RVSP 34.61mmHg. Mildly dil RA. Triv MR.   Hypertension    OSA (obstructive sleep apnea)    cpap night seizures   Past Surgical History:  Procedure Laterality Date   COLONOSCOPY WITH PROPOFOL  N/A 11/23/2021   Procedure: COLONOSCOPY WITH PROPOFOL ;  Surgeon: Eda Iha, MD;  Location: [facility];  Service: Gastroenterology;  Laterality: N/A;   POLYPECTOMY  11/23/2021   Procedure: POLYPECTOMY;  Surgeon: Eda Iha, MD;  Location: [facility];  Service: Gastroenterology;;   TRANSPHENOIDAL PITUITARY RESECTION  1996   benign pituitary adenoma   WISDOM TOOTH EXTRACTION      Allergies  Allergies  Allergen Reactions   Aspirin Rash, Nausea And Vomiting, Shortness Of Breath and Hives   Amlodipine  Other (See Comments)    Muscle pain   Chlorthalidone      Lack of focus   Irbesartan      Ankle swelling; prolonged menstruation   Lisinopril      acne   Maxzide [Hydrochlorothiazide-Triamterene ]     acne   Vitamin D Analogs Other (See Comments)    Prescription strength pt counldn't focus, had the shakes and her cognitive skills were off balance    Shellfish Allergy Swelling and Rash       History of Present Illness      58 y.o. y/o female with a history of hypertension, obesity, obstructive sleep apnea on CPAP, and pituitary  adenoma status post excision in 1996.  Following excision of pituitary adenoma, she developed neurological issues with lack of focus and memory impairment.  She was diagnosed with hypertension in 2018 and was initially placed on amlodipine  but developed myalgias and this was stopped.  She was later started on lisinopril  and Maxide but developed worsening facial acne  that resolved following discontinuation of lisinopril  and Maxide.  She established care with Dr. Cave in March 2023 due to untreated hypertension and prior drug intolerances.  She was placed on irbesartan  150 mg daily which she felt resulted in changes to her menstruation and also ankle swelling.  This was discontinued and she was placed on chlorthalidone .    Ms. Sherry [name] was last seen in cardiology clinic in August 2025, which time she reported discontinuation of chlorthalidone  due to feeling unfocused at home.  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  Since her last visit, she has been trying to exercise more.  She is down 11 pounds.  She continues to track her blood pressure at home and it has been running in the high 140s to 150s, which she notes is an improvement.  She has been exercising 2 days a week, usually walking.  She has tolerated this  and plans to increase her exercise time over the coming weeks.  She denies chest pain, dyspnea, palpitations, PND, orthopnea, dizziness, syncope, or early satiety.  She does occasionally note lower extremity swelling at the end of the day.  She reiterates today that she is not interested in antihypertensive medications and prefers to pursue ongoing lifestyle modifications. Objective   Home Medications    Current Outpatient Medications  Medication Sig Dispense Refill   bromocriptine (PARLODEL) 2.5 MG tablet Take 2.5 mg by mouth at bedtime.      Cholecalciferol (VITAMIN D) 50 MCG (2000 UT) tablet Take 2,000 Units by mouth at bedtime.     EPINEPHrine  (EPIPEN  2-PAK) 0.3 mg/0.3 mL IJ SOAJ injection Inject 0.3 mg into the muscle as needed for anaphylaxis. 2 each 0   Multiple Vitamins-Minerals (MULTIVITAMIN ADULT PO) Take 1 tablet by mouth at bedtime.     Omega-3 Fatty Acids (MINI OMEGA-3 BURP-LESS) 540 MG CAPS Take 540 mg by mouth at bedtime.     triamcinolone  cream (KENALOG ) 0.5 % APPLY 1 APPLICATION TOPICALLY TWICE DAILY 454 g 1   No current facility-administered medications for this visit.     Physical Exam    VS:  BP (!) 152/90 (BP Location: Left Arm, Cuff Size: Large)   Pulse 86   Ht 5' 3 (1.6 m)   Wt 281 lb 6 oz (127.6 kg)   SpO2 98%   BMI 49.84 kg/m  , BMI Body mass index is 49.84 kg/m.          GEN: Obese, in no acute distress. HEENT: normal. Neck: Supple, no JVD, carotid bruits, or masses. Cardiac: RRR, 2/6 SEM @ upper sternal borders, no rubs or gallops. No clubbing, cyanosis, trace bilat LE edema.  Radials 2+/PT 2+ and equal bilaterally.  Respiratory:  Respirations regular and unlabored, clear to auscultation bilaterally. GI: Soft, nontender, nondistended, BS + x 4. MS: no deformity or atrophy. Skin: warm and dry, no rash. Neuro:  Strength and sensation are intact. Psych: Normal affect.  Accessory Clinical Findings    Lab Results  Component Value Date   WBC 12.9 (H) 07/08/2022   HGB 15.4 07/08/2022   HCT 47.0 (H) 07/08/2022   MCV 94 07/08/2022   PLT 335 07/08/2022   Lab Results  Component Value Date   CREATININE 0.92 11/02/2022   BUN 10 11/02/2022   NA 137 11/02/2022   K 3.3 (L) 11/02/2022   CL 101 11/02/2022   CO2 25 11/02/2022   Lab Results  Component Value Date   ALT 18 11/02/2022   AST 19 11/02/2022   ALKPHOS 76 11/02/2022   BILITOT 0.8 11/02/2022   Lab Results  Component Value Date   CHOL 224 (H) 11/02/2022   HDL 57 11/02/2022   LDLCALC 150 (H) 11/02/2022   TRIG 87 11/02/2022    CHOLHDL 3.9 11/02/2022    Lab Results  Component Value Date   HGBA1C 6.3 (A) 10/28/2022   HGBA1C 6.3 10/28/2022   HGBA1C 6.3 10/28/2022   HGBA1C 6.3 10/28/2022   Lab Results  Component Value Date   TSH 1.641 10/25/2022       Assessment & Plan    1.  Primary hypertension: Historically difficult to manage in the setting of multiple drug intolerances including amlodipine , irbesartan , lisinopril , Maxide, and most recently chlorthalidone .  At prior visit, she declined initiating medications in favor of a trial of lifestyle modifications.  She is down 11 pounds since her last visit.  Home blood pressures continue to trend  in the high 140s to 150s.  She is 150/94 today.  She would again like to forego any antihypertensives at this time and we agreed to 2 more months of lifestyle modifications but at that point, she remains hypertensive, we would look to add carvedilol therapy.  I strongly encouraged her to increase her exercise to 30 minutes at least 5 times per week and we briefly discussed caloric intake.  She says that she is more or less vegetarian.  2.  Morbid obesity: As above, she would like to continue to pursue lifestyle modifications.  I again encouraged her to increase her activity level to 30 minutes 5 days a week with 2 to 3 days of resistance exercises.  She would likely benefit from a GLP-1 antagonist though based on prior intolerances, would not likely tolerate side effects.  3.  Disposition: Follow-up in clinic in 3 months or sooner if necessary.  Lonni Meager, NP 10/21/2023, 8:17 AM

## 2023-11-08 ENCOUNTER — Other Ambulatory Visit: Payer: Self-pay | Admitting: Nurse Practitioner

## 2023-11-08 DIAGNOSIS — L2082 Flexural eczema: Secondary | ICD-10-CM

## 2023-11-10 ENCOUNTER — Telehealth: Payer: Self-pay

## 2023-11-10 NOTE — Telephone Encounter (Signed)
 Copied from CRM [id number]. Topic: Clinical - Request for Lab/Test Order >> Nov 10, 2023  2:27 PM Sherry [name] wrote: Reason for CRM: Patient would like to request orders to have a full work up of blood work and scheduled and performed before her scheduled physical  on Oct 15th  Sent to wrong practice

## 2023-11-11 ENCOUNTER — Telehealth: Payer: Self-pay | Admitting: Nurse Practitioner

## 2023-11-11 NOTE — Telephone Encounter (Signed)
 Copied from CRM #[id number]. Topic: Clinical - Request for Lab/Test Order >> Oct 11, 2023  9:23 AM Sherry [name] wrote: Reason for CRM: Patient completed a 7 day fast and would like to have labs drawn for a full panel. >> Nov 10, 2023  2:32 PM Sherry [name] wrote: Patient would like information for blood work sent to husbands fax number [phone] >> Oct 11, 2023  9:27 AM Sherry [name] wrote: If labs are approved, if we could fax it to her. Fax number [phone]

## 2023-11-11 NOTE — Telephone Encounter (Signed)
 Called patient and informed her that Roselie will order the needed labs on the day of appointment. Patient verbalized understanding and all (if any) questions were answered.

## 2023-11-16 ENCOUNTER — Ambulatory Visit (INDEPENDENT_AMBULATORY_CARE_PROVIDER_SITE_OTHER): Admitting: Nurse Practitioner

## 2023-11-16 ENCOUNTER — Encounter: Payer: Self-pay | Admitting: Nurse Practitioner

## 2023-11-16 VITALS — BP 148/92 | HR 94 | Temp 97.5°F | Ht 63.0 in | Wt 280.2 lb

## 2023-11-16 DIAGNOSIS — E785 Hyperlipidemia, unspecified: Secondary | ICD-10-CM

## 2023-11-16 DIAGNOSIS — Z0001 Encounter for general adult medical examination with abnormal findings: Secondary | ICD-10-CM | POA: Diagnosis not present

## 2023-11-16 DIAGNOSIS — E229 Hyperfunction of pituitary gland, unspecified: Secondary | ICD-10-CM | POA: Diagnosis not present

## 2023-11-16 DIAGNOSIS — E1169 Type 2 diabetes mellitus with other specified complication: Secondary | ICD-10-CM

## 2023-11-16 DIAGNOSIS — D500 Iron deficiency anemia secondary to blood loss (chronic): Secondary | ICD-10-CM

## 2023-11-16 DIAGNOSIS — Z1231 Encounter for screening mammogram for malignant neoplasm of breast: Secondary | ICD-10-CM

## 2023-11-16 DIAGNOSIS — E559 Vitamin D deficiency, unspecified: Secondary | ICD-10-CM

## 2023-11-16 DIAGNOSIS — L509 Urticaria, unspecified: Secondary | ICD-10-CM

## 2023-11-16 DIAGNOSIS — L2082 Flexural eczema: Secondary | ICD-10-CM

## 2023-11-16 DIAGNOSIS — L819 Disorder of pigmentation, unspecified: Secondary | ICD-10-CM | POA: Diagnosis not present

## 2023-11-16 LAB — CBC WITH DIFFERENTIAL/PLATELET
Basophils Absolute: 0.1 K/uL (ref 0.0–0.1)
Basophils Relative: 0.5 % (ref 0.0–3.0)
Eosinophils Absolute: 0.1 K/uL (ref 0.0–0.7)
Eosinophils Relative: 0.6 % (ref 0.0–5.0)
HCT: 44.8 % (ref 36.0–46.0)
Hemoglobin: 14.7 g/dL (ref 12.0–15.0)
Lymphocytes Relative: 14.1 % (ref 12.0–46.0)
Lymphs Abs: 1.4 K/uL (ref 0.7–4.0)
MCHC: 32.7 g/dL (ref 30.0–36.0)
MCV: 93.5 fl (ref 78.0–100.0)
Monocytes Absolute: 0.6 K/uL (ref 0.1–1.0)
Monocytes Relative: 5.8 % (ref 3.0–12.0)
Neutro Abs: 7.6 K/uL (ref 1.4–7.7)
Neutrophils Relative %: 79 % — ABNORMAL HIGH (ref 43.0–77.0)
Platelets: 292 K/uL (ref 150.0–400.0)
RBC: 4.79 Mil/uL (ref 3.87–5.11)
RDW: 14.9 % (ref 11.5–15.5)
WBC: 9.6 K/uL (ref 4.0–10.5)

## 2023-11-16 LAB — LIPID PANEL
Cholesterol: 222 mg/dL — ABNORMAL HIGH (ref 0–200)
HDL: 57.9 mg/dL (ref >39.00)
LDL Cholesterol: 150 mg/dL — ABNORMAL HIGH (ref 0–99)
NonHDL: 164.55
Total CHOL/HDL Ratio: 4
Triglycerides: 75 mg/dL (ref 0.0–149.0)
VLDL: 15 mg/dL (ref 0.0–40.0)

## 2023-11-16 LAB — IBC + FERRITIN
Ferritin: 70.4 ng/mL (ref 10.0–291.0)
Iron: 53 ug/dL (ref 42–145)
Saturation Ratios: 14.9 % — ABNORMAL LOW (ref 20.0–50.0)
TIBC: 355.6 ug/dL (ref 250.0–450.0)
Transferrin: 254 mg/dL (ref 212.0–360.0)

## 2023-11-16 LAB — HEMOGLOBIN A1C: Hgb A1c MFr Bld: 6.2 % (ref 4.6–6.5)

## 2023-11-16 LAB — MICROALBUMIN / CREATININE URINE RATIO
Creatinine,U: 75.7 mg/dL
Microalb Creat Ratio: 15.6 mg/g (ref 0.0–30.0)
Microalb, Ur: 1.2 mg/dL (ref 0.0–1.9)

## 2023-11-16 LAB — COMPREHENSIVE METABOLIC PANEL WITH GFR
ALT: 11 U/L (ref 0–35)
AST: 16 U/L (ref 0–37)
Albumin: 4.1 g/dL (ref 3.5–5.2)
Alkaline Phosphatase: 96 U/L (ref 39–117)
BUN: 8 mg/dL (ref 6–23)
CO2: 26 meq/L (ref 19–32)
Calcium: 9.3 mg/dL (ref 8.4–10.5)
Chloride: 103 meq/L (ref 96–112)
Creatinine, Ser: 0.85 mg/dL (ref 0.40–1.20)
GFR: 75.33 mL/min (ref >60.00)
Glucose, Bld: 100 mg/dL — ABNORMAL HIGH (ref 70–99)
Potassium: 3.4 meq/L — ABNORMAL LOW (ref 3.5–5.1)
Sodium: 141 meq/L (ref 135–145)
Total Bilirubin: 0.7 mg/dL (ref 0.2–1.2)
Total Protein: 7.4 g/dL (ref 6.0–8.3)

## 2023-11-16 LAB — VITAMIN D 25 HYDROXY (VIT D DEFICIENCY, FRACTURES): VITD: 44.54 ng/mL (ref 30.00–100.00)

## 2023-11-16 MED ORDER — TRIAMCINOLONE ACETONIDE 0.5 % EX CREA
TOPICAL_CREAM | CUTANEOUS | 0 refills | Status: AC
Start: 2023-11-16 — End: ?

## 2023-11-16 NOTE — Progress Notes (Signed)
 Complete physical exam  Patient: Sherry [name]   DOB: [birth year]   58 y.o. Female  MRN: [id number] Visit Date: 11/16/2023  Subjective:    Chief Complaint  Patient presents with   Annual Exam    FASTING  Refill needed for Kenalog   DUE for Shingles, Prevnar Vaccines, Foot exam, Urine ACR, Eye exam    Sherry [name] is a 58 y.o. female who presents today for a complete physical exam. She reports consuming a low fat and low sodium diet. No regular exercise regimen She generally feels well. She reports sleeping well. She does have additional problems to discuss today.  Vision:No Dental:Yes STD Screen:No  BP Readings from Last 3 Encounters:  11/16/23 (!) 148/92  10/21/23 (!) 152/90  09/14/23 (!) 160/98   Wt Readings from Last 3 Encounters:  11/16/23 280 lb 3.2 oz (127.1 kg)  10/21/23 281 lb 6 oz (127.6 kg)  09/14/23 292 lb 6.4 oz (132.6 kg)   Most recent fall risk assessment:    11/16/2023    9:02 AM  Fall Risk   Falls in the past year? 0  Injury with Fall? 0  Risk for fall due to : No Fall Risks  Follow up Falls evaluation completed   Depression screen:Yes - No Depression  Most recent depression screenings:    11/16/2023    9:02 AM 10/28/2022    9:08 AM  PHQ 2/9 Scores  PHQ - 2 Score 1 2  PHQ- 9 Score 2 3   HPI  Hypopigmentation On bilateral LE, worsening over the years. Ref to dermatology to eval for vitiligo  Hyperpituitarism Under the care of endocrinology: Dr. Milagros, last appointment 08/2023 Mrs. Kever opted to discontinue bromocriptine 09/2023. Last prolactin checked 08/2023  Repeat prolactin today Advised to f/up with endocrinology  DM (diabetes mellitus) (HCC) Repeat hgbA1c, CMP, UACr Normal foot exam today Advised to maintain low carb/low sugar diet and daily exercise Advised to schedule DIABETES eye exam  Hyperlipidemia associated with type 2 diabetes mellitus (HCC) Denied to take statin Repeat lipid panel  Vitamin D deficiency Repeat  vit D  Iron deficiency anemia due to chronic blood loss Chronic intermittent vaginal bleed Under the care of GYN Repeat CBC and iron panel  Hives Reports episodes of generalized hives when outdoors Onset 1year ago Unknown trigger Resolves spontaneously  Past Medical History:  Diagnosis Date   Anemia    hx of years ago   Brain tumor (benign) (HCC) 1996   benign pituitary adenoma   Chickenpox    Diastolic dysfunction    a. 10/2019 Echo: EF 55-60%, no rwma, GrI DD, nl RV fxn, RVSP 34.22mmHg. Mildly dil RA. Triv MR.   Hyperlipidemia    Hypertension    OSA (obstructive sleep apnea)    cpap night seizures   Past Surgical History:  Procedure Laterality Date   COLONOSCOPY WITH PROPOFOL  N/A 11/23/2021   Procedure: COLONOSCOPY WITH PROPOFOL ;  Surgeon: Eda Iha, MD;  Location: [facility];  Service: Gastroenterology;  Laterality: N/A;   POLYPECTOMY  11/23/2021   Procedure: POLYPECTOMY;  Surgeon: Eda Iha, MD;  Location: [facility];  Service: Gastroenterology;;   TRANSPHENOIDAL PITUITARY RESECTION  1996   benign pituitary adenoma   WISDOM TOOTH EXTRACTION     Social History   Socioeconomic History   Marital status: Married    Spouse name: Not on file   Number of children: 0   Years of education: Not on file   Highest education level: Not on file  Occupational History  Not on file  Tobacco Use   Smoking status: Never   Smokeless tobacco: Never  Vaping Use   Vaping status: Never Used  Substance and Sexual Activity   Alcohol use: Never   Drug use: Never   Sexual activity: Yes  Other Topics Concern   Not on file  Social History Narrative   Not on file   Social Drivers of Health   Financial Resource Strain: Not on file  Food Insecurity: Not on file  Transportation Needs: Not on file  Physical Activity: Not on file  Stress: Not on file  Social Connections: Not on file  Intimate Partner Violence: Not on file   Family Status  Relation Name  Status   Mother  Deceased   Father  Deceased   Mat Aunt  Alive   Mat Uncle  Alive   MGM  Deceased   MGF  Deceased   PGM  Deceased   PGF  Deceased   Neg Hx  (Not Specified)  No partnership data on file   Family History  Problem Relation Age of Onset   Heart disease Mother    Kidney disease Mother    Hypertension Mother    Uterine cancer Mother 37       sarcoma   Heart attack Father 68       result of DVT   Pulmonary embolism Father    Hypertension Maternal Aunt    CVA Maternal Aunt 41   Stroke Maternal Aunt 106   Hypertension Maternal Uncle    Breast cancer Maternal Grandmother    Leukemia Maternal Grandmother    Colon cancer Neg Hx    Esophageal cancer Neg Hx    Liver cancer Neg Hx    Stomach cancer Neg Hx    Allergies  Allergen Reactions   Aspirin Rash, Nausea And Vomiting, Shortness Of Breath and Hives   Amlodipine  Other (See Comments)    Muscle pain   Chlorthalidone      Lack of focus   Irbesartan      Ankle swelling; prolonged menstruation   Lisinopril      acne   Maxzide [Hydrochlorothiazide-Triamterene ]     acne   Vitamin D Analogs Other (See Comments)    Prescription strength pt counldn't focus, had the shakes and her cognitive skills were off balance    Shellfish Allergy Swelling and Rash    Patient Care Team: Berdena Cisek, Roselie Rockford, NP as PCP - General (Internal Medicine) Darliss Rogue, MD as PCP - Cardiology (Cardiology)   Medications: Outpatient Medications Prior to Visit  Medication Sig   bromocriptine (PARLODEL) 2.5 MG tablet Take 2.5 mg by mouth at bedtime.   Cholecalciferol (VITAMIN D) 50 MCG (2000 UT) tablet Take 2,000 Units by mouth at bedtime.   EPINEPHrine  (EPIPEN  2-PAK) 0.3 mg/0.3 mL IJ SOAJ injection Inject 0.3 mg into the muscle as needed for anaphylaxis.   Multiple Vitamins-Minerals (MULTIVITAMIN ADULT PO) Take 1 tablet by mouth at bedtime.   Omega-3 Fatty Acids (MINI OMEGA-3 BURP-LESS) 540 MG CAPS Take 540 mg by mouth at bedtime.    [DISCONTINUED] triamcinolone  cream (KENALOG ) 0.5 % APPLY ONE (1) APPLICATION TOPICALLY TWICE DAILY   No facility-administered medications prior to visit.   Review of Systems  Constitutional:  Negative for activity change, appetite change and unexpected weight change.  Respiratory: Negative.    Cardiovascular: Negative.   Gastrointestinal: Negative.   Endocrine: Negative for cold intolerance and heat intolerance.  Genitourinary: Negative.   Musculoskeletal: Negative.   Skin: Negative.  Neurological: Negative.   Hematological: Negative.   Psychiatric/Behavioral:  Negative for behavioral problems, decreased concentration, dysphoric mood, hallucinations, self-injury, sleep disturbance and suicidal ideas. The patient is not nervous/anxious.         Objective:  BP (!) 148/92 (BP Location: Left Arm, Patient Position: Sitting, Cuff Size: Large)   Pulse 94   Temp (!) 97.5 F (36.4 C) (Oral)   Ht 5' 3 (1.6 m)   Wt 280 lb 3.2 oz (127.1 kg)   SpO2 97%   BMI 49.64 kg/m     Physical Exam Vitals and nursing note reviewed.  Constitutional:      General: She is not in acute distress. HENT:     Right Ear: Tympanic membrane, ear canal and external ear normal.     Left Ear: Tympanic membrane, ear canal and external ear normal.     Nose: Nose normal.  Eyes:     Extraocular Movements: Extraocular movements intact.     Conjunctiva/sclera: Conjunctivae normal.     Pupils: Pupils are equal, round, and reactive to light.  Neck:     Thyroid : No thyroid  mass, thyromegaly or thyroid  tenderness.  Cardiovascular:     Rate and Rhythm: Normal rate and regular rhythm.     Pulses: Normal pulses.     Heart sounds: Normal heart sounds.  Pulmonary:     Effort: Pulmonary effort is normal.     Breath sounds: Normal breath sounds.  Abdominal:     General: Bowel sounds are normal.     Palpations: Abdomen is soft.  Musculoskeletal:        General: Normal range of motion.     Cervical back: Normal  range of motion and neck supple.     Right lower leg: Edema present.     Left lower leg: Edema present.  Lymphadenopathy:     Cervical: No cervical adenopathy.  Skin:    General: Skin is warm and dry.  Neurological:     Mental Status: She is alert and oriented to person, place, and time.     Cranial Nerves: No cranial nerve deficit.  Psychiatric:        Mood and Affect: Mood normal.        Behavior: Behavior normal.        Thought Content: Thought content normal.     No results found for any visits on 11/16/23.    Assessment & Plan:    Routine Health Maintenance and Physical Exam  Immunization History  Administered Date(s) Administered   Tdap 09/22/2018   Health Maintenance  Topic Date Due   OPHTHALMOLOGY EXAM  Never done   Diabetic kidney evaluation - Urine ACR  Never done   HEMOGLOBIN A1C  04/27/2023   Diabetic kidney evaluation - eGFR measurement  11/02/2023   COVID-19 Vaccine (1 - 2025-26 season) 12/02/2023 (Originally 10/03/2023)   Zoster Vaccines- Shingrix (1 of 2) 02/16/2024 (Originally 02/04/2015)   Influenza Vaccine  05/01/2024 (Originally 09/02/2023)   Pneumococcal Vaccine: 50+ Years (1 of 2 - PCV) 11/15/2024 (Originally 02/04/1984)   Hepatitis B Vaccines 19-59 Average Risk (1 of 3 - 19+ 3-dose series) 11/15/2024 (Originally 02/04/1984)   Hepatitis C Screening  11/15/2024 (Originally 02/04/1983)   FOOT EXAM  11/15/2024   Mammogram  11/30/2024   Cervical Cancer Screening (HPV/Pap Cotest)  10/21/2025   DTaP/Tdap/Td (2 - Td or Tdap) 09/21/2028   Colonoscopy  11/24/2031   HIV Screening  Completed   HPV VACCINES  Aged Out   Meningococcal B Vaccine  Aged Out   Discussed health benefits of physical activity, and encouraged her to engage in regular exercise appropriate for her age and condition.  Problem List Items Addressed This Visit     DM (diabetes mellitus) (HCC)   Repeat hgbA1c, CMP, UACr Normal foot exam today Advised to maintain low carb/low sugar diet and daily  exercise Advised to schedule DIABETES eye exam      Relevant Orders   Hemoglobin A1c   Microalbumin / creatinine urine ratio   Flexural eczema   Relevant Medications   triamcinolone  cream (KENALOG ) 0.5 %   Hives   Reports episodes of generalized hives when outdoors Onset 1year ago Unknown trigger Resolves spontaneously      Relevant Orders   Ambulatory referral to Allergy   Hyperlipidemia associated with type 2 diabetes mellitus (HCC)   Denied to take statin Repeat lipid panel      Relevant Orders   Lipid panel   Hyperpituitarism   Under the care of endocrinology: Dr. Milagros, last appointment 08/2023 Mrs. Hodapp opted to discontinue bromocriptine 09/2023. Last prolactin checked 08/2023  Repeat prolactin today Advised to f/up with endocrinology      Relevant Orders   Prolactin   Hypopigmentation   On bilateral LE, worsening over the years. Ref to dermatology to eval for vitiligo      Relevant Orders   Ambulatory referral to Dermatology   Iron deficiency anemia due to chronic blood loss   Chronic intermittent vaginal bleed Under the care of GYN Repeat CBC and iron panel      Relevant Orders   CBC with Differential/Platelet   IBC + Ferritin   Vitamin D deficiency   Repeat vit D      Relevant Orders   VITAMIN D 25 Hydroxy (Vit-D Deficiency, Fractures)   Other Visit Diagnoses       Encounter for preventative adult health care exam with abnormal findings    -  Primary   Relevant Orders   Comprehensive metabolic panel with GFR     Breast cancer screening by mammogram       Relevant Orders   MM 3D SCREENING MAMMOGRAM BILATERAL BREAST      Return in about 6 months (around 05/16/2024) for HTN, DM, hyperlipidemia (fasting).     Roselie Mood, NP

## 2023-11-16 NOTE — Assessment & Plan Note (Signed)
 Denied to take statin Repeat lipid panel

## 2023-11-16 NOTE — Assessment & Plan Note (Signed)
 On bilateral LE, worsening over the years. Ref to dermatology to eval for vitiligo

## 2023-11-16 NOTE — Assessment & Plan Note (Signed)
 Under the care of endocrinology: Dr. Iloka, last appointment 08/2023 Mrs. Doren opted to discontinue bromocriptine 09/2023. Last prolactin checked 08/2023  Repeat prolactin today Advised to f/up with endocrinology

## 2023-11-16 NOTE — Assessment & Plan Note (Addendum)
 Repeat hgbA1c, CMP, UACr Normal foot exam today Advised to maintain low carb/low sugar diet and daily exercise Advised to schedule DIABETES eye exam

## 2023-11-16 NOTE — Patient Instructions (Addendum)
 Schedule appointment for annual eye exam and mammogram. Contact Amy Lomax, FNP-C about CPAP machine [facility] [phone]   Go to lab Maintain Heart healthy diet and daily exercise. Maintain current medications.

## 2023-11-16 NOTE — Assessment & Plan Note (Signed)
 Chronic intermittent vaginal bleed Under the care of GYN Repeat CBC and iron panel

## 2023-11-16 NOTE — Assessment & Plan Note (Addendum)
 Reports episodes of generalized hives when outdoors Onset 1year ago Unknown trigger Resolves spontaneously

## 2023-11-16 NOTE — Assessment & Plan Note (Signed)
 Repeat vit D

## 2023-11-17 LAB — PROLACTIN: Prolactin: 12.3 ng/mL

## 2023-11-23 ENCOUNTER — Ambulatory Visit: Payer: Self-pay | Admitting: Nurse Practitioner

## 2023-12-05 NOTE — Progress Notes (Unsigned)
 SABRA

## 2023-12-05 NOTE — Progress Notes (Unsigned)
 PATIENT: Sherry [name] DOB: Mar 24, 1965  REASON FOR VISIT: follow up HISTORY FROM: patient  No chief complaint on file.    HISTORY OF PRESENT ILLNESS:  12/06/2023 ALL:  Sherry [name] returns for follow up for OSA on CPAP. She is doing well on therapy. She uses CPAP nightly for about     11/25/2022 ALL: Sherry [name] returns for follow up for OSA on CPAP. She continues do to well on therapy. She is using CPAP nightly for about 7-8 hours, on average. She denies concerns with machine or supplies. She feels that she sleeps much better when using CPAP. BP usually well managed at home. 132/70's last night. She was seen by PCP recently and advised to start rosuvastatin  due to elevated lipids. She feels labs were incorrect and does not want to start statin. A1C 6.3. She reports episodes of difficulty concentrating/focusing have nearly resolved with CPAP use.     01/12/22 ALL: Sherry [name] returns for follow up for OSA on CPAP. She continues to do very well with therapy. She loves using CPAP. She admits that she falls asleep for a couple hours then wakes up and starts therapy. She notes improvement in sleep quality when using CPAP. She denies concerns with machine or supplies.   She has been working with cardiology for HTN. She feels chlorthalidone  works best to manage readings. Typical home readings are 135-144/80's. She has not taken medication this morning. She is asymptomatic. She does have chronic fatigue and feels she has a harder time focusing at times.     01/13/2021 ALL: Sherry [name] returns for follow up for OSA on CPAP. She reports that she is doing very well. She reports CPAP has been the best thing that has ever happened to her. She sleeps very well. She doesn't have trouble with insomnia any longer. She is sleeping well. No concerns with machine or supplies.   BP has been elevated at home. Usually around 140-150/90's. She recently stopped lisinopril  and Maxide and reports that readings are  unchanged. She feels better off these medications. She is followed closely by PCP. She was last seen 07/2020.     02/20/2019 ALL:  Sherry [name] is a 58 y.o. female here today for follow up.  She is doing very well on CPAP therapy.  She reports that she cannot sleep without her CPAP machine.  She notes significant improvements in sleep quality as well as increased energy.  She is working on lifestyle changes.  She has recently started monitoring her intake of simple sugars.  She has lost at least 5 to 10 pounds.  She is feeling much more energized.  She feels that mental clarity is sharper.  She denies any seizure like activity.  She is followed by endocrinology.  Compliance report dated 01/20/2019 through 02/18/2019 reveals that she use CPAP every night for compliance of 100%.  Every night she used CPAP greater than 4 hours for compliance of 100%.  Average usage was 7 hours and 56 minutes.  Residual AHI was 1.3 on 5 to 13 cm of water and EPR of 3.  There was no significant leak noted.  BP 133/85 WT 300lbs.   History (copied from Sherry [name]'s note on 05/03/2018)  Sherry [name] is a 58 y.o. female with recent diagnosis of OSA and CPAP initiation. She was initially scheduled for face-to-face office visit today at this time for initial CPAP compliance visit but due to COVID19, face-to-face office visit rescheduled for non-face-to-face telephone visit.     CPAP compliance  report from 04/03/2018 -05/02/2018 shows 30 out of 30 usage days with 30 days greater than 4 hours for 100% compliance.  Average usage 9 hours and 1 minute with residual AHI 1.9.  Leaks in the 95th percentile 17.7.  Pressure in the 95th percentile 11.8 with minimum pressure 5 cm H2O and max pressure 13 cm H2O with a EPR 3.   She reports doing exceptionally well on CPAP with overall improvement of her energy level and sleep quality at night.  She denies any additional symptoms of vertigo or dizziness as she was having these symptoms  prior to initiating CPAP.  She feels as though she is more energized during the day and is planning on getting into an exercise/workout routine as she had difficulty prior due to lack of energy.  She does have concerns regarding her current DME company [employer] as she has been experiencing difficulties reaching them and will have to make multiple phone calls.     Epworth Sleepiness Scale: 5/24 (today's visit) Epworth Sleepiness Scale: 11/24 (12/21/2017)   Of note, during titration study EEG abnormalities identified and discussion with Dr. Chalice regarding potentially initiating AED but decided on using CPAP first as she does have a history of pituitary adenoma.  She states she followed up with her endocrinologist who recommended not initiating AED unless repeat sleep study or EEG obtained to assess for additional seizure activity or abnormalities.   REVIEW OF SYSTEMS: Out of a complete 14 system review of symptoms, the patient complains only of the following symptoms, fatigue, difficulty focusing and all other reviewed systems are negative.  ESS: 7/24, previously 11/24  ALLERGIES: Allergies  Allergen Reactions   Aspirin Rash, Nausea And Vomiting, Shortness Of Breath and Hives   Amlodipine  Other (See Comments)    Muscle pain   Chlorthalidone      Lack of focus   Irbesartan      Ankle swelling; prolonged menstruation   Lisinopril      acne   Maxzide [Hydrochlorothiazide-Triamterene ]     acne   Vitamin D Analogs Other (See Comments)    Prescription strength pt counldn't focus, had the shakes and her cognitive skills were off balance    Shellfish Allergy Swelling and Rash    HOME MEDICATIONS: Outpatient Medications Prior to Visit  Medication Sig Dispense Refill   bromocriptine (PARLODEL) 2.5 MG tablet Take 2.5 mg by mouth at bedtime.     Cholecalciferol (VITAMIN D) 50 MCG (2000 UT) tablet Take 2,000 Units by mouth at bedtime.     EPINEPHrine  (EPIPEN  2-PAK) 0.3 mg/0.3 mL IJ SOAJ  injection Inject 0.3 mg into the muscle as needed for anaphylaxis. 2 each 0   Multiple Vitamins-Minerals (MULTIVITAMIN ADULT PO) Take 1 tablet by mouth at bedtime.     Omega-3 Fatty Acids (MINI OMEGA-3 BURP-LESS) 540 MG CAPS Take 540 mg by mouth at bedtime.     triamcinolone  cream (KENALOG ) 0.5 % APPLY ONE (1) APPLICATION TOPICALLY TWICE DAILY 454 g 0   No facility-administered medications prior to visit.    PAST MEDICAL HISTORY: Past Medical History:  Diagnosis Date   Anemia    hx of years ago   Brain tumor (benign) (HCC) 1996   benign pituitary adenoma   Chickenpox    Diastolic dysfunction    a. 10/2019 Echo: EF 55-60%, no rwma, GrI DD, nl RV fxn, RVSP 34.43mmHg. Mildly dil RA. Triv MR.   Hyperlipidemia    Hypertension    OSA (obstructive sleep apnea)    cpap night seizures  PAST SURGICAL HISTORY: Past Surgical History:  Procedure Laterality Date   COLONOSCOPY WITH PROPOFOL  N/A 11/23/2021   Procedure: COLONOSCOPY WITH PROPOFOL ;  Surgeon: Eda Iha, MD;  Location: [facility];  Service: Gastroenterology;  Laterality: N/A;   POLYPECTOMY  11/23/2021   Procedure: POLYPECTOMY;  Surgeon: Eda Iha, MD;  Location: [facility];  Service: Gastroenterology;;   TRANSPHENOIDAL PITUITARY RESECTION  1996   benign pituitary adenoma   WISDOM TOOTH EXTRACTION      FAMILY HISTORY: Family History  Problem Relation Age of Onset   Heart disease Mother    Kidney disease Mother    Hypertension Mother    Uterine cancer Mother 36       sarcoma   Heart attack Father 55       result of DVT   Pulmonary embolism Father    Hypertension Maternal Aunt    CVA Maternal Aunt 87   Stroke Maternal Aunt 70   Hypertension Maternal Uncle    Breast cancer Maternal Grandmother    Leukemia Maternal Grandmother    Colon cancer Neg Hx    Esophageal cancer Neg Hx    Liver cancer Neg Hx    Stomach cancer Neg Hx     SOCIAL HISTORY: Social History   Socioeconomic History   Marital  status: Married    Spouse name: Not on file   Number of children: 0   Years of education: Not on file   Highest education level: Not on file  Occupational History   Not on file  Tobacco Use   Smoking status: Never   Smokeless tobacco: Never  Vaping Use   Vaping status: Never Used  Substance and Sexual Activity   Alcohol use: Never   Drug use: Never   Sexual activity: Yes  Other Topics Concern   Not on file  Social History Narrative   Not on file   Social Drivers of Health   Financial Resource Strain: Not on file  Food Insecurity: Not on file  Transportation Needs: Not on file  Physical Activity: Not on file  Stress: Not on file  Social Connections: Not on file  Intimate Partner Violence: Not on file     PHYSICAL EXAM  There were no vitals filed for this visit.    There is no height or weight on file to calculate BMI.  Generalized: Well developed, in no acute distress  Cardiology: normal rate and rhythm, no murmur noted Respiratory: clear to auscultation bilaterally  Neurological examination  Mentation: Alert oriented to time, place, history taking. Follows all commands speech and language fluent Cranial nerve II-XII: Pupils were equal round reactive to light. Extraocular movements were full, visual field were full  Motor: The motor testing reveals 5 over 5 strength of all 4 extremities. Good symmetric motor tone is noted throughout.  Gait and station: Gait is normal.    DIAGNOSTIC DATA (LABS, IMAGING, TESTING) - I reviewed patient records, labs, notes, testing and imaging myself where available.      No data to display           Lab Results  Component Value Date   WBC 9.6 11/16/2023   HGB 14.7 11/16/2023   HCT 44.8 11/16/2023   MCV 93.5 11/16/2023   PLT 292.0 11/16/2023      Component Value Date/Time   NA 141 11/16/2023 1000   NA 143 10/21/2020 1138   K 3.4 (L) 11/16/2023 1000   CL 103 11/16/2023 1000   CO2 26 11/16/2023 1000  GLUCOSE 100  (H) 11/16/2023 1000   BUN 8 11/16/2023 1000   BUN 9 10/21/2020 1138   CREATININE 0.85 11/16/2023 1000   CALCIUM  9.3 11/16/2023 1000   PROT 7.4 11/16/2023 1000   PROT 6.9 09/12/2019 1459   ALBUMIN 4.1 11/16/2023 1000   ALBUMIN 3.9 09/12/2019 1459   AST 16 11/16/2023 1000   ALT 11 11/16/2023 1000   ALKPHOS 96 11/16/2023 1000   BILITOT 0.7 11/16/2023 1000   BILITOT 0.3 09/12/2019 1459   GFRNONAA >60 11/02/2022 0949   GFRAA 91 10/25/2019 0903   Lab Results  Component Value Date   CHOL 222 (H) 11/16/2023   HDL 57.90 11/16/2023   LDLCALC 150 (H) 11/16/2023   TRIG 75.0 11/16/2023   CHOLHDL 4 11/16/2023   Lab Results  Component Value Date   HGBA1C 6.2 11/16/2023   Lab Results  Component Value Date   VITAMINB12 924 06/15/2018   Lab Results  Component Value Date   TSH 1.641 10/25/2022     ASSESSMENT AND PLAN 58 y.o. year old female  has a past medical history of Anemia, Brain tumor (benign) (HCC) (1996), Chickenpox, Diastolic dysfunction, Hyperlipidemia, Hypertension, and OSA (obstructive sleep apnea). here with   No diagnosis found.   Sherry [name] is doing well on CPAP therapy. Compliance report reveals optimum compliance. She was encouraged to continue using CPAP nightly and for greater than 4 hours each night. We will update supply orders as indicated. Risks of untreated sleep apnea review and education materials provided. I have reviewed BP readings, recent lipid panel and A1C with her, today. I have encouraged her to follow up closely with PCP and continue to work on healthy lifestyle habits. Education material provided in AVS. Healthy lifestyle habits encouraged. She will follow up in 1 year, sooner if needed. She verbalizes understanding and agreement with this plan.    No orders of the defined types were placed in this encounter.     No orders of the defined types were placed in this encounter.      Greig Forbes, FNP-C 12/05/2023, 8:13 AM [facility] [address] [place], [place] [address] [phone]

## 2023-12-05 NOTE — Patient Instructions (Incomplete)

## 2023-12-06 ENCOUNTER — Ambulatory Visit: Payer: Federal, State, Local not specified - PPO | Admitting: Family Medicine

## 2023-12-06 ENCOUNTER — Encounter: Payer: Self-pay | Admitting: Family Medicine

## 2023-12-06 VITALS — BP 180/115 | HR 104 | Ht 63.0 in | Wt 285.0 lb

## 2023-12-06 DIAGNOSIS — G4733 Obstructive sleep apnea (adult) (pediatric): Secondary | ICD-10-CM

## 2023-12-06 DIAGNOSIS — I152 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: Secondary | ICD-10-CM | POA: Diagnosis not present

## 2023-12-07 ENCOUNTER — Ambulatory Visit

## 2023-12-07 DIAGNOSIS — L818 Other specified disorders of pigmentation: Secondary | ICD-10-CM

## 2023-12-07 DIAGNOSIS — I872 Venous insufficiency (chronic) (peripheral): Secondary | ICD-10-CM | POA: Diagnosis not present

## 2023-12-07 DIAGNOSIS — L816 Other disorders of diminished melanin formation: Secondary | ICD-10-CM

## 2023-12-07 MED ORDER — TRIAMCINOLONE ACETONIDE 0.1 % EX OINT: TOPICAL_OINTMENT | CUTANEOUS | 5 refills | Status: AC

## 2023-12-07 NOTE — Patient Instructions (Signed)

## 2023-12-07 NOTE — Progress Notes (Signed)
    Subjective   Sherry [name] is a 58 y.o. female who presents for the following: Hypopigmentation. Patient is new patient  Today patient reports: Skin discoloration on bilateral lower extremities worsening over the past several years.  Has hx of leg swelling  No other sx - no joint/mouth pain   Review of Systems:    No other skin or systemic complaints except as noted in HPI or Assessment and Plan.  The following portions of the chart were reviewed this encounter and updated as appropriate: medications, allergies, medical history  Relevant Medical History:  n/a   Objective  Well appearing patient in no apparent distress; mood and affect are within normal limits. Examination was performed of the: Focused Exam of: bilateral lower extremities   Examination notable for: bilateral lower extremities with stasis, xerosis. 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 Condition is symptomatic/ bothersome to patient. Not currently at goal. - Discussed chronic nature of this condition and that there is no cure for this common cause of white spots on the skin.  - Stressed the importance of daily sunscreen use, SPF 50 or above daily. start triamcinolone  ointment 0.1% twice daily to affected areas of skin as needed for redness or itching Discussed side effect of potent topical steroids including atrophy, dyspigmentation, striae, telangectasia, folliculitis, loss of skin pigment, hair growth, tachyphylaxis, risk of systemic absorption with missuse. Recommend daily compression stockings and elevation as often as possible  Level of service outlined above   Procedures,  orders, diagnosis for this visit:    There are no diagnoses linked to this encounter.  Return to clinic: Return if symptoms worsen or fail to improve.  I, Emerick Ege, CMA am acting as scribe for Lauraine JAYSON Kanaris, MD.   Documentation: I have reviewed the above documentation for accuracy and completeness, and I agree with the above.  Lauraine JAYSON Kanaris, MD

## 2023-12-14 ENCOUNTER — Ambulatory Visit: Admitting: Allergy

## 2023-12-23 ENCOUNTER — Ambulatory Visit: Admitting: Neurology

## 2023-12-23 DIAGNOSIS — G4733 Obstructive sleep apnea (adult) (pediatric): Secondary | ICD-10-CM | POA: Diagnosis not present

## 2023-12-27 ENCOUNTER — Ambulatory Visit: Admitting: Allergy & Immunology

## 2023-12-28 ENCOUNTER — Ambulatory Visit: Admitting: Nurse Practitioner

## 2024-01-19 ENCOUNTER — Other Ambulatory Visit: Payer: Self-pay

## 2024-01-19 ENCOUNTER — Ambulatory Visit: Admitting: Allergy & Immunology

## 2024-01-19 ENCOUNTER — Encounter: Payer: Self-pay | Admitting: Nurse Practitioner

## 2024-01-19 ENCOUNTER — Encounter: Payer: Self-pay | Admitting: Allergy & Immunology

## 2024-01-19 ENCOUNTER — Ambulatory Visit: Attending: Nurse Practitioner | Admitting: Nurse Practitioner

## 2024-01-19 VITALS — BP 158/90 | HR 86 | Temp 98.5°F | Ht 62.0 in | Wt 284.0 lb

## 2024-01-19 VITALS — BP 168/100 | HR 92 | Ht 63.0 in | Wt 283.4 lb

## 2024-01-19 DIAGNOSIS — Z6841 Body Mass Index (BMI) 40.0 and over, adult: Secondary | ICD-10-CM

## 2024-01-19 DIAGNOSIS — I10 Essential (primary) hypertension: Secondary | ICD-10-CM

## 2024-01-19 DIAGNOSIS — J310 Chronic rhinitis: Secondary | ICD-10-CM

## 2024-01-19 DIAGNOSIS — T783XXA Angioneurotic edema, initial encounter: Secondary | ICD-10-CM | POA: Diagnosis not present

## 2024-01-19 DIAGNOSIS — E785 Hyperlipidemia, unspecified: Secondary | ICD-10-CM | POA: Diagnosis not present

## 2024-01-19 NOTE — Patient Instructions (Signed)
 Medication Instructions:  - START carvedilol 3.125 mg twice daily   *If you need a refill on your cardiac medications before your next appointment, please call your pharmacy*  Lab Work: No labs ordered today  If you have labs (blood work) drawn today and your tests are completely normal, you will receive your results only by: MyChart Message (if you have MyChart) OR A paper copy in the mail If you have any lab test that is abnormal or we need to change your treatment, we will call you to review the results.  Testing/Procedures: No test ordered today   Follow-Up: At [facility], you and your health needs are our priority.  As part of our continuing mission to provide you with exceptional heart care, our providers are all part of one team.  This team includes your primary Cardiologist (physician) and Advanced Practice Providers or APPs (Physician Assistants and Nurse Practitioners) who all work together to provide you with the care you need, when you need it.  Your next appointment:   1 month(s)  Provider:   Redell Cave, MD or Lonni Meager, NP    We recommend signing up for the patient portal called MyChart.  Sign up information is provided on this After Visit Summary.  MyChart is used to connect with patients for Virtual Visits (Telemedicine).  Patients are able to view lab/test results, encounter notes, upcoming appointments, etc.  Non-urgent messages can be sent to your provider as well.   To learn more about what you can do with MyChart, go to [email or web address].

## 2024-01-19 NOTE — Patient Instructions (Addendum)
 1. Angioedema - unknown trigger - We are going to get some labs to look for weird causes of swelling.  - We are going to get some more answers when we do the skin testing at the next visit.   - Chronic hives are often times a self limited process and will burn themselves out over 6-12 months, although this is not always the case.  - In the meantime, start suppressive dosing of antihistamines:   - Morning: Allegra (fexofenadine) 360mg  (two tablets) OR Xyzal (levocetirizine) 10mg  (two tablets)  - Evening: Zyrtec (cetirizine) 20mg  (two tablets)  - If the above is not working, try adding: Pepcid (famotidine) 20mg  - You can change this dosing at home, decreasing the dose as needed or increasing the dosing as needed.  - If you are not tolerating the medications or are tired of taking them every day, we can start treatment with a monthly injectable medication called Xolair.   2. Chronic rhinitis - Because of insurance stipulations, we cannot do skin testing on the same day as your first visit. - We are all working to fight this, but for now we need to do two separate visits.  - We will know more after we do testing at the next visit.  - The skin testing visit can be squeezed in at your convenience.  - Then we can make a more full plan to address all of your symptoms. - Be sure to stop your antihistamines for 3 days before this appointment.   3. Return in about 2 weeks (around 02/02/2024) for SKIN TESTING (1-68). You can have the follow up appointment with Dr. Iva or a Nurse Practicioner (our Nurse Practitioners are excellent and always have Physician oversight!).    Please inform us  of any Emergency Department visits, hospitalizations, or changes in symptoms. Call us  before going to the ED for breathing or allergy symptoms since we might be able to fit you in for a sick visit. Feel free to contact us  anytime with any questions, problems, or concerns.  It was a pleasure to meet you  today!  Websites that have reliable patient information: 1. American Academy of Asthma, Allergy, and Immunology: www.aaaai.org 2. Food Allergy Research and Education (FARE): foodallergy.org 3. Mothers of Asthmatics: http://www.asthmacommunitynetwork.org 4. American College of Allergy, Asthma, and Immunology: www.acaai.org      Like us  on [employer] and Instagram for our latest updates!      A healthy democracy works best when [employer] participate! Make sure you are registered to vote! If you have moved or changed any of your contact information, you will need to get this updated before voting! Scan the QR codes below to learn more!

## 2024-01-19 NOTE — Progress Notes (Signed)
 NEW PATIENT  Date of Service/Encounter:  01/19/2024  Consult requested by: Katheen Roselie Rockford, NP   Assessment:   Angioedema  Chronic rhinitis  Professional clarinet player  Plan/Recommendations:   1. Angioedema - unknown trigger - We are going to get some labs to look for weird causes of swelling.  - We are going to get some more answers when we do the skin testing at the next visit.   - Chronic hives are often times a self limited process and will burn themselves out over 6-12 months, although this is not always the case.  - In the meantime, start suppressive dosing of antihistamines:   - Morning: Allegra (fexofenadine) 360mg  (two tablets) OR Xyzal (levocetirizine) 10mg  (two tablets)  - Evening: Zyrtec (cetirizine) 20mg  (two tablets)  - If the above is not working, try adding: Pepcid (famotidine) 20mg  - You can change this dosing at home, decreasing the dose as needed or increasing the dosing as needed.  - If you are not tolerating the medications or are tired of taking them every day, we can start treatment with a monthly injectable medication called Xolair.   2. Chronic rhinitis - Because of insurance stipulations, we cannot do skin testing on the same day as your first visit. - We are all working to fight this, but for now we need to do two separate visits.  - We will know more after we do testing at the next visit.  - The skin testing visit can be squeezed in at your convenience.  - Then we can make a more full plan to address all of your symptoms. - Be sure to stop your antihistamines for 3 days before this appointment.   3. Return in about 2 weeks (around 02/02/2024) for SKIN TESTING (1-68). You can have the follow up appointment with Dr. Iva or a Nurse Practicioner (our Nurse Practitioners are excellent and always have Physician oversight!).   This note in its entirety was forwarded to the Provider who requested this consultation.  Subjective:    Sherry [name] is a 58 y.o. female presenting today for evaluation of  Chief Complaint  Patient presents with   Establish Care   Rash   Urticaria   Pruritus   Angioedema    Sherry [name] has a history of the following: Patient Active Problem List   Diagnosis Date Noted   Hives 11/16/2023   Hypopigmentation 11/16/2023   Idiopathic peripheral neuropathy 09/09/2022   Hyperlipidemia associated with type 2 diabetes mellitus (HCC) 07/29/2022   DM (diabetes mellitus) (HCC) 07/27/2022   Dyspepsia 07/27/2022   Iron deficiency anemia due to chronic blood loss 07/27/2022   Colon cancer screening    Adenomatous polyp of sigmoid colon    Chronic bilateral low back pain without sciatica 07/08/2020   Flexural eczema 07/08/2020   Abnormal uterine bleeding 11/13/2018   Bilateral leg edema 09/22/2018   Fatigue 03/08/2018   Morbid obesity with BMI of 50.0-59.9, adult (HCC) 03/08/2018   EEG abnormality 02/22/2018   OSA (obstructive sleep apnea) 01/20/2018   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  Discussed the use of AI scribe software for clinical note transcription with the patient and/or guardian, who gave verbal consent to  proceed.  Sherry [name] Patient was referred by Katheen Roselie Rockford, NP.     Amairany is a 58 y.o. female presenting for an evaluation of angioedema.  She experiences allergic reactions characterized by swelling, redness, and small, non-itchy bumps that become itchy when they flare up. These symptoms resolve quickly once she returns indoors. The reactions occur primarily in the spring and summer and are not consistent every time she goes  outside.  She has lived in her current home for five years and suspects that a specific tree in her yard, which blooms in spring and summer, may be the trigger. She has not experienced these reactions inside her home or elsewhere outside.  She has not used any medications like Benadryl or Zyrtec for these reactions as they dissipate quickly. She keeps an EpiPen  at home but has not needed to use it. However, during a recent episode, she experienced significant swelling, including in her throat.  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 There is no discernible pattern to the frequency of her reactions, and she has not identified any specific triggers beyond the suspected tree. Her primary instrument is the clarinet. She does a lot of lessons, but she also serves in random     Allergic Rhinitis Symptom History: She does have a history of environmental allergies.  She has had these since she was a child.  She grew up in [place], [place] .  She has never been allergy tested in the past.  Food Allergy Symptom History: Her past medical history includes various allergic reactions throughout her life, including to certain foods and animals. She is a picky eater with a limited diet due to food reactions, including reactions to peanuts, cocoa, and certain meats. No shortness of breath or asthma symptoms.   Otherwise, there is no history of other atopic diseases, including drug allergies, stinging insect allergies, or contact dermatitis. There is no significant infectious history. Vaccinations are up to date.    Past Medical History: Patient Active Problem List   Diagnosis Date Noted   Hives 11/16/2023   Hypopigmentation 11/16/2023   Idiopathic peripheral neuropathy 09/09/2022   Hyperlipidemia associated with type 2 diabetes mellitus (HCC) 07/29/2022   DM (diabetes mellitus) (HCC) 07/27/2022   Dyspepsia 07/27/2022   Iron deficiency anemia due to chronic  blood loss 07/27/2022   Colon cancer screening    Adenomatous polyp of sigmoid colon    Chronic bilateral low back pain without sciatica 07/08/2020   Flexural eczema 07/08/2020   Abnormal uterine bleeding 11/13/2018   Bilateral leg edema 09/22/2018   Fatigue 03/08/2018   Morbid obesity with BMI of 50.0-59.9, adult (HCC) 03/08/2018   EEG abnormality 02/22/2018   OSA (obstructive sleep apnea) 01/20/2018   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 01/06/2018   History of pituitary surgery 12/14/2017   Breast mass, right 10/20/2017   Prolactinoma (HCC) 08/10/2011   Benign neoplasm of pituitary gland and craniopharyngeal duct (HCC) 02/01/1898   Hyperpituitarism 02/01/1898   Vitamin D  deficiency 02/01/1898    Medication List:  Allergies as of 01/19/2024       Reactions   Aspirin Rash, Nausea And Vomiting, Shortness Of Breath, Hives   Amlodipine  Other (See Comments)   Muscle pain   Chlorthalidone     Lack of focus   Irbesartan     Ankle swelling; prolonged menstruation   Lisinopril     acne   Maxzide [hydrochlorothiazide-triamterene ]    acne   Vitamin D  Analogs Other (See Comments)  Prescription strength pt counldn't focus, had the shakes and her cognitive skills were off balance    Shellfish Allergy Swelling, Rash        Medication List        Accurate as of January 19, 2024  1:12 PM. If you have any questions, ask your nurse or doctor.          bromocriptine 2.5 MG tablet Commonly known as: PARLODEL Take 2.5 mg by mouth at bedtime. The timing of this medication is very important.   carvedilol 3.125 MG tablet Commonly known as: COREG Take 1 tablet (3.125 mg total) by mouth 2 (two) times daily. Started by: Lonni Meager, NP   EPINEPHrine  0.3 mg/0.3 mL Soaj injection Commonly known as: EpiPen  2-Pak Inject 0.3 mg into the muscle as needed for anaphylaxis.   Mini Omega-3 Burp-Less 540 MG Caps Take 540 mg by mouth at bedtime.   MULTIVITAMIN ADULT  PO Take 1 tablet by mouth at bedtime.   triamcinolone  cream 0.5 % Commonly known as: KENALOG  APPLY ONE (1) APPLICATION TOPICALLY TWICE DAILY   triamcinolone  ointment 0.1 % Commonly known as: KENALOG  Apply 1 gram twice daily to affected areas of skin. Stop once resolved and restart as needed for flares. Avoid use on face, armpits, groin unless otherwise indicated.   Vitamin D  50 MCG (2000 UT) tablet Take 2,000 Units by mouth at bedtime.        Birth History: non-contributory  Developmental History: non-contributory  Past Surgical History: Past Surgical History:  Procedure Laterality Date   COLONOSCOPY WITH PROPOFOL  N/A 11/23/2021   Procedure: COLONOSCOPY WITH PROPOFOL ;  Surgeon: Eda Iha, MD;  Location: [facility];  Service: Gastroenterology;  Laterality: N/A;   POLYPECTOMY  11/23/2021   Procedure: POLYPECTOMY;  Surgeon: Eda Iha, MD;  Location: [facility];  Service: Gastroenterology;;   TRANSPHENOIDAL PITUITARY RESECTION  1996   benign pituitary adenoma   WISDOM TOOTH EXTRACTION       Family History: Family History  Problem Relation Age of Onset   Heart disease Mother    Kidney disease Mother    Hypertension Mother    Uterine cancer Mother 48       sarcoma   Heart attack Father 25       result of DVT   Pulmonary embolism Father    Hypertension Maternal Aunt    CVA Maternal Aunt 5   Stroke Maternal Aunt 70   Hypertension Maternal Uncle    Breast cancer Maternal Grandmother    Leukemia Maternal Grandmother    Colon cancer Neg Hx    Esophageal cancer Neg Hx    Liver cancer Neg Hx    Stomach cancer Neg Hx      Social History: Sherry [name] lives at home with her family.  She lives in a house.  It is 58 years old.  There is hardwood throughout the home.  She has gas heating and central cooling.  There are no animals inside or outside of the home.  There are dust mite covers in the bedding.  There is no tobacco exposure.  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  There is no fume, chemical, or dust exposure.  There is no HEPA filter in the home.  She does not live near an interstate or industrial area.  There is no vaping exposure.   Review of systems otherwise negative other than that mentioned in the HPI.    Objective:   Blood pressure (!) 158/90, pulse  86, temperature 98.5 F (36.9 C), height 5' 2 (1.575 m), weight 284 lb (128.8 kg), SpO2 99%. Body mass index is 51.94 kg/m.     Physical Exam Vitals reviewed.  Constitutional:      Appearance: She is well-developed.     Comments: Friendly.  HENT:     Head: Normocephalic and atraumatic.     Right Ear: Tympanic membrane, ear canal and external ear normal. No drainage, swelling or tenderness. Tympanic membrane is not injected, scarred, erythematous, retracted or bulging.     Left Ear: Tympanic membrane, ear canal and external ear normal. No drainage, swelling or tenderness. Tympanic membrane is not injected, scarred, erythematous, retracted or bulging.     Nose: No nasal deformity, septal deviation, mucosal edema or rhinorrhea.     Right Turbinates: Enlarged, swollen and pale.     Left Turbinates: Enlarged, swollen and pale.     Right Sinus: No maxillary sinus tenderness or frontal sinus tenderness.     Left Sinus: No maxillary sinus tenderness or frontal sinus tenderness.     Mouth/Throat:     Mouth: Mucous membranes are not pale and not dry.     Pharynx: Uvula midline.  Eyes:     General:        Right eye: No discharge.        Left eye: No discharge.     Conjunctiva/sclera: Conjunctivae normal.     Right eye: Right conjunctiva is not injected. No chemosis.    Left eye: Left conjunctiva is not injected. No chemosis.    Pupils: Pupils are equal, round, and reactive to light.  Cardiovascular:     Rate and Rhythm: Normal rate and regular rhythm.     Heart sounds: Normal heart sounds.  Pulmonary:     Effort: Pulmonary effort is normal. No tachypnea,  accessory muscle usage or respiratory distress.     Breath sounds: Normal breath sounds. No wheezing, rhonchi or rales.     Comments: Moving air well in all lung fields.  No increased work of breathing. Chest:     Chest wall: No tenderness.  Abdominal:     Tenderness: There is no abdominal tenderness. There is no guarding or rebound.  Lymphadenopathy:     Head:     Right side of head: No submandibular, tonsillar or occipital adenopathy.     Left side of head: No submandibular, tonsillar or occipital adenopathy.     Cervical: No cervical adenopathy.  Skin:    General: Skin is warm.     Capillary Refill: Capillary refill takes less than 2 seconds.     Coloration: Skin is not pale.     Findings: No abrasion, erythema, petechiae or rash. Rash is not papular, urticarial or vesicular.     Comments: No eczematous or urticarial lesions noted.  Neurological:     Mental Status: She is alert.  Psychiatric:        Behavior: Behavior is cooperative.      Diagnostic studies: labs sent instead        Marty Shaggy, MD [facility] 

## 2024-01-19 NOTE — Progress Notes (Signed)
 "    [facility]   HOME SLEEP TEST REPORT ( by Elene  mail -out device )    Sherry [name] Patient 58 year old female 1965-09-17    STUDY DATE:  12-23-2023  Data received :  01-19-2024   ORDERING CLINICIAN/ REFERRING CLINICIAN:  Greig Forbes, NP  Followed by Dr Katheen and by Allergy and Asthma   CLINICAL INFORMATION/HISTORY: 58 year old female with primary HTN,  OSA on CPAP and hyperprolactinemia, uterine bleeding, angioedema.  CPAP  5-13 cm water, 2 cm EPR and AHI of 1.6/h. 95% pressure is 10.5 cm water.     Epworth sleepiness score: ESS: 7/24, previously 11/24  FFS at  XYZ / 63 points   BMI: 50.5  kg/m  Neck Circumference: N/A  Sleep Summary:   Total Recording Time (hours, min):    9 h 21 minutes     Total Sleep Time (hours, min):   4 h 29 minutes   Sleep efficiency %;    51 %                                    Respiratory Indices by AASM criteria of scoring- Calculated pAHI (per hour):    30.8/h                                            Positional  respiratory activity  / snoring :   Oxygen Saturation  in Sleep    Oxygen Saturation (%) Mean:     90.6%               O2 Saturation Range (%):   56.7 % and 98.3%                                     O2 Saturation (minutes) <89%:    37 minutes (!!!)         Pulse Rate in Sleep :   Pulse Mean (bpm):   89 bpm   ( high frequency )             Pulse Range:  79 and 110 bpm                 IMPRESSION:  This HST confirms the presence of  severe obstructive sleep apnea and severer sleep hypoxia . The high sleep fragmentation also illustrated  the patient's dependency on CPAP for better sleep quality.    RECOMMENDATION: This patient will need to continue with CPAP therapy and a new auto-titration device will be ordered:   ResMed S 11 : CPAP  5-13 cm water, 2 cm EPR  , mask of patient's  preference.  Follow up with ONO while on CPAP therapy.  Weight loss therapy is best treatment for OSA  for the long term.    Any Patient  endorsing a high level of sleepiness should be cautioned not to drive, work at heights, or operate dangerous machinery or heavy equipment when tired or sleepy.  Review of good sleep hygiene measures took place in the initial consultation but should be revisited ( Your guide to better sleep  a publication by the NIH is a good source of information).   The referring provider will  be notified of the test results.    I certify that I have reviewed the raw data recording prior to the issuance of this report in accordance with the standards of the American Academy of Sleep Medicine (AASM).    INTERPRETING PHYSICIAN:   Dedra Gores, MD  [facility] Sleep Board certified by The [employer] of Sleep Medicine and Diplomate of the [employer] of Sleep Medicine. Board certified In Neurology through the ABPN, Fellow of the [employer] of Neu[employer].  '         "

## 2024-01-19 NOTE — Progress Notes (Signed)
 Office Visit    Patient Name: Sherry [name] Date of Encounter: 01/19/2024  Primary Care Provider:  Katheen Roselie Rockford, NP Primary Cardiologist:  Redell Cave, MD    Chief Complaint    58 y.o. female with a history of hypertension, obesity, obstructive sleep apnea on CPAP, and pituitary adenoma status post excision in 1996, who presents for hypertension follow-up.   Past Medical History   Subjective   Past Medical History:  Diagnosis Date   Anemia    hx of years ago   Brain tumor (benign) (HCC) 1996   benign pituitary adenoma   Chickenpox    Diastolic dysfunction    a. 10/2019 Echo: EF 55-60%, no rwma, GrI DD, nl RV fxn, RVSP 34.39mmHg. Mildly dil RA. Triv MR.   Hyperlipidemia    Hypertension    OSA (obstructive sleep apnea)    cpap night seizures   Past Surgical History:  Procedure Laterality Date   COLONOSCOPY WITH PROPOFOL  N/A 11/23/2021   Procedure: COLONOSCOPY WITH PROPOFOL ;  Surgeon: Eda Iha, MD;  Location: [facility];  Service: Gastroenterology;  Laterality: N/A;   POLYPECTOMY  11/23/2021   Procedure: POLYPECTOMY;  Surgeon: Eda Iha, MD;  Location: [facility];  Service: Gastroenterology;;   TRANSPHENOIDAL PITUITARY RESECTION  1996   benign pituitary adenoma   WISDOM TOOTH EXTRACTION      Allergies  Allergies[1]     History of Present Illness      58 y.o. y/o female with a history of hypertension, obesity, obstructive sleep apnea on CPAP, and pituitary adenoma status post excision in 1996.  Following excision of pituitary adenoma, she developed neurological issues with lack of focus and memory impairment.  She was diagnosed with hypertension in 2018 and was initially placed on amlodipine  but developed myalgias and this was stopped.  She was later started on lisinopril  and Maxide but developed worsening facial acne that resolved following discontinuation of both.  She established care with Dr. Cave in March 2023 due to  untreated hypertension and prior medication intolerances.  She was placed on irbesartan  150 mg daily which she felt resulted in changes to her menstruation and also ankle swelling.  This was discontinued and she was placed on chlorthalidone , which she discontinued on her own in August 2025 due to complaints of feeling unfocused.   Sherry [name] was last seen in cardiology clinic in September 2025, at which time she was hypertensive.  She also reported elevated pressures at home, usually trending in the 140s to 150s.  She was offered antihypertensive therapy but declined in favor of further lifestyle modifications.  At her neurology follow-up in November, blood pressure was 180/115.  Today, Sherry [name] says that blood pressure typically runs greater than 148 at home, and often into the 170s.  She notes that she has been less active over the past month or more as it has been the busy season for musical performances.  She is not exercising.  She denies chest pain, dyspnea, palpitations, PND, orthopnea, dizziness, syncope, or early satiety.  She continues to note mild ankle swelling, especially at the end of the day.  Objective   Home Medications    Current Outpatient Medications  Medication Sig Dispense Refill   bromocriptine (PARLODEL) 2.5 MG tablet Take 2.5 mg by mouth at bedtime.     Cholecalciferol (VITAMIN D ) 50 MCG (2000 UT) tablet Take 2,000 Units by mouth at bedtime.     EPINEPHrine  (EPIPEN  2-PAK) 0.3 mg/0.3 mL IJ SOAJ injection Inject 0.3 mg  into the muscle as needed for anaphylaxis. 2 each 0   Multiple Vitamins-Minerals (MULTIVITAMIN ADULT PO) Take 1 tablet by mouth at bedtime.     Omega-3 Fatty Acids (MINI OMEGA-3 BURP-LESS) 540 MG CAPS Take 540 mg by mouth at bedtime.     triamcinolone  cream (KENALOG ) 0.5 % APPLY ONE (1) APPLICATION TOPICALLY TWICE DAILY 454 g 0   triamcinolone  ointment (KENALOG ) 0.1 % Apply 1 gram twice daily to affected areas of skin. Stop once resolved and restart as needed  for flares. Avoid use on face, armpits, groin unless otherwise indicated. 30 g 5   No current facility-administered medications for this visit.     Physical Exam    VS:  BP (!) 168/100 (BP Location: Left Arm, Patient Position: Sitting, Cuff Size: Large)   Pulse 92   Ht 5' 3 (1.6 m)   Wt 283 lb 6 oz (128.5 kg)   BMI 50.20 kg/m  , BMI Body mass index is 50.2 kg/m.          GEN: obese, in no acute distress. HEENT: normal. Neck: Supple, obese, difficult to gauge JVP due to body habitus.  No carotid bruits or masses. Cardiac: RRR, no murmurs, rubs, or gallops. No clubbing, cyanosis, trace bilat ankle edema.  Radials 2+/PT 2+ and equal bilaterally.  Respiratory:  Respirations regular and unlabored, clear to auscultation bilaterally. GI: Soft, nontender, nondistended, BS + x 4. MS: no deformity or atrophy. Skin: warm and dry, no rash. Neuro:  Strength and sensation are intact. Psych: Normal affect.  Accessory Clinical Findings     Lab Results  Component Value Date   WBC 9.6 11/16/2023   HGB 14.7 11/16/2023   HCT 44.8 11/16/2023   MCV 93.5 11/16/2023   PLT 292.0 11/16/2023   Lab Results  Component Value Date   CREATININE 0.85 11/16/2023   BUN 8 11/16/2023   NA 141 11/16/2023   K 3.4 (L) 11/16/2023   CL 103 11/16/2023   CO2 26 11/16/2023   Lab Results  Component Value Date   ALT 11 11/16/2023   AST 16 11/16/2023   ALKPHOS 96 11/16/2023   BILITOT 0.7 11/16/2023   Lab Results  Component Value Date   CHOL 222 (H) 11/16/2023   HDL 57.90 11/16/2023   LDLCALC 150 (H) 11/16/2023   TRIG 75.0 11/16/2023   CHOLHDL 4 11/16/2023    Lab Results  Component Value Date   HGBA1C 6.2 11/16/2023   Lab Results  Component Value Date   TSH 1.641 10/25/2022       Assessment & Plan    1.  Primary hypertension: Historically difficult to manage in the setting of multiple drug intolerances including amlodipine , irbesartan , lisinopril , Maxide, and chlorthalidone .  Earlier this  year, she committed to lifestyle modifications in effort to avoid medications however, this has been difficult to maintain and thus far, she has not lost any significant weight.  Blood pressure remains elevated today at 160/100 and she notes that she typically trends in the high 140s to 180s at home.  We do long discussion today about blood pressure management and long-term risk.  She is now willing to accept a prescription for carvedilol 3.125 mg twice daily.  I strongly encouraged her to continue to work on lifestyle modifications including 30 minutes of moderate activity at least 5 days a week and continued avoidance of processed foods and more focus on a plant-based diet.  2.  Hyperlipidemia: Recent lipids in October 2025 with total cholesterol 222, LDL  150.  She was offered atorvastatin therapy by primary care but declined.  10-year risk of MACE calculates to approximately 23.  We discussed this at length today.  As we are adding carvedilol, I would not attempt to add a second new medication for her given long history of intolerances but we did discuss how we will be important to manage her cholesterol going forward.  Again, I strongly encouraged lifestyle modifications but reiterated that in the absence of significant change, a statin or alternate cholesterol medication should be in her future.  3.  Morbid obesity: Has not lost significant weight.  283 pounds today.  As above, discussed importance of lifestyle modifications, regular exercise, and a more focused plant-based diet.  4.  Hypokalemia: Potassium 3.4 in October.  Followed by primary care with recommendation for high potassium diet.  5.  Disposition: Follow-up in 1 month or sooner if necessary.  Lonni Meager, NP 01/19/2024, 8:06 AM     [1]  Allergies Allergen Reactions   Aspirin Rash, Nausea And Vomiting, Shortness Of Breath and Hives   Amlodipine  Other (See Comments)    Muscle pain   Chlorthalidone      Lack of focus    Irbesartan      Ankle swelling; prolonged menstruation   Lisinopril      acne   Maxzide [Hydrochlorothiazide-Triamterene ]     acne   Vitamin D  Analogs Other (See Comments)    Prescription strength pt counldn't focus, had the shakes and her cognitive skills were off balance    Shellfish Allergy Swelling and Rash

## 2024-01-24 ENCOUNTER — Ambulatory Visit: Payer: Self-pay | Admitting: Allergy & Immunology

## 2024-01-25 LAB — TRYPTASE: Tryptase: 6.7 ug/L (ref 2.2–13.2)

## 2024-01-25 LAB — C1 ESTERASE INHIBITOR, FUNCTIONAL: C1INH Functional/C1INH Total MFr SerPl: >105 %{normal}

## 2024-01-25 LAB — C1 ESTERASE INHIBITOR: C1INH SerPl-mCnc: 40 mg/dL — ABNORMAL HIGH (ref 21–39)

## 2024-01-25 LAB — C3 AND C4
Complement C3, Serum: 167 mg/dL (ref 82–167)
Complement C4, Serum: 29 mg/dL (ref 12–38)

## 2024-01-25 LAB — COMPLEMENT COMPONENT C1Q: Complement C1Q: 15.8 mg/dL (ref 10.3–20.5)

## 2024-01-30 NOTE — Progress Notes (Signed)
 Called and left a voicemail message for a return call to the office.

## 2024-01-31 ENCOUNTER — Other Ambulatory Visit: Payer: Self-pay | Admitting: Neurology

## 2024-01-31 DIAGNOSIS — G4733 Obstructive sleep apnea (adult) (pediatric): Secondary | ICD-10-CM

## 2024-01-31 DIAGNOSIS — I152 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: Secondary | ICD-10-CM

## 2024-01-31 NOTE — Procedures (Signed)
 [facility]   HOME SLEEP TEST REPORT ( by Elene  mail -out device )    Sherry [name] 58 year old female [birth year]    STUDY DATE:  12-23-2023  Data received :  01-19-2024   ORDERING CLINICIAN/ REFERRING CLINICIAN:  Greig Forbes, NP  Followed by Dr Katheen and by Allergy and Asthma   CLINICAL INFORMATION/HISTORY: 58 year old female with primary HTN,  OSA on CPAP and hyperprolactinemia, uterine bleeding, angioedema.  CPAP  5-13 cm water, 2 cm EPR and AHI of 1.6/h. 95% pressure is 10.5 cm water.     Epworth sleepiness score: ESS: 7/24, previously 11/24  FFS at  XYZ / 63 points   BMI: 50.5  kg/m  Neck Circumference: N/A  Sleep Summary:   Total Recording Time (hours, min):    9 h 21 minutes     Total Sleep Time (hours, min):   4 h 29 minutes   Sleep efficiency %;    51 %                                    Respiratory Indices by AASM criteria of scoring- Calculated pAHI (per hour):    30.8/h                                            Positional  respiratory activity  / snoring :   Oxygen Saturation  in Sleep    Oxygen Saturation (%) Mean:     90.6%               O2 Saturation Range (%):   56.7 % and 98.3%                                     O2 Saturation (minutes) <89%:    37 minutes (!!!)         Pulse Rate in Sleep :   Pulse Mean (bpm):   89 bpm   ( high frequency )             Pulse Range:  79 and 110 bpm                 IMPRESSION:  This HST confirms the presence of  severe obstructive sleep apnea and severer sleep hypoxia . The high sleep fragmentation also illustrated  the [name]'s dependency on CPAP for better sleep quality.    RECOMMENDATION: This [name] will need to continue with CPAP therapy and a new auto-titration device will be ordered:   ResMed S 11 : CPAP  5-13 cm water, 2 cm EPR  , mask of [name]'s  preference.  Follow up with ONO while on CPAP therapy.  Weight loss therapy is best treatment for OSA  for the long term.    [name]  endorsing a high level of sleepiness should be cautioned not to drive, work at heights, or operate dangerous machinery or heavy equipment when tired or sleepy.  Review of good sleep hygiene measures took place in the initial consultation but should be revisited ( Your guide to better sleep  a publication by the NIH is a good source of information).   The referring provider will be notified of the  test results.    I certify that I have reviewed the raw data recording prior to the issuance of this report in accordance with the standards of the American Academy of Sleep Medicine (AASM).    INTERPRETING PHYSICIAN:   Dedra Gores, MD  [facility] Sleep Board certified by The [employer] of Sleep Medicine and Diplomate of the [employer] of Sleep Medicine. Board certified In Neurology through the ABPN, Fellow of the [employer] of Neu[employer].  '

## 2024-02-07 ENCOUNTER — Ambulatory Visit: Payer: Self-pay | Admitting: Family Medicine

## 2024-02-09 ENCOUNTER — Ambulatory Visit: Admitting: Allergy & Immunology

## 2024-02-14 NOTE — Telephone Encounter (Addendum)
 Called pt and let her know the Below Sleep Study Results per NP, Amy Lomax.   ----- Message from Nurse Heather BROCKS, RN sent at 02/08/2024  7:41 AM EST -----  ----- Message ----- From: Cary No, NP Sent: 02/07/2024   4:18 PM EST To: Gna-Pod 1 Results  Please let her know that her sleep study showed continued sleep apnea. Dr Dohmeier ordered her a new machine. Once she gets her new machine, we will see her back in 31-90 days.

## 2024-03-07 ENCOUNTER — Ambulatory Visit: Admitting: Nurse Practitioner

## 2024-03-07 ENCOUNTER — Encounter: Payer: Self-pay | Admitting: Nurse Practitioner

## 2024-03-07 VITALS — BP 172/102 | HR 87 | Ht 62.0 in | Wt 291.2 lb

## 2024-03-07 DIAGNOSIS — Z6841 Body Mass Index (BMI) 40.0 and over, adult: Secondary | ICD-10-CM | POA: Diagnosis not present

## 2024-03-07 DIAGNOSIS — I10 Essential (primary) hypertension: Secondary | ICD-10-CM | POA: Diagnosis not present

## 2024-03-07 DIAGNOSIS — G4733 Obstructive sleep apnea (adult) (pediatric): Secondary | ICD-10-CM

## 2024-03-07 DIAGNOSIS — E785 Hyperlipidemia, unspecified: Secondary | ICD-10-CM | POA: Diagnosis not present

## 2024-03-07 MED ORDER — SPIRONOLACTONE 25 MG PO TABS: 12.5000 mg | ORAL_TABLET | Freq: Every day | ORAL | 0 refills | Status: AC

## 2024-03-07 NOTE — Progress Notes (Signed)
 "    Office Visit    Patient Name: Sherry [name] Date of Encounter: 03/07/2024  Primary Care Provider:  Katheen Roselie Rockford, NP Primary Cardiologist:  Redell Cave, MD    Chief Complaint    59 y.o. female with a history of hypertension, obesity, obstructive sleep apnea on CPAP, and pituitary adenoma status post excision in 1996, who presents for hypertension follow-up.   Past Medical History   Subjective   Past Medical History:  Diagnosis Date   Anemia    hx of years ago   Brain tumor (benign) (HCC) 1996   benign pituitary adenoma   Chickenpox    Diastolic dysfunction    a. 10/2019 Echo: EF 55-60%, no rwma, GrI DD, nl RV fxn, RVSP 34.74mmHg. Mildly dil RA. Triv MR.   Hyperlipidemia    Hypertension    OSA (obstructive sleep apnea)    cpap night seizures   Past Surgical History:  Procedure Laterality Date   COLONOSCOPY WITH PROPOFOL  N/A 11/23/2021   Procedure: COLONOSCOPY WITH PROPOFOL ;  Surgeon: Eda Iha, MD;  Location: [facility];  Service: Gastroenterology;  Laterality: N/A;   POLYPECTOMY  11/23/2021   Procedure: POLYPECTOMY;  Surgeon: Eda Iha, MD;  Location: [facility];  Service: Gastroenterology;;   TRANSPHENOIDAL PITUITARY RESECTION  1996   benign pituitary adenoma   WISDOM TOOTH EXTRACTION      Allergies  Allergies[1]     History of Present Illness      59 y.o. y/o female with a history of hypertension, obesity, obstructive sleep apnea on CPAP, and pituitary adenoma status post excision in 1996.  Following excision of pituitary adenoma, she developed neurological issues with lack of focus and memory impairment.  She was diagnosed with hypertension in 2018 and was initially placed on amlodipine  but developed myalgias and this was stopped.  She was later started on lisinopril  and Maxide but developed worsening facial acne that resolved following discontinuation of both.  She established care with Dr. Cave in March 2023 due to  untreated hypertension and prior medication intolerances.  She was placed on irbesartan  150 mg daily which she felt resulted in changes to her menstruation and also ankle swelling.  This was discontinued and she was placed on chlorthalidone , which she discontinued on her own in August 2025 due to complaints of feeling unfocused.  She accepted a Rx for carvedilol in 01/2024.   She presents today for hypertension follow up and reports side effects of facial swelling and blurred vision/lack of vision focus after 2 doses of carvedilol 3.125mg , along with BP elevation to 190s systolic. She stopped medication after this. Since discontinuation of carvedilol, she notes her BPs trended down to 150s systolic but remain elevated. She mentions that with all previously tried antihypertensives she noticed dark urine. She does not have a routine exercise regimen however has a gym in garage and plans to start exercising with a friend. Diet is varied, with avoidance of added salt. Notes several foods do not agree with her. Her first meal is generally in the afternoon. Wears CPAP nightly. She denies chest pain, shortness of breath, palpitations, headaches, vision changes except what was noted after carvedilol dose. Has stable bilateral LE edema and wears compression stockings, elevates legs.   ROS: all other systems reviewed and negative unless otherwise noted.   Objective   The 10-year ASCVD risk score (Arnett DK, et al., 2019) is: 34.1%   Values used to calculate the score:     Age: 10 years  Clinically relevant sex: Female     Is Non-Hispanic African American: Yes     Diabetic: Yes     Tobacco smoker: No     Systolic Blood Pressure: 172 mmHg     Is BP treated: Yes     HDL Cholesterol: 57.9 mg/dL     Total Cholesterol: 222 mg/dL  Home Medications    Current Outpatient Medications  Medication Sig Dispense Refill   bromocriptine (PARLODEL) 2.5 MG tablet Take 2.5 mg by mouth at bedtime.     Cholecalciferol  (VITAMIN D ) 50 MCG (2000 UT) tablet Take 2,000 Units by mouth at bedtime.     EPINEPHrine  (EPIPEN  2-PAK) 0.3 mg/0.3 mL IJ SOAJ injection Inject 0.3 mg into the muscle as needed for anaphylaxis. 2 each 0   Multiple Vitamins-Minerals (MULTIVITAMIN ADULT PO) Take 1 tablet by mouth at bedtime.     Omega-3 Fatty Acids (MINI OMEGA-3 BURP-LESS) 540 MG CAPS Take 540 mg by mouth at bedtime.     spironolactone  (ALDACTONE ) 25 MG tablet Take 0.5 tablets (12.5 mg total) by mouth daily. 45 tablet 0   triamcinolone  cream (KENALOG ) 0.5 % APPLY ONE (1) APPLICATION TOPICALLY TWICE DAILY 454 g 0   triamcinolone  ointment (KENALOG ) 0.1 % Apply 1 gram twice daily to affected areas of skin. Stop once resolved and restart as needed for flares. Avoid use on face, armpits, groin unless otherwise indicated. 30 g 5   carvedilol (COREG) 3.125 MG tablet Take 1 tablet (3.125 mg total) by mouth 2 (two) times daily. (Patient not taking: Reported on 03/07/2024) 180 tablet 3   No current facility-administered medications for this visit.     Physical Exam    VS:  BP (!) 172/102 (BP Location: Left Arm, Patient Position: Sitting, Cuff Size: Large)   Pulse 87   Ht 5' 2 (1.575 m)   Wt 291 lb 3.2 oz (132.1 kg)   SpO2 98%   BMI 53.26 kg/m  , BMI Body mass index is 53.26 kg/m.          GEN: Well nourished, well developed, in no acute distress. HEENT: normal. Neck: Supple, no JVD, carotid bruits, or masses. Cardiac: RRR, no murmurs, rubs, or gallops. No clubbing, cyanosis, trace bilateral LE edema.  Radials 2+/PT 2+ and equal bilaterally.  Respiratory:  Respirations regular and unlabored, clear to auscultation bilaterally. GI: Soft, nontender, nondistended, BS + x 4. MS: no deformity or atrophy. Skin: warm and dry, no rash. Neuro:  Strength and sensation are intact. Psych: Normal affect.  Accessory Clinical Findings    ECG personally reviewed by me today - EKG Interpretation Date/Time:  Wednesday March 07 2024 09:14:25  EST Ventricular Rate:  87 PR Interval:  154 QRS Duration:  82 QT Interval:  384 QTC Calculation: 462 R Axis:   -1  Text Interpretation: Normal sinus rhythm Minimal voltage criteria for LVH, may be normal variant ( R in aVL ) Cannot rule out Anterior infarct (cited on or before 14-Sep-2023) Confirmed by Vivienne Bruckner [phone]) on 03/07/2024 9:23:23 AM   - no acute changes.  Lab Results  Component Value Date   WBC 9.6 11/16/2023   HGB 14.7 11/16/2023   HCT 44.8 11/16/2023   MCV 93.5 11/16/2023   PLT 292.0 11/16/2023   Lab Results  Component Value Date   CREATININE 0.85 11/16/2023   BUN 8 11/16/2023   NA 141 11/16/2023   K 3.4 (L) 11/16/2023   CL 103 11/16/2023   CO2 26 11/16/2023   Lab Results  Component Value Date   ALT 11 11/16/2023   AST 16 11/16/2023   ALKPHOS 96 11/16/2023   BILITOT 0.7 11/16/2023   Lab Results  Component Value Date   CHOL 222 (H) 11/16/2023   HDL 57.90 11/16/2023   LDLCALC 150 (H) 11/16/2023   TRIG 75.0 11/16/2023   CHOLHDL 4 11/16/2023    Lab Results  Component Value Date   HGBA1C 6.2 11/16/2023   Lab Results  Component Value Date   TSH 1.641 10/25/2022       Assessment & Plan    1. HTN - BP elevated above goal by in office and home BP readings, ranging from 150s-190s systolic. She has difficult to control hypertension due to medication intolerances, including: amlodipine , irbesartan , lisinopril , Maxide, chlorthalidone , and most recently carvedilol. We discussed long-term impact of uncontrolled hypertension, along with importance of diet and lifestyle modifications. Discussed medication options of: spironolactone , hydralazine, clonidine, of which multiple daily doses with hydralazine and anticholingeric-like side effects of clonidine are not preferred. Labs 11/2023: BUN/Creat 8/0.85 and K+ 3.4.  # Initiate spironolactone  12.5mg  daily # BMET in 1 week # Educated on most common possible side effects, along with benefit of potentially  decreasing LE edema and improving hypokalemia #She is on parlodel long term, a dopamine receptor agonist, which could contribute to HTN.  #We did briefly discuss consideration for renal denervation at some point if we are unable to manage BP due to ongoing intolerances.    2. Morbid Obesity - Weight increased 8lbs from last visit 01/2024. Discussed importance of weight loss with incorporating lifestyle modifications of diet and exercise and how this can positively impact BP. 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 Discussed timing of meals and metabolism. Plans to start exercising w/ fellow music student.  She has a treadmill in her garage.  3. HLD - Lab work 11/2023 with TC 222, LDL 150, A1c 6.2%. Was offered statin which she declined, opted for lifestyle modifications. 10-year ASCVD risk 34.1%.Reinforced lifestyle modifications as noted above. Has follow up labs with PCP in April 2026.   4. OSA - Uses CPAP routinely per report. HST 12/2023 with AHI 30.8, severe OSA/sleep hypoxia. Encouraged weight loss and BP control. Following with neurology.  Disp: BMET in 1 wk.  F/u 3 months.  Pt to provide BP response/feedback via mychart prior to 3 mos mark.   Lonni Meager, NP 03/07/2024, 11:50 AM     [1]  Allergies Allergen Reactions   Aspirin Rash, Nausea And Vomiting, Shortness Of Breath and Hives   Amlodipine  Other (See Comments)    Muscle pain Dark urine.   Carvedilol     Facial swelling and blurry vision. Dark urine.   Chlorthalidone      Lack of focus Dark urine.   Irbesartan      Ankle swelling; prolonged menstruation Dark urine.   Lisinopril      Acne Dark urine.   Maxzide [Hydrochlorothiazide-Triamterene ]     Acne Dark urine.   Vitamin D  Analogs Other (See Comments)    Prescription strength pt counldn't focus, had the shakes and her cognitive skills were off balance    Shellfish Allergy Swelling and Rash   "

## 2024-03-07 NOTE — Patient Instructions (Addendum)
 Medication Instructions:  START Spironolactone  12.5 mg once daily (half a tablet)  *If you need a refill on your cardiac medications before your next appointment, please call your pharmacy*  Lab Work: Your provider would like for you to return in one week to have the following labs drawn: BMET.   Please go to [facility] [address] ([facility] [address], [place] [address] You do not need an appointment.  They are open from 8 am- 4:30 pm.  Lunch from 1:00 pm- 2:00 pm You will not need to be fasting.   You may also go to one of the following [facility]:  [address] [place], [place] [address] Phone: [phone] Lab hours: Mon-Fri 8 am- 5 pm    Lunch 12 pm- 1 pm  [address] [place],  [place]  [address]  [place]  Phone: [phone] Lab hours: 7 am- 4 pm Lunch 12 pm-1 pm   [address] [place],  [place]  [address]  [place]  Phone: [phone] Lab hours: Mon-Fri 8 am- 5 pm    Lunch 12 pm- 1 pm  If you have labs (blood work) drawn today and your tests are completely normal, you will receive your results only by: MyChart Message (if you have MyChart) OR A paper copy in the mail If you have any lab test that is abnormal or we need to change your treatment, we will call you to review the results.  Testing/Procedures: None ordered  Follow-Up: At [facility], you and your health needs are our priority.  As part of our continuing mission to provide you with exceptional heart care, our providers are all part of one team.  This team includes your primary Cardiologist (physician) and Advanced Practice Providers or APPs (Physician Assistants and Nurse Practitioners) who all work together to provide you with the care you need, when you need it.  Your next appointment:   3 month(s)  Provider:   Redell Cave, MD or Lonni Meager, NP    We recommend signing up for the patient portal called MyChart.  Sign up information is provided on this After  Visit Summary.  MyChart is used to connect with patients for Virtual Visits (Telemedicine).  Patients are able to view lab/test results, encounter notes, upcoming appointments, etc.  Non-urgent messages can be sent to your provider as well.   To learn more about what you can do with MyChart, go to [email or web address].

## 2024-05-16 ENCOUNTER — Ambulatory Visit: Admitting: Nurse Practitioner

## 2024-06-06 ENCOUNTER — Ambulatory Visit: Admitting: Nurse Practitioner
